# Patient Record
Sex: Female | Born: 1935 | Race: White | Hispanic: No | Smoking: Never smoker
Health system: Southern US, Community
[De-identification: ages and names within clinical notes are randomized; demographics above are authoritative.]

## PROBLEM LIST (undated history)

## (undated) DIAGNOSIS — F0390 Unspecified dementia without behavioral disturbance: Secondary | ICD-10-CM

## (undated) DIAGNOSIS — M8580 Other specified disorders of bone density and structure, unspecified site: Secondary | ICD-10-CM

## (undated) DIAGNOSIS — I639 Cerebral infarction, unspecified: Secondary | ICD-10-CM

## (undated) DIAGNOSIS — E785 Hyperlipidemia, unspecified: Secondary | ICD-10-CM

## (undated) DIAGNOSIS — N390 Urinary tract infection, site not specified: Secondary | ICD-10-CM

## (undated) DIAGNOSIS — N189 Chronic kidney disease, unspecified: Secondary | ICD-10-CM

## (undated) HISTORY — PX: TOTAL HIP ARTHROPLASTY: SHX124

## (undated) HISTORY — PX: REPLACEMENT TOTAL KNEE: SUR1224

## (undated) HISTORY — PX: BREAST LUMPECTOMY: SHX2

## (undated) HISTORY — PX: ABDOMINAL HYSTERECTOMY: SHX81

---

## 2015-05-10 ENCOUNTER — Emergency Department (HOSPITAL_COMMUNITY)
Admission: EM | Admit: 2015-05-10 | Discharge: 2015-05-10 | Disposition: A | Discharge status: Home / Self Care | Payer: Medicare Other | Attending: Emergency Medicine | Admitting: Emergency Medicine

## 2015-05-10 ENCOUNTER — Encounter (HOSPITAL_COMMUNITY): Payer: Self-pay | Admitting: Emergency Medicine

## 2015-05-10 DIAGNOSIS — Y9289 Other specified places as the place of occurrence of the external cause: Secondary | ICD-10-CM | POA: Insufficient documentation

## 2015-05-10 DIAGNOSIS — Z87440 Personal history of urinary (tract) infections: Secondary | ICD-10-CM | POA: Diagnosis not present

## 2015-05-10 DIAGNOSIS — Y998 Other external cause status: Secondary | ICD-10-CM | POA: Insufficient documentation

## 2015-05-10 DIAGNOSIS — L089 Local infection of the skin and subcutaneous tissue, unspecified: Secondary | ICD-10-CM | POA: Insufficient documentation

## 2015-05-10 DIAGNOSIS — Z23 Encounter for immunization: Secondary | ICD-10-CM | POA: Insufficient documentation

## 2015-05-10 DIAGNOSIS — W19XXXA Unspecified fall, initial encounter: Secondary | ICD-10-CM

## 2015-05-10 DIAGNOSIS — Y9389 Activity, other specified: Secondary | ICD-10-CM | POA: Diagnosis not present

## 2015-05-10 DIAGNOSIS — Z8639 Personal history of other endocrine, nutritional and metabolic disease: Secondary | ICD-10-CM | POA: Diagnosis not present

## 2015-05-10 DIAGNOSIS — W1839XA Other fall on same level, initial encounter: Secondary | ICD-10-CM | POA: Insufficient documentation

## 2015-05-10 DIAGNOSIS — S0001XA Abrasion of scalp, initial encounter: Secondary | ICD-10-CM | POA: Diagnosis not present

## 2015-05-10 DIAGNOSIS — S20212A Contusion of left front wall of thorax, initial encounter: Secondary | ICD-10-CM

## 2015-05-10 DIAGNOSIS — Z8679 Personal history of other diseases of the circulatory system: Secondary | ICD-10-CM | POA: Insufficient documentation

## 2015-05-10 DIAGNOSIS — S0990XA Unspecified injury of head, initial encounter: Secondary | ICD-10-CM | POA: Diagnosis present

## 2015-05-10 HISTORY — DX: Urinary tract infection, site not specified: N39.0

## 2015-05-10 HISTORY — DX: Hyperlipidemia, unspecified: E78.5

## 2015-05-10 HISTORY — DX: Cerebral infarction, unspecified: I63.9

## 2015-05-10 MED ORDER — TETANUS-DIPHTH-ACELL PERTUSSIS 5-2.5-18.5 LF-MCG/0.5 IM SUSP
0.5000 mL | Freq: Once | INTRAMUSCULAR | Status: AC
  Administered 2015-05-10: 0.5 mL via INTRAMUSCULAR
  Filled 2015-05-10: qty 0.5

## 2015-05-10 NOTE — ED Provider Notes (Signed)
CSN: [id number]     Arrival date & time 05/10/15  1641 History   First MD Initiated Contact with Patient 05/10/15 1701     Chief Complaint  Patient presents with  . Fall     (Consider location/radiation/quality/duration/timing/severity/associated sxs/prior Treatment) HPI   Victoria Walsh is a 81 y.o. female who was standing, making coffee when she lost her balance and fell backwards into her wheelchair. She injured her head and left side when she fell. She has a chronic balance disorder which is why she has a wheelchair ready, and walks with a brace on her right leg. She summoned help and was transferred here by EMS. She did not lose consciousness. She took her usual medications today. She is not having recent trouble eating, fever, chills, shortness of breath or cough. There are no other known modifying factors.   Past Medical History  Diagnosis Date  . CVA (cerebral infarction)   . Hyperlipidemia   . UTI (lower urinary tract infection)    Past Surgical History  Procedure Laterality Date  . Abdominal hysterectomy    . Breast lumpectomy    . Replacement total knee    . Total hip arthroplasty     No family history on file. Social History  Substance Use Topics  . Smoking status: Never Smoker   . Smokeless tobacco: None  . Alcohol Use: Yes     Comment: socially   OB History    No data available     Review of Systems  All other systems reviewed and are negative.     Allergies  Review of patient's allergies indicates not on file.  Home Medications   Prior to Admission medications   Not on File   BP 135/76 mmHg  Pulse 63  Temp(Src) 98.1 F (36.7 C) (Oral)  Resp 16  Ht 5\' 8"  (1.727 m)  Wt 180 lb (81.647 kg)  BMI 27.38 kg/m2  SpO2 99% Physical Exam  Constitutional: She is oriented to person, place, and time. She appears well-developed.  Elderly, frail  HENT:  Head: Normocephalic.  Right Ear: External ear normal.  Left Ear: External ear normal.  Superficial  abrasion behind left ear, bleeding somewhat. No associated crepitation or deformity.  Eyes: Conjunctivae and EOM are normal. Pupils are equal, round, and reactive to light.  Neck: Normal range of motion and phonation normal. Neck supple.  Cardiovascular: Normal rate, regular rhythm and normal heart sounds.   Pulmonary/Chest: Effort normal and breath sounds normal. She exhibits no bony tenderness.  Tiny contusion, left lateral lower chest wall without associated rib crepitation or deformity.  Abdominal: Soft. There is no tenderness.  Musculoskeletal: Normal range of motion.  Neurological: She is alert and oriented to person, place, and time. No cranial nerve deficit or sensory deficit. She exhibits normal muscle tone. Coordination normal.  Skin: Skin is warm, dry and intact.  Psychiatric: She has a normal mood and affect. Her behavior is normal.  Nursing note and vitals reviewed.   ED Course  Procedures (including critical care time) Labs Review Labs Reviewed - No data to display  Imaging Review No results found. I have personally reviewed and evaluated these images and lab results as part of my medical decision-making.   EKG Interpretation   Date/Time:  Saturday May 10 2015 16:45:54 EDT Ventricular Rate:  63 PR Interval:  150 QRS Duration: 90 QT Interval:  432 QTC Calculation: 442 R Axis:   -14 Text Interpretation:  Sinus rhythm Abnormal R-wave progression, early  transition Inferior infarct, old No old tracing to compare Confirmed by  [facility]  MD, [place] [phone]) on 05/10/2015 5:06:54 PM      MDM   Final diagnoses:  Fall, initial encounter  Abrasion, scalp with infection, initial encounter  Contusion, chest wall, left, initial encounter    Protected  fall, into wheelchair, without serious injury. Suspect balance disorder which is chronic as the etiology. Doubt serous bacterial infection. Metabolic instability or impending vascular collapse  Nursing Notes Reviewed/  Care Coordinated Applicable Imaging Reviewed Interpretation of Laboratory Data incorporated into ED treatment  The patient appears reasonably screened and/or stabilized for discharge and I doubt any other medical condition or other [facility] requiring further screening, evaluation, or treatment in the ED at this time prior to discharge.  Plan: Home Medications- usual; Home Treatments- rest; return here if the recommended treatment, does not improve the symptoms; Recommended follow up- PCP prn     Daleen Bo, MD 05/10/15 1900

## 2015-05-10 NOTE — ED Notes (Signed)
To ED via [place] from [facility], with c/o fall-- lost balance, fell into wheelchair. "I was standing in front of wheelchair and just lost balance, I fell backwards into the wheelchair, with handle poking me in left side"  Denies LOC, has small abrasion to left side of head. Bruising/reddened area on left abd area.

## 2015-05-10 NOTE — Discharge Instructions (Signed)

## 2015-06-03 ENCOUNTER — Emergency Department (HOSPITAL_COMMUNITY)
Admission: EM | Admit: 2015-06-03 | Discharge: 2015-06-03 | Disposition: A | Discharge status: Home / Self Care | Payer: Medicare Other | Attending: Emergency Medicine | Admitting: Emergency Medicine

## 2015-06-03 ENCOUNTER — Encounter (HOSPITAL_COMMUNITY): Payer: Self-pay | Admitting: Family Medicine

## 2015-06-03 ENCOUNTER — Emergency Department (HOSPITAL_COMMUNITY): Payer: Medicare Other

## 2015-06-03 DIAGNOSIS — Z792 Long term (current) use of antibiotics: Secondary | ICD-10-CM | POA: Insufficient documentation

## 2015-06-03 DIAGNOSIS — S0993XA Unspecified injury of face, initial encounter: Secondary | ICD-10-CM | POA: Diagnosis present

## 2015-06-03 DIAGNOSIS — Z87440 Personal history of urinary (tract) infections: Secondary | ICD-10-CM | POA: Insufficient documentation

## 2015-06-03 DIAGNOSIS — N189 Chronic kidney disease, unspecified: Secondary | ICD-10-CM | POA: Insufficient documentation

## 2015-06-03 DIAGNOSIS — Z8639 Personal history of other endocrine, nutritional and metabolic disease: Secondary | ICD-10-CM | POA: Diagnosis not present

## 2015-06-03 DIAGNOSIS — Y998 Other external cause status: Secondary | ICD-10-CM | POA: Diagnosis not present

## 2015-06-03 DIAGNOSIS — F0390 Unspecified dementia without behavioral disturbance: Secondary | ICD-10-CM | POA: Diagnosis not present

## 2015-06-03 DIAGNOSIS — W1839XA Other fall on same level, initial encounter: Secondary | ICD-10-CM | POA: Insufficient documentation

## 2015-06-03 DIAGNOSIS — Y9389 Activity, other specified: Secondary | ICD-10-CM | POA: Diagnosis not present

## 2015-06-03 DIAGNOSIS — S0083XA Contusion of other part of head, initial encounter: Secondary | ICD-10-CM | POA: Insufficient documentation

## 2015-06-03 DIAGNOSIS — Z8739 Personal history of other diseases of the musculoskeletal system and connective tissue: Secondary | ICD-10-CM | POA: Diagnosis not present

## 2015-06-03 DIAGNOSIS — Z8673 Personal history of transient ischemic attack (TIA), and cerebral infarction without residual deficits: Secondary | ICD-10-CM | POA: Insufficient documentation

## 2015-06-03 DIAGNOSIS — Z79899 Other long term (current) drug therapy: Secondary | ICD-10-CM | POA: Diagnosis not present

## 2015-06-03 DIAGNOSIS — Y92128 Other place in nursing home as the place of occurrence of the external cause: Secondary | ICD-10-CM | POA: Diagnosis not present

## 2015-06-03 DIAGNOSIS — W19XXXA Unspecified fall, initial encounter: Secondary | ICD-10-CM

## 2015-06-03 HISTORY — DX: Chronic kidney disease, unspecified: N18.9

## 2015-06-03 HISTORY — DX: Unspecified dementia, unspecified severity, without behavioral disturbance, psychotic disturbance, mood disturbance, and anxiety: F03.90

## 2015-06-03 HISTORY — DX: Other specified disorders of bone density and structure, unspecified site: M85.80

## 2015-06-03 NOTE — ED Provider Notes (Signed)
CSN: [id number]     Arrival date & time 06/03/15  2017 History   First MD Initiated Contact with Patient 06/03/15 2020     Chief Complaint  Patient presents with  . Fall  . Hematoma to forehead     (Consider location/radiation/quality/duration/timing/severity/associated sxs/prior Treatment) HPI  81 year old female presents from a nursing home after a fall in her room. She was found laying on her back in her room just outside her bathroom. Patient tells me that she was in her wheelchair and tried to reach her sweater but feels like she lost her balance and fell and landed on her head. She did not lose consciousness. There was never dizziness/lightheadedness, chest pain, or shortness of breath. She denies headaches. She is on Xarelto. Patient has chronic left-sided weakness from her previous stroke, not worse than typical now. No neck pain.  Past Medical History  Diagnosis Date  . CVA (cerebral infarction)   . Hyperlipidemia   . UTI (lower urinary tract infection)   . Osteopenia   . Dementia   . Chronic kidney disease    Past Surgical History  Procedure Laterality Date  . Abdominal hysterectomy    . Breast lumpectomy    . Replacement total knee    . Total hip arthroplasty     History reviewed. No pertinent family history. Social History  Substance Use Topics  . Smoking status: Never Smoker   . Smokeless tobacco: None  . Alcohol Use: Yes     Comment: socially: Once every month   OB History    No data available     Review of Systems  Gastrointestinal: Negative for vomiting.  Skin: Negative for wound.  Neurological: Negative for weakness, numbness and headaches.  All other systems reviewed and are negative.     Allergies  Ciprofloxacin; Oxaprozin; and Sulfa antibiotics  Home Medications   Prior to Admission medications   Medication Sig Start Date End Date Taking? Authorizing Provider  acetaminophen (TYLENOL) 325 MG tablet Take 325 mg by mouth 2 (two) times daily.     Historical Provider, MD  brimonidine-timolol (COMBIGAN) 0.2-0.5 % ophthalmic solution Place 1 drop into the left eye every 12 (twelve) hours.    Historical Provider, MD  brinzolamide (AZOPT) 1 % ophthalmic suspension Place 1 drop into the left eye 2 (two) times daily.    Historical Provider, MD  Calcium Carb-Cholecalciferol (CALCIUM-VITAMIN D) 500-400 MG-UNIT TABS Take 1 tablet by mouth at bedtime.     Historical Provider, MD  cephALEXin (KEFLEX) 250 MG capsule Take 250 mg by mouth daily. for 90 days    Historical Provider, MD  Cranberry 450 MG CAPS Take 450 mg by mouth 2 (two) times daily.    Historical Provider, MD  DULoxetine (CYMBALTA) 30 MG capsule Take 30 mg by mouth daily.    Historical Provider, MD  famotidine (PEPCID AC) 10 MG tablet Take 10 mg by mouth 2 (two) times daily.    Historical Provider, MD  gabapentin (NEURONTIN) 100 MG capsule Take 200 mg by mouth at bedtime.    Historical Provider, MD  latanoprost (XALATAN) 0.005 % ophthalmic solution Place 1 drop into the left eye at bedtime.    Historical Provider, MD  letrozole (FEMARA) 2.5 MG tablet Take 2.5 mg by mouth daily.    Historical Provider, MD  levothyroxine (SYNTHROID, LEVOTHROID) 112 MCG tablet Take 112 mcg by mouth daily before breakfast.    Historical Provider, MD  Multiple Vitamins-Iron (MULTIVITAMINS WITH IRON) TABS tablet Take 1 tablet by  mouth at bedtime.    Historical Provider, MD  polyethylene glycol (MIRALAX / GLYCOLAX) packet Take 17 g by mouth daily as needed for moderate constipation.    Historical Provider, MD  pregabalin (LYRICA) 25 MG capsule Take 25 mg by mouth 2 (two) times daily.    Historical Provider, MD  rivaroxaban (XARELTO) 20 MG TABS tablet Take 20 mg by mouth daily with supper.    Historical Provider, MD  traMADol (ULTRAM) 50 MG tablet Take 50 mg by mouth 2 (two) times daily.    Historical Provider, MD   BP 154/65 mmHg  Pulse 55  Temp(Src) 97.6 F (36.4 C) (Oral)  Resp 18  Ht 5\' 8"  (1.727 m)   Wt 180 lb (81.647 kg)  BMI 27.38 kg/m2  SpO2 100% Physical Exam  Constitutional: She is oriented to person, place, and time. She appears well-developed and well-nourished.  HENT:  Head: Normocephalic. Head is with contusion.    Right Ear: External ear normal.  Left Ear: External ear normal.  Nose: Nose normal.  Eyes: EOM are normal. Pupils are equal, round, and reactive to light. Right eye exhibits no discharge. Left eye exhibits no discharge.  Cardiovascular: Normal rate, regular rhythm and normal heart sounds.   Pulmonary/Chest: Effort normal and breath sounds normal.  Abdominal: Soft. She exhibits no distension. There is no tenderness.  Neurological: She is alert and oriented to person, place, and time.  Alert and oriented to self, place, time and situation. CN 2-12 grossly intact. 5/5 strength in right sided extremities, 4/5 in left sided extremities. Grossly normal sensation.   Skin: Skin is warm and dry.  Nursing note and vitals reviewed.   ED Course  Procedures (including critical care time) Labs Review Labs Reviewed - No data to display  Imaging Review Ct Head Wo Contrast  06/03/2015  CLINICAL DATA:  Unwitnessed fall with left temple injury and hematoma. History of previous CVA. EXAM: CT HEAD WITHOUT CONTRAST TECHNIQUE: Contiguous axial images were obtained from the base of the skull through the vertex without intravenous contrast. COMPARISON:  None. FINDINGS: Large areas of encephalomalacia in the right MCA distribution, sequela of prior infarct. Lacunar infarcts in the left basal ganglia and right cerebellum. There is moderate background chronic small vessel ischemic change. No intracranial hemorrhage, mass effect, or midline shift. No hydrocephalus, mild ex vacuo dilatation of the right lateral ventricle. The basilar cisterns are patent. No evidence of acute ischemia. No intracranial fluid collection. Calvarium is intact. Soft tissue hematoma not well seen. Included paranasal  sinuses and mastoid air cells are well aerated. IMPRESSION: 1.  No acute abnormality. 2. Multifocal remote infarcts involving the right MCA distribution, left basal ganglia and right cerebellum. Electronically Signed   By: Jeb Levering M.D.   On: 06/03/2015 21:09   I have personally reviewed and evaluated these images and lab results as part of my medical decision-making.   EKG Interpretation None      MDM   Final diagnoses:  Fall, initial encounter  Contusion of temple region, initial encounter    Patient's neuro exam is at her baseline. Mild contusion to her left temple but CT scan is unremarkable. She appears at her mental baseline. This appears to be a mechanical fall, no concern for syncope or near-syncope. Plan to discharge back to facility with return precautions.    Sherwood Gambler, MD 06/03/15 2128

## 2015-06-03 NOTE — ED Notes (Signed)
Patient arrives by EMS from [facility] on [address].  EMS states unwitnessed fall, found supine outside of her bathroom, small hematoma to left temple area.  EMS states patient alert and oriented, only complaining of the bump on her head.  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

## 2015-06-03 NOTE — Discharge Instructions (Signed)
Facial or Scalp Contusion A facial or scalp contusion is a deep bruise on the face or head. Injuries to the face and head generally cause a lot of swelling, especially around the eyes. Contusions are the result of an injury that caused bleeding under the skin. The contusion may turn blue, purple, or yellow. Minor injuries will give you a painless contusion, but more severe contusions may stay painful and swollen for a few weeks.  CAUSES  A facial or scalp contusion is caused by a blunt injury or trauma to the face or head area.  SIGNS AND SYMPTOMS   Swelling of the injured area.   Discoloration of the injured area.   Tenderness, soreness, or pain in the injured area.  DIAGNOSIS  The diagnosis can be made by taking a medical history and doing a physical exam. An X-ray exam, CT scan, or MRI may be needed to determine if there are any associated injuries, such as broken bones (fractures). TREATMENT  Often, the best treatment for a facial or scalp contusion is applying cold compresses to the injured area. Over-the-counter medicines may also be recommended for pain control.  HOME CARE INSTRUCTIONS   Only take over-the-counter or prescription medicines as directed by your health care provider.   Apply ice to the injured area.   Put ice in a plastic bag.   Place a towel between your skin and the bag.   Leave the ice on for 20 minutes, 2-3 times a day.  SEEK MEDICAL CARE IF:  You have bite problems.   You have pain with chewing.   You are concerned about facial defects. SEEK IMMEDIATE MEDICAL CARE IF:  You have severe pain or a headache that is not relieved by medicine.   You have unusual sleepiness, confusion, or personality changes.   You throw up (vomit).   You have a persistent nosebleed.   You have double vision or blurred vision.   You have fluid drainage from your nose or ear.   You have difficulty walking or using your arms or legs.  MAKE SURE YOU:    Understand these instructions.  Will watch your condition.  Will get help right away if you are not doing well or get worse.   This information is not intended to replace advice given to you by your health care provider. Make sure you discuss any questions you have with your health care provider.   Document Released: 09/09/2004 Document Revised: 08/23/2014 Document Reviewed: 03/15/2013 Elsevier Interactive Patient Education 2016 [place] Injury, Adult You have received a head injury. It does not appear serious at this time. Headaches and vomiting are common following head injury. It should be easy to awaken from sleeping. Sometimes it is necessary for you to stay in the emergency department for a while for observation. Sometimes admission to the hospital may be needed. After injuries such as yours, most problems occur within the first 24 hours, but side effects may occur up to 7-10 days after the injury. It is important for you to carefully monitor your condition and contact your health care provider or seek immediate medical care if there is a change in your condition. WHAT ARE THE TYPES OF HEAD INJURIES? Head injuries can be as minor as a bump. Some head injuries can be more severe. More severe head injuries include:  A jarring injury to the brain (concussion).  A bruise of the brain (contusion). This mean there is bleeding in the brain that  can cause swelling.  A cracked skull (skull fracture).  Bleeding in the brain that collects, clots, and forms a bump (hematoma). WHAT CAUSES A HEAD INJURY? A serious head injury is most likely to happen to someone who is in a car wreck and is not wearing a seat belt. Other causes of major head injuries include bicycle or motorcycle accidents, sports injuries, and falls. HOW ARE HEAD INJURIES DIAGNOSED? A complete history of the event leading to the injury and your current symptoms will be helpful in diagnosing head injuries. Many  times, pictures of the brain, such as CT or MRI are needed to see the extent of the injury. Often, an overnight hospital stay is necessary for observation.  WHEN SHOULD I SEEK IMMEDIATE MEDICAL CARE?  You should get help right away if:  You have confusion or drowsiness.  You feel sick to your stomach (nauseous) or have continued, forceful vomiting.  You have dizziness or unsteadiness that is getting worse.  You have severe, continued headaches not relieved by medicine. Only take over-the-counter or prescription medicines for pain, fever, or discomfort as directed by your health care provider.  You do not have normal function of the arms or legs or are unable to walk.  You notice changes in the black spots in the center of the colored part of your eye (pupil).  You have a clear or bloody fluid coming from your nose or ears.  You have a loss of vision. During the next 24 hours after the injury, you must stay with someone who can watch you for the warning signs. This person should contact local emergency services (911 in the [place] if you have seizures, you become unconscious, or you are unable to wake up. HOW CAN I PREVENT A HEAD INJURY IN THE FUTURE? The most important factor for preventing major head injuries is avoiding motor vehicle accidents. To minimize the potential for damage to your head, it is crucial to wear seat belts while riding in motor vehicles. Wearing helmets while bike riding and playing collision sports (like football) is also helpful. Also, avoiding dangerous activities around the house will further help reduce your risk of head injury.  WHEN CAN I RETURN TO NORMAL ACTIVITIES AND ATHLETICS? You should be reevaluated by your health care provider before returning to these activities. If you have any of the following symptoms, you should not return to activities or contact sports until 1 week after the symptoms have stopped:  Persistent headache.  Dizziness or  vertigo.  Poor attention and concentration.  Confusion.  Memory problems.  Nausea or vomiting.  Fatigue or tire easily.  Irritability.  Intolerant of bright lights or loud noises.  Anxiety or depression.  Disturbed sleep. MAKE SURE YOU:   Understand these instructions.  Will watch your condition.  Will get help right away if you are not doing well or get worse.   This information is not intended to replace advice given to you by your health care provider. Make sure you discuss any questions you have with your health care provider.   Document Released: 08/02/2005 Document Revised: 08/23/2014 Document Reviewed: 04/09/2013 Elsevier Interactive Patient Education [employer].

## 2015-06-03 NOTE — ED Notes (Signed)
Bed: HF02 Expected date:  Expected time:  Means of arrival:  Comments: EMS elderly fall, unwitnessed-hematoma back of head-from [employer]

## 2015-12-26 ENCOUNTER — Emergency Department (HOSPITAL_COMMUNITY)
Admission: EM | Admit: 2015-12-26 | Discharge: 2015-12-26 | Disposition: A | Discharge status: Home / Self Care | Payer: Medicare Other | Attending: Emergency Medicine | Admitting: Emergency Medicine

## 2015-12-26 ENCOUNTER — Encounter (HOSPITAL_COMMUNITY): Payer: Self-pay | Admitting: *Deleted

## 2015-12-26 DIAGNOSIS — N390 Urinary tract infection, site not specified: Secondary | ICD-10-CM | POA: Diagnosis not present

## 2015-12-26 DIAGNOSIS — F0390 Unspecified dementia without behavioral disturbance: Secondary | ICD-10-CM | POA: Insufficient documentation

## 2015-12-26 DIAGNOSIS — R112 Nausea with vomiting, unspecified: Secondary | ICD-10-CM | POA: Diagnosis present

## 2015-12-26 DIAGNOSIS — Z79899 Other long term (current) drug therapy: Secondary | ICD-10-CM | POA: Insufficient documentation

## 2015-12-26 DIAGNOSIS — R197 Diarrhea, unspecified: Secondary | ICD-10-CM | POA: Insufficient documentation

## 2015-12-26 DIAGNOSIS — R1110 Vomiting, unspecified: Secondary | ICD-10-CM

## 2015-12-26 DIAGNOSIS — N189 Chronic kidney disease, unspecified: Secondary | ICD-10-CM | POA: Insufficient documentation

## 2015-12-26 DIAGNOSIS — Z8673 Personal history of transient ischemic attack (TIA), and cerebral infarction without residual deficits: Secondary | ICD-10-CM | POA: Insufficient documentation

## 2015-12-26 LAB — COMPREHENSIVE METABOLIC PANEL
ALBUMIN: 4 g/dL (ref 3.5–5.0)
ALK PHOS: 68 U/L (ref 38–126)
ALT: 16 U/L (ref 14–54)
ANION GAP: 10 (ref 5–15)
AST: 24 U/L (ref 15–41)
BILIRUBIN TOTAL: 0.7 mg/dL (ref 0.3–1.2)
BUN: 21 mg/dL — ABNORMAL HIGH (ref 6–20)
CALCIUM: 9.4 mg/dL (ref 8.9–10.3)
CO2: 25 mmol/L (ref 22–32)
Chloride: 107 mmol/L (ref 101–111)
Creatinine, Ser: 1.04 mg/dL — ABNORMAL HIGH (ref 0.44–1.00)
GFR calc Af Amer: 57 mL/min — ABNORMAL LOW (ref >60)
GFR, EST NON AFRICAN AMERICAN: 49 mL/min — AB (ref >60)
GLUCOSE: 144 mg/dL — AB (ref 65–99)
POTASSIUM: 4.3 mmol/L (ref 3.5–5.1)
SODIUM: 142 mmol/L (ref 135–145)
Total Protein: 6.5 g/dL (ref 6.5–8.1)

## 2015-12-26 LAB — CBC WITH DIFFERENTIAL/PLATELET
BASOS ABS: 0 10*3/uL (ref 0.0–0.1)
BASOS PCT: 0 %
EOS ABS: 0 10*3/uL (ref 0.0–0.7)
Eosinophils Relative: 0 %
HCT: 38.1 % (ref 36.0–46.0)
Hemoglobin: 12.2 g/dL (ref 12.0–15.0)
LYMPHS ABS: 0.6 10*3/uL — AB (ref 0.7–4.0)
LYMPHS PCT: 7 %
MCH: 30.4 pg (ref 26.0–34.0)
MCHC: 32 g/dL (ref 30.0–36.0)
MCV: 95 fL (ref 78.0–100.0)
MONOS PCT: 2 %
Monocytes Absolute: 0.2 10*3/uL (ref 0.1–1.0)
NEUTROS PCT: 91 %
Neutro Abs: 8.4 10*3/uL — ABNORMAL HIGH (ref 1.7–7.7)
PLATELETS: 173 10*3/uL (ref 150–400)
RBC: 4.01 MIL/uL (ref 3.87–5.11)
RDW: 12.5 % (ref 11.5–15.5)
WBC: 9.2 10*3/uL (ref 4.0–10.5)

## 2015-12-26 LAB — URINALYSIS, ROUTINE W REFLEX MICROSCOPIC
BILIRUBIN URINE: NEGATIVE
GLUCOSE, UA: NEGATIVE mg/dL
Ketones, ur: NEGATIVE mg/dL
Nitrite: POSITIVE — AB
PROTEIN: NEGATIVE mg/dL
SPECIFIC GRAVITY, URINE: 1.01 (ref 1.005–1.030)
pH: 7.5 (ref 5.0–8.0)

## 2015-12-26 LAB — URINE MICROSCOPIC-ADD ON: Squamous Epithelial / LPF: NONE SEEN

## 2015-12-26 LAB — I-STAT TROPONIN, ED: Troponin i, poc: 0.01 ng/mL (ref 0.00–0.08)

## 2015-12-26 LAB — LIPASE, BLOOD: LIPASE: 67 U/L — AB (ref 11–51)

## 2015-12-26 MED ORDER — DEXTROSE 5 % IV SOLN
1.0000 g | Freq: Once | INTRAVENOUS | Status: AC
  Administered 2015-12-26: 1 g via INTRAVENOUS
  Filled 2015-12-26: qty 10

## 2015-12-26 MED ORDER — ACETAMINOPHEN 325 MG PO TABS
650.0000 mg | ORAL_TABLET | Freq: Once | ORAL | Status: AC
  Administered 2015-12-26: 650 mg via ORAL
  Filled 2015-12-26: qty 2

## 2015-12-26 MED ORDER — ONDANSETRON HCL 4 MG/2ML IJ SOLN
4.0000 mg | Freq: Once | INTRAMUSCULAR | Status: AC
  Administered 2015-12-26: 4 mg via INTRAVENOUS
  Filled 2015-12-26: qty 2

## 2015-12-26 MED ORDER — CEPHALEXIN 250 MG PO CAPS: 250.0000 mg | ORAL_CAPSULE | Freq: Four times a day (QID) | ORAL | Status: DC

## 2015-12-26 MED ORDER — ONDANSETRON HCL 4 MG PO TABS: 4.0000 mg | ORAL_TABLET | Freq: Four times a day (QID) | ORAL | Status: DC

## 2015-12-26 MED ORDER — SODIUM CHLORIDE 0.9 % IV BOLUS (SEPSIS)
1000.0000 mL | Freq: Once | INTRAVENOUS | Status: AC
  Administered 2015-12-26: 1000 mL via INTRAVENOUS

## 2015-12-26 NOTE — ED Notes (Signed)
NAD at this time. Pt is stable and going to [facility] .

## 2015-12-26 NOTE — ED Provider Notes (Addendum)
CSN: [id number]     Arrival date & time 12/26/15  [id number] History   First MD Initiated Contact with Patient 12/26/15 [phone]     Chief Complaint  Patient presents with  . Nausea  . Emesis  . Diarrhea     (Consider location/radiation/quality/duration/timing/severity/associated sxs/prior Treatment) Patient is a 82 y.o. female presenting with vomiting and diarrhea. The history is provided by the patient, the EMS personnel and the nursing home.  Emesis Severity:  Moderate Duration:  6 hours Timing:  Constant Number of daily episodes:  Numerous Quality:  Stomach contents Progression:  Unchanged Chronicity:  New Urine output: catheter so does not know. Relieved by:  None tried Worsened by:  Nothing tried Ineffective treatments:  None tried Associated symptoms: abdominal pain, diarrhea and headaches   Associated symptoms: no fever   Abdominal pain:    Location:  Epigastric   Quality:  Aching   Severity:  Moderate   Onset quality:  Gradual   Timing:  Constant   Progression:  Unchanged   Chronicity:  New Risk factors: no alcohol use, no diabetes, no prior abdominal surgery, no suspect food intake and no travel to endemic areas   Risk factors comment:  Patient lives at [facility] and is unsure if there been other people who are ill Diarrhea Associated symptoms: abdominal pain, headaches and vomiting     Past Medical History  Diagnosis Date  . CVA (cerebral infarction)   . Hyperlipidemia   . UTI (lower urinary tract infection)   . Osteopenia   . Dementia   . Chronic kidney disease    Past Surgical History  Procedure Laterality Date  . Abdominal hysterectomy    . Breast lumpectomy    . Replacement total knee    . Total hip arthroplasty     No family history on file. Social History  Substance Use Topics  . Smoking status: Never Smoker   . Smokeless tobacco: None  . Alcohol Use: Yes     Comment: socially: Once every month   OB History    No data available      Review of Systems  Gastrointestinal: Positive for vomiting, abdominal pain and diarrhea.  Neurological: Positive for headaches.  All other systems reviewed and are negative.     Allergies  Ciprofloxacin; Oxaprozin; and Sulfa antibiotics  Home Medications   Prior to Admission medications   Medication Sig Start Date End Date Taking? Authorizing Provider  acetaminophen (TYLENOL) 325 MG tablet Take 325 mg by mouth 2 (two) times daily.    Historical Provider, MD  brimonidine-timolol (COMBIGAN) 0.2-0.5 % ophthalmic solution Place 1 drop into the left eye every 12 (twelve) hours.    Historical Provider, MD  brinzolamide (AZOPT) 1 % ophthalmic suspension Place 1 drop into the left eye 2 (two) times daily.    Historical Provider, MD  Calcium Carb-Cholecalciferol (CALCIUM-VITAMIN D) 500-400 MG-UNIT TABS Take 1 tablet by mouth at bedtime.     Historical Provider, MD  cephALEXin (KEFLEX) 250 MG capsule Take 250 mg by mouth daily. for 90 days    Historical Provider, MD  Cranberry 450 MG CAPS Take 450 mg by mouth 2 (two) times daily.    Historical Provider, MD  DULoxetine (CYMBALTA) 30 MG capsule Take 30 mg by mouth daily.    Historical Provider, MD  famotidine (PEPCID AC) 10 MG tablet Take 10 mg by mouth 2 (two) times daily.    Historical Provider, MD  gabapentin (NEURONTIN) 100 MG capsule Take 200 mg by  mouth at bedtime.    Historical Provider, MD  latanoprost (XALATAN) 0.005 % ophthalmic solution Place 1 drop into the left eye at bedtime.    Historical Provider, MD  letrozole (FEMARA) 2.5 MG tablet Take 2.5 mg by mouth daily.    Historical Provider, MD  levothyroxine (SYNTHROID, LEVOTHROID) 112 MCG tablet Take 112 mcg by mouth daily before breakfast.    Historical Provider, MD  Multiple Vitamins-Iron (MULTIVITAMINS WITH IRON) TABS tablet Take 1 tablet by mouth at bedtime.    Historical Provider, MD  polyethylene glycol (MIRALAX / GLYCOLAX) packet Take 17 g by mouth daily as needed for moderate  constipation.    Historical Provider, MD  pregabalin (LYRICA) 25 MG capsule Take 25 mg by mouth 2 (two) times daily.    Historical Provider, MD  rivaroxaban (XARELTO) 20 MG TABS tablet Take 20 mg by mouth daily with supper.    Historical Provider, MD  traMADol (ULTRAM) 50 MG tablet Take 50 mg by mouth 2 (two) times daily.    Historical Provider, MD   BP 144/82 mmHg  Pulse 61  Temp(Src) 97.7 F (36.5 C) (Oral)  Resp 15  SpO2 100% Physical Exam  Constitutional: She appears well-developed and well-nourished. No distress.  HENT:  Head: Normocephalic and atraumatic.  Mouth/Throat: Mucous membranes are dry.  Eyes: EOM are normal. Pupils are equal, round, and reactive to light.  Cardiovascular: Normal rate, regular rhythm, normal heart sounds and intact distal pulses.  Exam reveals no friction rub.   No murmur heard. Pulmonary/Chest: Effort normal and breath sounds normal. She has no wheezes. She has no rales.  Abdominal: Soft. Bowel sounds are normal. She exhibits no distension. There is tenderness. There is no rebound and no guarding.  Minimal epigastric tenderness  Musculoskeletal: Normal range of motion. She exhibits no tenderness.  No edema  Neurological: She is alert. No cranial nerve deficit.  Oriented to person and place  Skin: Skin is warm and dry. No rash noted.  Psychiatric: She has a normal mood and affect. Her behavior is normal.  Nursing note and vitals reviewed.   ED Course  Procedures (including critical care time) Labs Review Labs Reviewed  CBC WITH DIFFERENTIAL/PLATELET - Abnormal; Notable for the following:    Neutro Abs 8.4 (*)    Lymphs Abs 0.6 (*)    All other components within normal limits  COMPREHENSIVE METABOLIC PANEL - Abnormal; Notable for the following:    Glucose, Bld 144 (*)    BUN 21 (*)    Creatinine, Ser 1.04 (*)    GFR calc non Af Amer 49 (*)    GFR calc Af Amer 57 (*)    All other components within normal limits  LIPASE, BLOOD - Abnormal;  Notable for the following:    Lipase 67 (*)    All other components within normal limits  URINALYSIS, ROUTINE W REFLEX MICROSCOPIC (NOT AT [facility]) - Abnormal; Notable for the following:    APPearance CLOUDY (*)    Hgb urine dipstick SMALL (*)    Nitrite POSITIVE (*)    Leukocytes, UA LARGE (*)    All other components within normal limits  URINE MICROSCOPIC-ADD ON - Abnormal; Notable for the following:    Bacteria, UA FEW (*)    All other components within normal limits  URINE CULTURE  I-STAT TROPOININ, ED    Imaging Review No results found. I have personally reviewed and evaluated these images and lab results as part of my medical decision-making.   EKG  Interpretation   Date/Time:  Friday Dec 26 2015 10:23:02 EDT Ventricular Rate:  61 PR Interval:  149 QRS Duration: 104 QT Interval:  459 QTC Calculation: 462 R Axis:     Text Interpretation:  Sinus rhythm Posterior infarct, old Baseline wander  in lead(s) V6 No significant change since last tracing Confirmed by  [facility]  MD, Loree Fee ([id number]) on 12/26/2015 11:04:15 AM      MDM   Final diagnoses:  UTI (lower urinary tract infection)  Intractable vomiting with nausea, vomiting of unspecified type   Patient is an 82 year old female presenting today with nausea, vomiting and diarrhea that started abruptly at 4 AM this morning. Patient is complaining of upper gastric abdominal pain and continued nausea but no other complaints. Unclear if there've been other sick contacts but patient has not recently been taking any antibiotics.  She is awake and alert and vital signs are within normal limits.  CBC, CMP, lipase, troponin, EKG pending. Patient given IV fluids and Zofran.  2:11 PM Labs are consistent with a normal CBC, CMP without acute findings. UA with concern for infection with positive nitrites, large leukocytes and too numerous to count white blood cells. Patient does have an indwelling Foley catheter and unknown when it was  last changed. We'll change patient's Foley catheter, urine culture pending and start antibiotics. After fluids and Zofran patient feels much better. We will by mouth challenge and if tolerating fluids will DC home with an antibiotic.  Blanchie Dessert, MD 12/26/15 1412  Blanchie Dessert, MD 12/26/15 1708

## 2015-12-26 NOTE — ED Notes (Signed)
Per Pt and PTAR Pt ate dinner at 6:00pm. She felt fine.  She woke up at 4:00am and has been vomiting since.  Pt is now vomiting yellow bile.  She did have a few episodes of diarrhea also.  She is weak.  VS per EMS are as follows: BP: 122/64 HR: 64 SPO2: 98%

## 2015-12-26 NOTE — ED Notes (Signed)
Pt already has indwelling urinary cath in place.  Leg bag taken off pt and bedside drainage bag placed on side of bed and connected to cath.  Waiting on enough urine to collect sample.

## 2015-12-27 LAB — URINE CULTURE

## 2017-05-13 ENCOUNTER — Emergency Department (HOSPITAL_COMMUNITY): Payer: Medicare Other

## 2017-05-13 ENCOUNTER — Encounter (HOSPITAL_COMMUNITY): Payer: Self-pay

## 2017-05-13 ENCOUNTER — Emergency Department (HOSPITAL_COMMUNITY)
Admission: EM | Admit: 2017-05-13 | Discharge: 2017-05-13 | Disposition: A | Discharge status: Home / Self Care | Payer: Medicare Other | Attending: Emergency Medicine | Admitting: Emergency Medicine

## 2017-05-13 DIAGNOSIS — S01312A Laceration without foreign body of left ear, initial encounter: Secondary | ICD-10-CM | POA: Diagnosis not present

## 2017-05-13 DIAGNOSIS — I69354 Hemiplegia and hemiparesis following cerebral infarction affecting left non-dominant side: Secondary | ICD-10-CM | POA: Insufficient documentation

## 2017-05-13 DIAGNOSIS — Z96649 Presence of unspecified artificial hip joint: Secondary | ICD-10-CM | POA: Insufficient documentation

## 2017-05-13 DIAGNOSIS — Y939 Activity, unspecified: Secondary | ICD-10-CM | POA: Diagnosis not present

## 2017-05-13 DIAGNOSIS — Y92129 Unspecified place in nursing home as the place of occurrence of the external cause: Secondary | ICD-10-CM | POA: Insufficient documentation

## 2017-05-13 DIAGNOSIS — Z79899 Other long term (current) drug therapy: Secondary | ICD-10-CM | POA: Insufficient documentation

## 2017-05-13 DIAGNOSIS — W04XXXA Fall while being carried or supported by other persons, initial encounter: Secondary | ICD-10-CM | POA: Diagnosis not present

## 2017-05-13 DIAGNOSIS — Y999 Unspecified external cause status: Secondary | ICD-10-CM | POA: Diagnosis not present

## 2017-05-13 DIAGNOSIS — Z96659 Presence of unspecified artificial knee joint: Secondary | ICD-10-CM | POA: Diagnosis not present

## 2017-05-13 DIAGNOSIS — Z7901 Long term (current) use of anticoagulants: Secondary | ICD-10-CM | POA: Insufficient documentation

## 2017-05-13 DIAGNOSIS — S01412A Laceration without foreign body of left cheek and temporomandibular area, initial encounter: Secondary | ICD-10-CM | POA: Insufficient documentation

## 2017-05-13 DIAGNOSIS — S0240FB Zygomatic fracture, left side, initial encounter for open fracture: Secondary | ICD-10-CM | POA: Diagnosis not present

## 2017-05-13 DIAGNOSIS — N189 Chronic kidney disease, unspecified: Secondary | ICD-10-CM | POA: Insufficient documentation

## 2017-05-13 DIAGNOSIS — S0990XA Unspecified injury of head, initial encounter: Secondary | ICD-10-CM

## 2017-05-13 MED ORDER — CEPHALEXIN 250 MG PO CAPS: 250.0000 mg | ORAL_CAPSULE | Freq: Four times a day (QID) | ORAL | 0 refills | Status: DC

## 2017-05-13 MED ORDER — LIDOCAINE-EPINEPHRINE (PF) 2 %-1:200000 IJ SOLN
20.0000 mL | Freq: Once | INTRAMUSCULAR | Status: AC
  Administered 2017-05-13: 20 mL
  Filled 2017-05-13: qty 20

## 2017-05-13 NOTE — ED Notes (Signed)
Report given to [facility]

## 2017-05-13 NOTE — ED Notes (Signed)
Bed: WA07 Expected date:  Expected time:  Means of arrival:  Comments: EMS- 83yo F, fall, head and ear lac

## 2017-05-13 NOTE — ED Provider Notes (Signed)
[facility] DEPT Provider Note   CSN: [id number] Arrival date & time: 05/13/17  0813     History   Chief Complaint Chief Complaint  Patient presents with  . Fall  . Head Laceration    HPI Victoria Walsh is a 83 y.o. female.  She was being transferred, from bed to wheelchair with an aide when she fell against a bedside table.  She injured the left side of her face, when she fell.  There was no loss of consciousness.  She denies neck or back pain.  She has chronic left-sided weakness secondary to a stroke.  No other recent illnesses.  There was no prodrome, prior to the fall.  She presents for evaluation, by EMS.  There are no other known modifying factors.  HPI  Past Medical History:  Diagnosis Date  . Chronic kidney disease   . CVA (cerebral infarction)   . Dementia   . Hyperlipidemia   . Osteopenia   . UTI (lower urinary tract infection)     There are no active problems to display for this patient.   Past Surgical History:  Procedure Laterality Date  . ABDOMINAL HYSTERECTOMY    . BREAST LUMPECTOMY    . REPLACEMENT TOTAL KNEE    . TOTAL HIP ARTHROPLASTY      OB History    No data available       Home Medications    Prior to Admission medications   Medication Sig Start Date End Date Taking? Authorizing Provider  acetaminophen (TYLENOL) 325 MG tablet Take 325 mg by mouth 2 (two) times daily.   Yes [provider]  brimonidine-timolol (COMBIGAN) 0.2-0.5 % ophthalmic solution Place 1 drop into the left eye every 12 (twelve) hours.   Yes [provider]  brinzolamide (AZOPT) 1 % ophthalmic suspension Place 1 drop into the left eye 2 (two) times daily.   Yes [provider]  capsicum (ZOSTRIX) 0.075 % topical cream Apply 1 application topically at bedtime. Apply to top of left foot for pain   Yes [provider]  Cranberry 450 MG CAPS Take 450 mg by mouth 2 (two) times daily.   Yes [provider]  DULoxetine (CYMBALTA)  20 MG capsule Take 20 mg by mouth at bedtime.    Yes [provider]  famotidine (PEPCID) 20 MG tablet Take 20 mg by mouth at bedtime.   Yes [provider]  fluticasone (FLONASE) 50 MCG/ACT nasal spray Place 2 sprays into both nostrils daily.   Yes [provider]  gabapentin (NEURONTIN) 100 MG capsule Take 100 mg by mouth at bedtime.    Yes [provider]  latanoprost (XALATAN) 0.005 % ophthalmic solution Place 1 drop into the left eye at bedtime.   Yes [provider]  letrozole (FEMARA) 2.5 MG tablet Take 2.5 mg by mouth daily.   Yes [provider]  levothyroxine (SYNTHROID, LEVOTHROID) 100 MCG tablet Take 100 mcg by mouth daily before breakfast.    Yes [provider]  Multiple Vitamins-Iron (MULTIVITAMINS WITH IRON) TABS tablet Take 1 tablet by mouth at bedtime.   Yes [provider]  Multiple Vitamins-Minerals (ICAPS AREDS 2) CAPS Take 1 capsule by mouth daily.   Yes [provider]  pregabalin (LYRICA) 25 MG capsule Take 25 mg by mouth at bedtime.    Yes [provider]  Psyllium 500 MG CAPS Take 500 mg by mouth at bedtime. For constipation   Yes [provider]  rivaroxaban (XARELTO) 20  MG TABS tablet Take 20 mg by mouth daily with supper.   Yes [provider]  traMADol (ULTRAM) 50 MG tablet Take 50 mg by mouth 2 (two) times daily.   Yes [provider]    Family History History reviewed. No pertinent family history.  Social History Social History  Substance Use Topics  . Smoking status: Never Smoker  . Smokeless tobacco: Never Used  . Alcohol use Yes     Comment: socially: Once every month     Allergies   Ciprofloxacin; Oxaprozin; and Sulfa antibiotics   Review of Systems Review of Systems  All other systems reviewed and are negative.    Physical Exam Updated Vital Signs BP (!) 152/64 (BP Location: Left Arm)   Pulse 70   Temp 98 F (36.7 C)  (Oral)   Resp 16   SpO2 100%   Physical Exam  Constitutional: She is oriented to person, place, and time. She appears well-developed.  Elderly, frail  HENT:  Head: Normocephalic.  Laceration left earlobe, and left cheek.  Both are gaping and bleeding.  No midface crepitation or deformity.  Normal TMJ motion.  No injury to cranium or scalp.  Eyes: Pupils are equal, round, and reactive to light. Conjunctivae and EOM are normal.  Neck: Normal range of motion and phonation normal. Neck supple.  Cardiovascular: Normal rate and regular rhythm.   Pulmonary/Chest: Effort normal and breath sounds normal. She exhibits tenderness (Left anterior chest wall without crepitation or deformity.).  Abdominal: Soft. She exhibits no distension. There is no tenderness. There is no guarding.  Musculoskeletal: Normal range of motion.  No large joint deformity.  Neurological: She is alert and oriented to person, place, and time.  No dysarthria, or aphasia.  Mild left-sided weakness arm and leg.  Skin: Skin is warm and dry.  Psychiatric: She has a normal mood and affect. Her behavior is normal. Judgment and thought content normal.  Nursing note and vitals reviewed.    ED Treatments / Results  Labs (all labs ordered are listed, but only abnormal results are displayed) Labs Reviewed - No data to display  EKG  EKG Interpretation None       Radiology No results found.  Procedures .Marland KitchenLaceration Repair Date/Time: 05/13/2017 12:20 PM Performed by: Daleen Bo Authorized by: Daleen Bo   Consent:    Consent obtained:  Verbal   Consent given by:  Patient   Risks discussed:  Pain   Alternatives discussed:  No treatment Anesthesia (see MAR for exact dosages):    Anesthesia method:  Local infiltration   Local anesthetic:  Lidocaine 2% WITH epi Laceration details:    Location:  Face   Facial location: Left cheek.   Length (cm):  2.5   Depth (mm):  1.5 Repair type:    Repair type:   Simple Pre-procedure details:    Preparation:  Patient was prepped and draped in usual sterile fashion and imaging obtained to evaluate for foreign bodies Exploration:    Hemostasis achieved with:  Direct pressure   Wound exploration: wound explored through full range of motion     Wound extent: underlying fracture     Wound extent: no vascular damage noted     Contaminated: no   Treatment:    Area cleansed with:  Betadine   Amount of cleaning:  Extensive   Irrigation solution:  Sterile water   Visualized foreign bodies/material removed: no   Skin repair:    Repair method:  Sutures   Suture  size:  5-0   Suture material:  Prolene   Suture technique:  Simple interrupted   Number of sutures:  4 Approximation:    Approximation:  Close   Vermilion border: well-aligned   Post-procedure details:    Dressing:  Antibiotic ointment   Patient tolerance of procedure:  Tolerated well, no immediate complications .Marland KitchenLaceration Repair Date/Time: 05/13/2017 12:22 PM Performed by: Daleen Bo Authorized by: Daleen Bo   Consent:    Consent obtained:  Verbal   Consent given by:  Patient   Risks discussed:  Infection and pain   Alternatives discussed:  No treatment Anesthesia (see MAR for exact dosages):    Anesthesia method:  Local infiltration   Local anesthetic:  Lidocaine 2% WITH epi Laceration details:    Location:  Ear   Ear location:  L ear (Earlobe)   Length (cm):  3.5 (Anterior to posterior earlobe) Repair type:    Repair type:  Complex Pre-procedure details:    Preparation:  Patient was prepped and draped in usual sterile fashion Exploration:    Limited defect created (wound extended): no     Hemostasis achieved with:  Direct pressure   Wound extent: no vascular damage noted     Contaminated: no   Treatment:    Area cleansed with:  Betadine and saline   Amount of cleaning:  Standard   Irrigation solution:  Sterile water   Visualized foreign bodies/material removed:  no     Debridement:  None   Scar revision: no   Skin repair:    Repair method:  Sutures   Suture size:  5-0   Suture material:  Prolene   Suture technique:  Simple interrupted Approximation:    Approximation:  Close Post-procedure details:    Dressing:  Antibiotic ointment   Patient tolerance of procedure:  Tolerated well, no immediate complications   (including critical care time)  Medications Ordered in ED Medications - No data to display   Initial Impression / Assessment and Plan / ED Course  I have reviewed the triage vital signs and the nursing notes.  Pertinent labs & imaging results that were available during my care of the patient were reviewed by me and considered in my medical decision making (see chart for details).  Clinical Course as of May 13 1156  Fri May 13, 2017  1018 DG Ribs Unilateral W/Chest Left [EW]  1018 CT Head Wo Contrast [EW]  1019 CT Head Wo Contrast [EW]    Clinical Course User Index [EW] Daleen Bo, MD     Patient Vitals for the past 24 hrs:  BP Temp Temp src Pulse Resp SpO2  05/13/17 0828 (!) 152/64 98 F (36.7 C) Oral 70 16 100 %  05/13/17 0814 - - - - - 100 %    At D/C Reevaluation with update and discussion. After initial assessment and treatment, an updated evaluation reveals patient remains comfortable.  Findings discussed with the patient and all questions answered. Gilbert Manolis L      Final Clinical Impressions(s) / ED Diagnoses   Final diagnoses:  Injury of head, initial encounter  Laceration of antihelix of left ear, initial encounter  Laceration of left cheek, initial encounter  Open fracture of left zygomatic arch, initial encounter ([place])   Apparent mechanical fall with injuries to head and face.  Facial lacerations sutured.  No intracranial or spinal lesion.  Left zygoma fracture, open, to laceration and cheek.  No foreign bodies in the left facial wound.  Will cover with  antibiotic to prevent infection.  Nursing  Notes Reviewed/ Care Coordinated Applicable Imaging Reviewed Interpretation of Laboratory Data incorporated into ED treatment  The patient appears reasonably screened and/or stabilized for discharge and I doubt any other medical condition or other [facility] requiring further screening, evaluation, or treatment in the ED at this time prior to discharge.  Plan: Home Medications-continue current medications; Home Treatments-wound care; return here if the recommended treatment, does not improve the symptoms; Recommended follow up-ENT follow-up 1 week.  Suture removal 5-7 days.  New Prescriptions Discharge Medication List as of 05/13/2017 12:05 PM    START taking these medications   Details  cephALEXin (KEFLEX) 250 MG capsule Take 1 capsule (250 mg total) by mouth 4 (four) times daily., Starting Fri 05/13/2017, Print         Daleen Bo, MD 05/16/17 1045

## 2017-05-13 NOTE — Discharge Instructions (Signed)
Your lacerations have been sutured.  You have fractured your left facial bone, zygomatic arch.  We are prescribing an antibiotic, to help prevent infection in the bone of your left face.  Your lacerations should be treated with daily cleansings with soap and water, then apply a antibiotic ointment such as Neosporin.  Follow-up with the recommended ENT physician regarding the facial fracture, next week.  Your stitches can be removed in 5-7 days.

## 2017-05-13 NOTE — ED Notes (Signed)
Xylocaine at bedside.

## 2017-05-13 NOTE — ED Notes (Signed)
Pt updated and given warm blankets x 2.  Suture cart outside room.

## 2017-05-13 NOTE — ED Triage Notes (Signed)
Per EMS, Pt, from [facility], c/o L ear lac, L facial lac, and skin tear on R forearm r/t trip and this morning.  Pain score 3/10.  Gauze dressing placed by EMS.

## 2018-04-29 ENCOUNTER — Emergency Department (HOSPITAL_COMMUNITY)
Admission: EM | Admit: 2018-04-29 | Discharge: 2018-04-29 | Disposition: A | Discharge status: Home / Self Care | Payer: Medicare Other | Attending: Emergency Medicine | Admitting: Emergency Medicine

## 2018-04-29 ENCOUNTER — Encounter (HOSPITAL_COMMUNITY): Payer: Self-pay | Admitting: Emergency Medicine

## 2018-04-29 ENCOUNTER — Emergency Department (HOSPITAL_COMMUNITY): Payer: Medicare Other

## 2018-04-29 DIAGNOSIS — Y999 Unspecified external cause status: Secondary | ICD-10-CM | POA: Diagnosis not present

## 2018-04-29 DIAGNOSIS — S0101XA Laceration without foreign body of scalp, initial encounter: Secondary | ICD-10-CM | POA: Diagnosis not present

## 2018-04-29 DIAGNOSIS — W19XXXA Unspecified fall, initial encounter: Secondary | ICD-10-CM

## 2018-04-29 DIAGNOSIS — Y93E1 Activity, personal bathing and showering: Secondary | ICD-10-CM | POA: Insufficient documentation

## 2018-04-29 DIAGNOSIS — F0390 Unspecified dementia without behavioral disturbance: Secondary | ICD-10-CM | POA: Diagnosis not present

## 2018-04-29 DIAGNOSIS — Z96649 Presence of unspecified artificial hip joint: Secondary | ICD-10-CM | POA: Insufficient documentation

## 2018-04-29 DIAGNOSIS — Z79899 Other long term (current) drug therapy: Secondary | ICD-10-CM | POA: Insufficient documentation

## 2018-04-29 DIAGNOSIS — W01198A Fall on same level from slipping, tripping and stumbling with subsequent striking against other object, initial encounter: Secondary | ICD-10-CM | POA: Insufficient documentation

## 2018-04-29 DIAGNOSIS — S0990XA Unspecified injury of head, initial encounter: Secondary | ICD-10-CM | POA: Diagnosis present

## 2018-04-29 DIAGNOSIS — Y92121 Bathroom in nursing home as the place of occurrence of the external cause: Secondary | ICD-10-CM | POA: Insufficient documentation

## 2018-04-29 DIAGNOSIS — Z7901 Long term (current) use of anticoagulants: Secondary | ICD-10-CM | POA: Insufficient documentation

## 2018-04-29 DIAGNOSIS — Z96659 Presence of unspecified artificial knee joint: Secondary | ICD-10-CM | POA: Insufficient documentation

## 2018-04-29 DIAGNOSIS — N189 Chronic kidney disease, unspecified: Secondary | ICD-10-CM | POA: Diagnosis not present

## 2018-04-29 NOTE — ED Provider Notes (Signed)
[facility] EMERGENCY DEPARTMENT Provider Note   CSN: [id number] Arrival date & time: 04/29/18  2979     History   Chief Complaint Chief Complaint  Patient presents with  . Fall    HPI Victoria Walsh is a 84 y.o. female who presents emergency department sent in by her nursing facility for mechanical fall this morning.  Patient had a witnessed fall after trying to transfer to a shower chair where she slipped and hit the back of her head on the corner of the tub.  She is small laceration.  She is on Xarelto.  The patient is at her baseline mental status.  There is a level 5 caveat due to dementia.  HPI  Past Medical History:  Diagnosis Date  . Chronic kidney disease   . CVA (cerebral infarction)   . Dementia   . Hyperlipidemia   . Osteopenia   . UTI (lower urinary tract infection)     There are no active problems to display for this patient.   Past Surgical History:  Procedure Laterality Date  . ABDOMINAL HYSTERECTOMY    . BREAST LUMPECTOMY    . REPLACEMENT TOTAL KNEE    . TOTAL HIP ARTHROPLASTY       OB History   None      Home Medications    Prior to Admission medications   Medication Sig Start Date End Date Taking? Authorizing Provider  acetaminophen (TYLENOL) 325 MG tablet Take 325 mg by mouth 2 (two) times daily.    [provider]  brimonidine-timolol (COMBIGAN) 0.2-0.5 % ophthalmic solution Place 1 drop into the left eye every 12 (twelve) hours.    [provider]  brinzolamide (AZOPT) 1 % ophthalmic suspension Place 1 drop into the left eye 2 (two) times daily.    [provider]  capsicum (ZOSTRIX) 0.075 % topical cream Apply 1 application topically at bedtime. Apply to top of left foot for pain    [provider]  cephALEXin (KEFLEX) 250 MG capsule Take 1 capsule (250 mg total) by mouth 4 (four) times daily. 05/13/17   Daleen Bo, MD  Cranberry 450 MG CAPS Take 450 mg by mouth 2 (two) times daily.     [provider]  DULoxetine (CYMBALTA) 20 MG capsule Take 20 mg by mouth at bedtime.     [provider]  famotidine (PEPCID) 20 MG tablet Take 20 mg by mouth at bedtime.    [provider]  fluticasone (FLONASE) 50 MCG/ACT nasal spray Place 2 sprays into both nostrils daily.    [provider]  gabapentin (NEURONTIN) 100 MG capsule Take 100 mg by mouth at bedtime.     [provider]  latanoprost (XALATAN) 0.005 % ophthalmic solution Place 1 drop into the left eye at bedtime.    [provider]  letrozole (FEMARA) 2.5 MG tablet Take 2.5 mg by mouth daily.    [provider]  levothyroxine (SYNTHROID, LEVOTHROID) 100 MCG tablet Take 100 mcg by mouth daily before breakfast.     [provider]  Multiple Vitamins-Iron (MULTIVITAMINS WITH IRON) TABS tablet Take 1 tablet by mouth at bedtime.    [provider]  Multiple Vitamins-Minerals (ICAPS AREDS 2) CAPS Take 1 capsule by mouth daily.    [provider]  pregabalin (LYRICA) 25 MG capsule Take 25 mg by mouth at bedtime.     [provider]  Psyllium 500 MG CAPS Take 500 mg by mouth at bedtime. For  constipation    [provider]  rivaroxaban (XARELTO) 20 MG TABS tablet Take 20 mg by mouth daily with supper.    [provider]  traMADol (ULTRAM) 50 MG tablet Take 50 mg by mouth 2 (two) times daily.    [provider]    Family History No family history on file.  Social History Social History   Tobacco Use  . Smoking status: Never Smoker  . Smokeless tobacco: Never Used  Substance Use Topics  . Alcohol use: Yes    Comment: socially: Once every month  . Drug use: No     Allergies   Ciprofloxacin; Oxaprozin; and Sulfa antibiotics   Review of Systems Review of Systems Unable to review systems secondary to dementia  Physical Exam Updated Vital Signs BP (!) 156/47 (BP Location: Right Arm)   Pulse 72   Temp  97.6 F (36.4 C) (Oral)   Resp 18   SpO2 98%   Physical Exam  Constitutional: She appears well-developed and well-nourished. No distress.  HENT:  Head: Normocephalic.  1 cm laceration on the left occipital scalp.  Superficial.  Bleeding controlled.  Eyes: Pupils are equal, round, and reactive to light. Conjunctivae and EOM are normal. No scleral icterus.  Neck:  Patient on C-spine precautions with soft towel.  No midline tenderness of the cervical spine.  Cardiovascular: Normal rate, regular rhythm and normal heart sounds. Exam reveals no gallop and no friction rub.  No murmur heard. Pulmonary/Chest: Effort normal and breath sounds normal. No respiratory distress.  Abdominal: Soft. Bowel sounds are normal. She exhibits no distension and no mass. There is no tenderness. There is no guarding.  Neurological: She is alert.  Skin: Skin is warm and dry. She is not diaphoretic.  Psychiatric: Her behavior is normal.  Nursing note and vitals reviewed.    ED Treatments / Results  Labs (all labs ordered are listed, but only abnormal results are displayed) Labs Reviewed - No data to display  EKG None  Radiology No results found.  Procedures Procedures (including critical care time) LACERATION REPAIR Performed by: Margarita Mail Authorized by: Margarita Mail Consent: Verbal consent obtained. Risks and benefits: risks, benefits and alternatives were discussed Consent given by: patient Patient identity confirmed: provided demographic data Prepped and Draped in normal sterile fashion Wound explored  Laceration Location: left occipital  Laceration Length: 1cm  No Foreign Bodies seen or palpated  Anesthesia: local infiltration  Irrigation method: syringe Amount of cleaning: standard  Skin closure: dermabond  Number of sutures: 1  Technique: Hair apposition tie  Patient tolerance: Patient tolerated the procedure well with no immediate complications.  Medications Ordered  in ED Medications - No data to display   Initial Impression / Assessment and Plan / ED Course  I have reviewed the triage vital signs and the nursing notes.  Pertinent labs & imaging results that were available during my care of the patient were reviewed by me and considered in my medical decision making (see chart for details).    VS reviewed shows mild HTnN Elderly patient with dementia pm blood thinners. Fall at nursing home. At baseline mental status.  I have reviewed the CT head/Cspine which shows age related degenerative changes of the brain and bony tissue and does not appear to show hemmorhage or fracture. I agree with the radiologists interpretation.  Appears safe to return to SNF   Final Clinical Impressions(s) / ED Diagnoses   Final diagnoses:  None    ED Discharge Orders  None       Margarita Mail, PA-C 04/29/18 1126    Malvin Johns, MD 04/29/18 1318

## 2018-04-29 NOTE — ED Triage Notes (Addendum)
Per [facility]: Patient to ED from home following mechanical fall - patient was standing from the toilet to get into her shower chair, lost her balance and fell, hitting her head on the corner of the shower. Small lac to L side of head, bleeding controlled. Patient denies LOC. Towel rolls applied PTA. Patient denies pain, vision changes, dizziness. No other obvious signs of injury. Hx CVA with L-sided deficits. EMS VS: P 60, 152/70, RR 18, 100% RA, CBG 107. A&O x 4. On Xarelto.

## 2018-04-29 NOTE — ED Notes (Signed)
Patient given discharge instructions and verbalized understanding.  Patient stable to discharge at this time.  Patient is alert and oriented to baseline.  No distressed noted at this time.  All belongings taken with the patient at discharge.   

## 2018-04-29 NOTE — ED Notes (Signed)
Called [facility] for pt transport  

## 2018-04-29 NOTE — Discharge Instructions (Addendum)
Get help right away if: Your wound reopens and is draining. Your wound becomes red, swollen, hot, or tender. You develop a rash after the glue is applied. You have increasing pain in the wound. You have a red streak going away from the wound. You have pus coming from the wound. You have increased bleeding. You have a fever. You have shaking chills. You notice a bad smell coming from the wound. Your wound or the adhesive breaks open. 

## 2018-05-02 ENCOUNTER — Emergency Department (HOSPITAL_COMMUNITY)
Admission: EM | Admit: 2018-05-02 | Discharge: 2018-05-03 | Disposition: A | Discharge status: Home / Self Care | Payer: Medicare Other | Attending: Emergency Medicine | Admitting: Emergency Medicine

## 2018-05-02 DIAGNOSIS — N189 Chronic kidney disease, unspecified: Secondary | ICD-10-CM | POA: Insufficient documentation

## 2018-05-02 DIAGNOSIS — F0390 Unspecified dementia without behavioral disturbance: Secondary | ICD-10-CM | POA: Diagnosis not present

## 2018-05-02 DIAGNOSIS — R310 Gross hematuria: Secondary | ICD-10-CM | POA: Diagnosis present

## 2018-05-02 DIAGNOSIS — Z96659 Presence of unspecified artificial knee joint: Secondary | ICD-10-CM | POA: Insufficient documentation

## 2018-05-02 DIAGNOSIS — Z8673 Personal history of transient ischemic attack (TIA), and cerebral infarction without residual deficits: Secondary | ICD-10-CM | POA: Insufficient documentation

## 2018-05-02 DIAGNOSIS — Z7901 Long term (current) use of anticoagulants: Secondary | ICD-10-CM | POA: Diagnosis not present

## 2018-05-02 DIAGNOSIS — N3001 Acute cystitis with hematuria: Secondary | ICD-10-CM | POA: Diagnosis not present

## 2018-05-02 DIAGNOSIS — Z79899 Other long term (current) drug therapy: Secondary | ICD-10-CM | POA: Diagnosis not present

## 2018-05-02 LAB — URINALYSIS, ROUTINE W REFLEX MICROSCOPIC

## 2018-05-02 LAB — URINALYSIS, MICROSCOPIC (REFLEX)

## 2018-05-02 MED ORDER — CEPHALEXIN 500 MG PO CAPS
500.0000 mg | ORAL_CAPSULE | Freq: Once | ORAL | Status: AC
  Administered 2018-05-03: 500 mg via ORAL
  Filled 2018-05-02: qty 1

## 2018-05-02 MED ORDER — CEPHALEXIN 500 MG PO CAPS: 500.0000 mg | ORAL_CAPSULE | Freq: Four times a day (QID) | ORAL | 0 refills | Status: DC

## 2018-05-02 NOTE — ED Provider Notes (Signed)
[facility] DEPT Provider Note   CSN: [id number] Arrival date & time: 05/02/18  2134     History   Chief Complaint Chief Complaint  Patient presents with  . Blood in urine/foley    HPI Victoria Walsh is a 84 y.o. female.  Pt presents to the ED today with blood in her foley catheter bag.  The pt's SNF noticed the blood today.  Pt denies pain.  The pt has a hx of frequent UTIs and she is on Xarelto for CVA.      Past Medical History:  Diagnosis Date  . Chronic kidney disease   . CVA (cerebral infarction)   . Dementia   . Hyperlipidemia   . Osteopenia   . UTI (lower urinary tract infection)     There are no active problems to display for this patient.   Past Surgical History:  Procedure Laterality Date  . ABDOMINAL HYSTERECTOMY    . BREAST LUMPECTOMY    . REPLACEMENT TOTAL KNEE    . TOTAL HIP ARTHROPLASTY       OB History   None      Home Medications    Prior to Admission medications   Medication Sig Start Date End Date Taking? Authorizing Provider  acetaminophen (TYLENOL) 325 MG tablet Take 325 mg by mouth 2 (two) times daily.   Yes [provider]  brimonidine-timolol (COMBIGAN) 0.2-0.5 % ophthalmic solution Place 1 drop into the left eye every 12 (twelve) hours.   Yes [provider]  brinzolamide (AZOPT) 1 % ophthalmic suspension Place 1 drop into the left eye 2 (two) times daily.   Yes [provider]  capsicum (ZOSTRIX) 0.075 % topical cream Apply 1 application topically at bedtime.    Yes [provider]  Cranberry 450 MG CAPS Take 450 mg by mouth 2 (two) times daily.   Yes [provider]  DULoxetine (CYMBALTA) 20 MG capsule Take 20 mg by mouth at bedtime.    Yes [provider]  famotidine (PEPCID) 20 MG tablet Take 20 mg by mouth at bedtime.   Yes [provider]  latanoprost (XALATAN) 0.005 % ophthalmic solution Place 1 drop into the left eye at bedtime.    Yes [provider]  letrozole (FEMARA) 2.5 MG tablet Take 2.5 mg by mouth daily.   Yes [provider]  levothyroxine (SYNTHROID, LEVOTHROID) 100 MCG tablet Take 100 mcg by mouth daily before breakfast.    Yes [provider]  Multiple Vitamins-Iron (MULTIVITAMINS WITH IRON) TABS tablet Take 1 tablet by mouth at bedtime.   Yes [provider]  Multiple Vitamins-Minerals (ICAPS AREDS 2) CAPS Take 1 capsule by mouth daily.   Yes [provider]  pregabalin (LYRICA) 25 MG capsule Take 25 mg by mouth at bedtime.    Yes [provider]  Psyllium 500 MG CAPS Take 500 mg by mouth at bedtime.    Yes [provider]  rivaroxaban (XARELTO) 20 MG TABS tablet Take 20 mg by mouth daily with supper.   Yes [provider]  traMADol (ULTRAM) 50 MG tablet Take 25 mg by mouth 2 (two) times daily.    Yes [provider]  cephALEXin (KEFLEX) 500 MG capsule Take 1 capsule (500 mg total) by mouth 4 (four) times daily. 05/02/18   Isla Pence, MD    Family History No family history on file.  Social History Social History   Tobacco Use  . Smoking status: Never Smoker  .  Smokeless tobacco: Never Used  Substance Use Topics  . Alcohol use: Yes    Comment: socially: Once every month  . Drug use: No     Allergies   Ciprofloxacin; Oxaprozin; and Sulfa antibiotics   Review of Systems Review of Systems  Genitourinary:       Blood in urine  All other systems reviewed and are negative.    Physical Exam Updated Vital Signs BP (!) 156/56   Pulse 76   Temp 97.6 F (36.4 C)   Resp 17   SpO2 99%   Physical Exam  Constitutional: She appears well-developed and well-nourished.  HENT:  Head: Normocephalic and atraumatic.  Right Ear: External ear normal.  Left Ear: External ear normal.  Nose: Nose normal.  Mouth/Throat: Oropharynx is clear and moist.  Eyes: Pupils are equal, round, and reactive to light. Conjunctivae and  EOM are normal.  Neck: Normal range of motion. Neck supple.  Cardiovascular: Normal rate, regular rhythm, normal heart sounds and intact distal pulses.  Pulmonary/Chest: Effort normal and breath sounds normal.  Abdominal: Soft. Bowel sounds are normal.  Musculoskeletal: Normal range of motion.  Neurological: She is alert.  Left sided weakness from prior cva  Skin: Skin is warm and dry. Capillary refill takes less than 2 seconds.  Psychiatric: She has a normal mood and affect. Her behavior is normal. Judgment and thought content normal.  Nursing note and vitals reviewed.    ED Treatments / Results  Labs (all labs ordered are listed, but only abnormal results are displayed) Labs Reviewed  URINALYSIS, ROUTINE W REFLEX MICROSCOPIC - Abnormal; Notable for the following components:      Result Value   Color, Urine RED (*)    APPearance TURBID (*)    Glucose, UA   (*)    Value: TEST NOT REPORTED DUE TO COLOR INTERFERENCE OF URINE PIGMENT   Hgb urine dipstick   (*)    Value: TEST NOT REPORTED DUE TO COLOR INTERFERENCE OF URINE PIGMENT   Bilirubin Urine   (*)    Value: TEST NOT REPORTED DUE TO COLOR INTERFERENCE OF URINE PIGMENT   Ketones, ur   (*)    Value: TEST NOT REPORTED DUE TO COLOR INTERFERENCE OF URINE PIGMENT   Protein, ur   (*)    Value: TEST NOT REPORTED DUE TO COLOR INTERFERENCE OF URINE PIGMENT   Nitrite   (*)    Value: TEST NOT REPORTED DUE TO COLOR INTERFERENCE OF URINE PIGMENT   Leukocytes, UA   (*)    Value: TEST NOT REPORTED DUE TO COLOR INTERFERENCE OF URINE PIGMENT   All other components within normal limits  URINALYSIS, MICROSCOPIC (REFLEX) - Abnormal; Notable for the following components:   Bacteria, UA FEW (*)    All other components within normal limits  URINE CULTURE    EKG None  Radiology No results found.  Procedures Procedures (including critical care time)  Medications Ordered in ED Medications  cephALEXin (KEFLEX) capsule 500 mg (has no  administration in time range)     Initial Impression / Assessment and Plan / ED Course  I have reviewed the triage vital signs and the nursing notes.  Pertinent labs & imaging results that were available during my care of the patient were reviewed by me and considered in my medical decision making (see chart for details).   Nurse unable to irrigate foley, so she replaced it.  The urine is foul smelling, and due to pigment, normal ua tests unable to  be run.  Urine sent for culture.  She will be started on keflex.  Final Clinical Impressions(s) / ED Diagnoses   Final diagnoses:  Gross hematuria  Acute cystitis with hematuria    ED Discharge Orders         Ordered    cephALEXin (KEFLEX) 500 MG capsule  4 times daily     05/02/18 2321           Isla Pence, MD 05/02/18 2325

## 2018-05-02 NOTE — ED Triage Notes (Signed)
Patient arrives by [facility] from [facility] out for bloody urine and blood clots in urine-replaced 6 days ago-no abdominal pain-patient has no complaints of pain at this time. Patient is on Xarelto. Hx CVA with left sided deficits.

## 2018-05-02 NOTE — ED Notes (Signed)
Bed: WA20 Expected date:  Expected time:  Means of arrival:  Comments: EMS bloody urine and clots in foley

## 2018-05-02 NOTE — Discharge Instructions (Addendum)
Hold Xarelto today and tomorrow.

## 2018-05-02 NOTE — ED Notes (Signed)
Unable to irrigate catheter.

## 2018-05-02 NOTE — ED Notes (Signed)
Unable to irrigate foley catheter-foley removed-30 cc balloon deflated-tip of catheter black and foul smelling. Reinserted #14 Fr foley with return of cloudy blood tinged urine with sediment

## 2018-05-07 LAB — URINE CULTURE

## 2018-05-08 ENCOUNTER — Telehealth: Payer: Self-pay | Admitting: Emergency Medicine

## 2018-05-08 ENCOUNTER — Telehealth (HOSPITAL_COMMUNITY): Payer: Self-pay | Admitting: Pharmacist

## 2018-05-08 NOTE — Progress Notes (Signed)
ED Antimicrobial Stewardship Positive Culture Follow Up   Victoria Walsh is an 84 y.o. female who presented to [facility] on (Not on file) with a chief complaint of No chief complaint on file.   Recent Results (from the past 720 hour(s))  Urine culture     Status: Abnormal   Collection Time: 05/02/18 10:26 PM  Result Value Ref Range Status   Specimen Description   Final    URINE, RANDOM Performed at [facility] [address] [place], [place] [address]    Special Requests   Final    NONE Performed at [facility], [place] [address], [place], [place] [address]    Culture (A)  Final    >=100,000 COLONIES/mL ESCHERICHIA COLI >=100,000 COLONIES/mL PROTEUS MIRABILIS    Report Status 05/07/2018 FINAL  Final   Organism ID, Bacteria PROTEUS MIRABILIS (A)  Final   Organism ID, Bacteria ESCHERICHIA COLI (A)  Final      Susceptibility   Escherichia coli - MIC*    AMPICILLIN >=32 RESISTANT Resistant     CEFAZOLIN 16 SENSITIVE Sensitive     CEFTRIAXONE <=1 SENSITIVE Sensitive     CIPROFLOXACIN <=0.25 SENSITIVE Sensitive     GENTAMICIN <=1 SENSITIVE Sensitive     IMIPENEM <=0.25 SENSITIVE Sensitive     NITROFURANTOIN <=16 SENSITIVE Sensitive     TRIMETH/SULFA <=20 SENSITIVE Sensitive     AMPICILLIN/SULBACTAM >=32 RESISTANT Resistant     PIP/TAZO >=128 RESISTANT Resistant     Extended ESBL NEGATIVE Sensitive     * >=100,000 COLONIES/mL ESCHERICHIA COLI   Proteus mirabilis - MIC*    AMPICILLIN RESISTANT Resistant     CEFAZOLIN RESISTANT Resistant     CEFTRIAXONE RESISTANT Resistant     CIPROFLOXACIN <=0.25 SENSITIVE Sensitive     GENTAMICIN <=1 SENSITIVE Sensitive     IMIPENEM 2 SENSITIVE Sensitive     NITROFURANTOIN 128 RESISTANT Resistant     TRIMETH/SULFA <=20 SENSITIVE Sensitive     AMPICILLIN/SULBACTAM <=2 SENSITIVE Sensitive     PIP/TAZO <=4 SENSITIVE Sensitive     * >=100,000 COLONIES/mL PROTEUS MIRABILIS    [x]  Treated with Keflex,  organism resistant to prescribed antimicrobial []  Patient discharged originally without antimicrobial agent and treatment is now indicated  New antibiotic prescription: Fosfomycin 3 g PO x1  ED Provider: Mal Misty, PA-C   MastersJake Church 05/08/2018, 2:51 PM Clinical Pharmacist Monday - Friday phone -  [phone] Saturday - Sunday phone - [phone]

## 2018-05-08 NOTE — Telephone Encounter (Signed)
Post ED Visit - Positive Culture Follow-up  Culture report reviewed by antimicrobial stewardship pharmacist:  []  Elenor Quinones, Pharm.D. []  Heide Guile, Pharm.D., BCPS AQ-ID []  Parks Neptune, Pharm.D., BCPS []  Alycia Rossetti, Pharm.D., BCPS []  [place], [place].D., BCPS, AAHIVP []  Legrand Como, Pharm.D., BCPS, AAHIVP []  Salome Arnt, PharmD, BCPS []  Johnnette Gourd, PharmD, BCPS [x]  Hughes Better, PharmD, BCPS []  Leeroy Cha, PharmD  Positive urine culture Treated with cephalexin, organism sensitive to the same and no further patient follow-up is required at this time.  Hazle Nordmann 05/08/2018, 3:40 PM

## 2018-05-08 NOTE — Telephone Encounter (Signed)
Post ED Visit - Positive Culture Follow-up: Successful Patient Follow-Up  Culture assessed and recommendations reviewed by:  []  Elenor Quinones, Pharm.D. []  Heide Guile, Pharm.D., BCPS AQ-ID []  Parks Neptune, Pharm.D., BCPS []  Alycia Rossetti, Pharm.D., BCPS []  [place], Pharm.D., BCPS, AAHIVP []  Legrand Como, Pharm.D., BCPS, AAHIVP []  Salome Arnt, PharmD, BCPS []  Johnnette Gourd, PharmD, BCPS [x]  Hughes Better, PharmD, BCPS []  Leeroy Cha, PharmD  Positive urine culture  []  Patient discharged without antimicrobial prescription and treatment is now indicated [x]  Organism is resistant to prescribed ED discharge antimicrobial []  Patient with positive blood cultures  Changes discussed with ED provider: Delia Heady PA New antibiotic prescription stop keflex, start fosfomycin 3 Grams po x 1  Faxed ti [phone] [facility] [address]   Hazle Nordmann 05/08/2018, 4:18 PM

## 2018-05-08 NOTE — Telephone Encounter (Signed)
Correction from previous note re visit on 05/02/2018

## 2018-07-17 ENCOUNTER — Emergency Department (HOSPITAL_COMMUNITY): Payer: Medicare Other

## 2018-07-17 ENCOUNTER — Emergency Department (HOSPITAL_COMMUNITY)
Admission: EM | Admit: 2018-07-17 | Discharge: 2018-07-17 | Payer: Medicare Other | Attending: Emergency Medicine | Admitting: Emergency Medicine

## 2018-07-17 ENCOUNTER — Other Ambulatory Visit: Payer: Self-pay

## 2018-07-17 DIAGNOSIS — Y999 Unspecified external cause status: Secondary | ICD-10-CM | POA: Insufficient documentation

## 2018-07-17 DIAGNOSIS — N189 Chronic kidney disease, unspecified: Secondary | ICD-10-CM | POA: Insufficient documentation

## 2018-07-17 DIAGNOSIS — Z96642 Presence of left artificial hip joint: Secondary | ICD-10-CM | POA: Insufficient documentation

## 2018-07-17 DIAGNOSIS — Y69 Unspecified misadventure during surgical and medical care: Secondary | ICD-10-CM | POA: Diagnosis not present

## 2018-07-17 DIAGNOSIS — Z79899 Other long term (current) drug therapy: Secondary | ICD-10-CM | POA: Diagnosis not present

## 2018-07-17 DIAGNOSIS — W06XXXA Fall from bed, initial encounter: Secondary | ICD-10-CM | POA: Diagnosis not present

## 2018-07-17 DIAGNOSIS — Y939 Activity, unspecified: Secondary | ICD-10-CM | POA: Diagnosis not present

## 2018-07-17 DIAGNOSIS — Y92129 Unspecified place in nursing home as the place of occurrence of the external cause: Secondary | ICD-10-CM | POA: Insufficient documentation

## 2018-07-17 DIAGNOSIS — M79605 Pain in left leg: Secondary | ICD-10-CM | POA: Diagnosis present

## 2018-07-17 DIAGNOSIS — T83511A Infection and inflammatory reaction due to indwelling urethral catheter, initial encounter: Secondary | ICD-10-CM | POA: Insufficient documentation

## 2018-07-17 DIAGNOSIS — F0390 Unspecified dementia without behavioral disturbance: Secondary | ICD-10-CM | POA: Insufficient documentation

## 2018-07-17 DIAGNOSIS — N390 Urinary tract infection, site not specified: Secondary | ICD-10-CM

## 2018-07-17 DIAGNOSIS — W19XXXA Unspecified fall, initial encounter: Secondary | ICD-10-CM

## 2018-07-17 LAB — URINALYSIS, ROUTINE W REFLEX MICROSCOPIC
Bilirubin Urine: NEGATIVE
Glucose, UA: NEGATIVE mg/dL
Ketones, ur: 20 mg/dL — AB
Nitrite: NEGATIVE
Protein, ur: 100 mg/dL — AB
Specific Gravity, Urine: 1.02 (ref 1.005–1.030)
WBC, UA: >50 WBC/hpf — ABNORMAL HIGH (ref 0–5)
pH: 7 (ref 5.0–8.0)

## 2018-07-17 LAB — CBC
HEMATOCRIT: 40 % (ref 36.0–46.0)
Hemoglobin: 12 g/dL (ref 12.0–15.0)
MCH: 30.5 pg (ref 26.0–34.0)
MCHC: 30 g/dL (ref 30.0–36.0)
MCV: 101.5 fL — ABNORMAL HIGH (ref 80.0–100.0)
Platelets: 172 10*3/uL (ref 150–400)
RBC: 3.94 MIL/uL (ref 3.87–5.11)
RDW: 13.1 % (ref 11.5–15.5)
WBC: 11.8 10*3/uL — ABNORMAL HIGH (ref 4.0–10.5)
nRBC: 0 % (ref 0.0–0.2)

## 2018-07-17 LAB — BASIC METABOLIC PANEL
Anion gap: 9 (ref 5–15)
BUN: 31 mg/dL — AB (ref 8–23)
CHLORIDE: 104 mmol/L (ref 98–111)
CO2: 25 mmol/L (ref 22–32)
Calcium: 8.6 mg/dL — ABNORMAL LOW (ref 8.9–10.3)
Creatinine, Ser: 1.6 mg/dL — ABNORMAL HIGH (ref 0.44–1.00)
GFR calc Af Amer: 34 mL/min — ABNORMAL LOW (ref >60)
GFR calc non Af Amer: 29 mL/min — ABNORMAL LOW (ref >60)
Glucose, Bld: 122 mg/dL — ABNORMAL HIGH (ref 70–99)
Potassium: 4.2 mmol/L (ref 3.5–5.1)
Sodium: 138 mmol/L (ref 135–145)

## 2018-07-17 LAB — CK: Total CK: 175 U/L (ref 38–234)

## 2018-07-17 MED ORDER — SODIUM CHLORIDE 0.9 % IV SOLN: INTRAVENOUS | Status: DC

## 2018-07-17 MED ORDER — AMOXICILLIN-POT CLAVULANATE 875-125 MG PO TABS: 1.0000 | ORAL_TABLET | Freq: Two times a day (BID) | ORAL | 0 refills | Status: AC

## 2018-07-17 MED ORDER — SODIUM CHLORIDE 0.9 % IV BOLUS
500.0000 mL | Freq: Once | INTRAVENOUS | Status: AC
  Administered 2018-07-17: 500 mL via INTRAVENOUS

## 2018-07-17 MED ORDER — AMOXICILLIN-POT CLAVULANATE 875-125 MG PO TABS
1.0000 | ORAL_TABLET | Freq: Once | ORAL | Status: AC
  Administered 2018-07-17: 1 via ORAL
  Filled 2018-07-17: qty 1

## 2018-07-17 MED ORDER — AMOXICILLIN-POT CLAVULANATE 875-125 MG PO TABS: 1.0000 | ORAL_TABLET | Freq: Once | ORAL | Status: DC

## 2018-07-17 NOTE — ED Notes (Signed)
[facility] called  

## 2018-07-17 NOTE — ED Provider Notes (Signed)
[facility] EMERGENCY DEPARTMENT Provider Note   CSN: [id number] Arrival date & time: 07/17/18  0844     History   Chief Complaint Chief Complaint  Patient presents with  . Fall  . Leg Injury    HPI Victoria Walsh is a 84 y.o. 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 She was found face down with left hip and neck pain. It is unclear how long she was down. She states she did not hit her head or have head pain but hit her nose. Epistaxis at time of fall, resolved now. Left hip tenderness. Left hip internal rotation per EMS may be secondary to previous stroke as she cannot move her left arm or leg.  There is a level 5 caveat due to dementia.    Fall  This is a new problem. Pertinent negatives include no abdominal pain.    Past Medical History:  Diagnosis Date  . Chronic kidney disease   . CVA (cerebral infarction)   . Dementia   . Hyperlipidemia   . Osteopenia   . UTI (lower urinary tract infection)     There are no active problems to display for this patient.   Past Surgical History:  Procedure Laterality Date  . ABDOMINAL HYSTERECTOMY    . BREAST LUMPECTOMY    . REPLACEMENT TOTAL KNEE    . TOTAL HIP ARTHROPLASTY       OB History   None      Home Medications    Prior to Admission medications   Medication Sig Start Date End Date Taking? Authorizing Provider  acetaminophen (TYLENOL) 325 MG tablet Take 325 mg by mouth 2 (two) times daily.   Yes [provider]  brimonidine-timolol (COMBIGAN) 0.2-0.5 % ophthalmic solution Place 1 drop into the left eye every 12 (twelve) hours.   Yes [provider]  brinzolamide (AZOPT) 1 % ophthalmic suspension Place 1 drop into the left eye 2 (two) times daily.   Yes [provider]  CAPSAICIN ARTHRITIS RELIEF EX Apply 1 application topically 2 (two) times  daily. Apply to top of left foot and leg topically twice daily for pain.   Yes [provider]  Cranberry 450 MG CAPS Take 450 mg by mouth 2 (two) times daily.   Yes [provider]  docusate sodium (COLACE) 100 MG capsule Take 100 mg by mouth daily.   Yes [provider]  DULoxetine (CYMBALTA) 20 MG capsule Take 20 mg by mouth at bedtime.    Yes [provider]  famotidine (PEPCID) 20 MG tablet Take 20 mg by mouth at bedtime.   Yes [provider]  latanoprost (XALATAN) 0.005 % ophthalmic solution Place 1 drop into the left eye at bedtime.   Yes [provider]  letrozole (FEMARA) 2.5 MG tablet Take 2.5 mg by mouth daily.   Yes [provider]  levothyroxine (SYNTHROID, LEVOTHROID) 100 MCG tablet Take 100 mcg by mouth daily before breakfast.    Yes [provider]  Multiple Vitamins-Iron (MULTIVITAMINS WITH IRON) TABS tablet Take 1 tablet by mouth at bedtime.   Yes [provider]  Multiple Vitamins-Minerals (ICAPS AREDS 2) CAPS Take 1 capsule by mouth daily.   Yes [provider]  nitrofurantoin, macrocrystal-monohydrate, (MACROBID) 100 MG capsule Take 100 mg by mouth 2 (two) times daily. 07/14/18 07/20/18 Yes [provider]  pregabalin (LYRICA)  25 MG capsule Take 25 mg by mouth at bedtime.   Yes [provider]  Psyllium 500 MG CAPS Take 500 mg by mouth at bedtime.    Yes [provider]  rivaroxaban (XARELTO) 20 MG TABS tablet Take 20 mg by mouth daily with supper.   Yes [provider]  traMADol (ULTRAM) 50 MG tablet Take 25 mg by mouth 2 (two) times daily.    Yes [provider]  amoxicillin-clavulanate (AUGMENTIN) 875-125 MG tablet Take 1 tablet by mouth 2 (two) times daily for 10 days. 07/17/18 07/27/18  Kitzia Camus A, DO  cephALEXin (KEFLEX) 500 MG capsule Take 1 capsule (500 mg total) by mouth 4 (four) times daily. Patient not taking: Reported on  07/17/2018 05/02/18   Isla Pence, MD    Family History No family history on file.  Social History Social History   Tobacco Use  . Smoking status: Never Smoker  . Smokeless tobacco: Never Used  Substance Use Topics  . Alcohol use: Yes    Comment: socially: Once every month  . Drug use: No     Allergies   Ciprofloxacin; Oxaprozin; and Sulfa antibiotics   Review of Systems Review of Systems  Gastrointestinal: Negative for abdominal pain.  Genitourinary: Positive for pelvic pain.  Musculoskeletal: Positive for neck pain. Negative for back pain and neck stiffness.  Skin: Positive for color change.  Neurological: Positive for weakness. Negative for speech difficulty and numbness.  Psychiatric/Behavioral: Positive for confusion. Negative for agitation and self-injury. The patient is not nervous/anxious.   Ten systems reviewed and are negative for acute change, except as noted in the HPI.     Physical Exam Updated Vital Signs BP 130/81   Pulse 72   Temp 98.7 F (37.1 C) (Oral)   Resp 18   Ht 5\' 6"  (1.676 m)   Wt 81 kg   SpO2 99%   BMI 28.82 kg/m   Physical Exam  Constitutional: She appears well-developed. No distress. Cervical collar in place.  HENT:  Head: Normocephalic and atraumatic.  Eyes: Right pupil is reactive (difficulty opening eyes). Left pupil is not reactive.  Neck: Neck supple. No tracheal tenderness present. Decreased range of motion: pain wiht movement.  Cardiovascular: Normal rate. An irregular rhythm present.  No murmur heard. Pulses:      Dorsalis pedis pulses are 2+ on the right side, and 2+ on the left side.  Pulmonary/Chest: Effort normal and breath sounds normal. No respiratory distress. She exhibits no tenderness.  Abdominal: Soft. Bowel sounds are normal. There is no tenderness.  Musculoskeletal: She exhibits tenderness. She exhibits no edema.  Neurological: She is alert. No sensory deficit.  Oriented to self Strength 2/5 LLE, 0/5 LUE,  5/5 RUE, 3/5 RLE  Skin: Skin is warm, dry and intact. No abrasion, no bruising, no laceration and no lesion noted. There is erythema (LLE).  Psychiatric: She has a normal mood and affect.  Nursing note and vitals reviewed.    ED Treatments / Results  Labs (all labs ordered are listed, but only abnormal results are displayed) Labs Reviewed  BASIC METABOLIC PANEL - Abnormal; Notable for the following components:      Result Value   Glucose, Bld 122 (*)    BUN 31 (*)    Creatinine, Ser 1.60 (*)    Calcium 8.6 (*)    GFR calc non Af Amer 29 (*)    GFR calc Af Amer 34 (*)    All other components within normal limits  CBC - Abnormal; Notable for the following components:   WBC 11.8 (*)    MCV 101.5 (*)    All other components within normal limits  URINALYSIS, ROUTINE W REFLEX MICROSCOPIC - Abnormal; Notable for the following components:   Color, Urine AMBER (*)    APPearance TURBID (*)    Hgb urine dipstick MODERATE (*)    Ketones, ur 20 (*)    Protein, ur 100 (*)    Leukocytes, UA MODERATE (*)    WBC, UA >50 (*)    Bacteria, UA MANY (*)    All other components within normal limits  CK    EKG EKG Interpretation  Date/Time:  Monday July 17 2018 08:51:32 EST Ventricular Rate:  83 PR Interval:    QRS Duration: 95 QT Interval:  361 QTC Calculation: 425 R Axis:   8 Text Interpretation:  Sinus rhythm Atrial premature complex No significant change since last tracing Confirmed by Lajean Saver [phone]) on 07/17/2018 8:56:35 AM   Radiology Dg Chest 1 View  Result Date: 07/17/2018 CLINICAL DATA:  Fall. EXAM: CHEST  1 VIEW COMPARISON:  Chest x-ray dated May 13, 2017. FINDINGS: Stable cardiomediastinal silhouette. Normal pulmonary vascularity. No focal consolidation, pleural effusion, or pneumothorax. No acute osseous abnormality. IMPRESSION: No active disease. Electronically Signed   By: Titus Dubin M.D.   On: 07/17/2018 09:45   Ct Head Wo Contrast  Result Date:  07/17/2018 CLINICAL DATA:  Unwitnessed fall. On anticoagulation. History of dementia. EXAM: CT HEAD WITHOUT CONTRAST TECHNIQUE: Contiguous axial images were obtained from the base of the skull through the vertex without intravenous contrast. COMPARISON:  04/29/2018 FINDINGS: Brain: There is no evidence of acute infarct, intracranial hemorrhage, mass, midline shift, or extra-axial fluid collection. Moderately large chronic right MCA and right PCA infarcts are unchanged. There is also an unchanged chronic left caudate infarct. Patchy hypodensities elsewhere in the cerebral white matter bilaterally are unchanged and nonspecific but compatible with moderate chronic small vessel ischemic disease. Cerebral atrophy is unchanged, and there is wallerian degeneration involving the right cerebral peduncle. Small chronic infarcts are also again seen in the right cerebellum. Vascular: Calcified atherosclerosis at the skull base. Skull: No fracture or focal osseous lesion. Sinuses/Orbits: Mild mucosal thickening in the paranasal sinuses. Clear mastoid air cells. Bilateral cataract extraction. Other: None. IMPRESSION: 1. No evidence of acute intracranial abnormality. 2. Chronic ischemic changes as above. Electronically Signed   By: Logan Bores M.D.   On: 07/17/2018 12:36   Dg Hip Unilat W Or W/o Pelvis 2-3 Views Left  Result Date: 07/17/2018 CLINICAL DATA:  Left hip pain after fall. EXAM: DG HIP (WITH OR WITHOUT PELVIS) 2-3V LEFT COMPARISON:  None. FINDINGS: Healed left intertrochanteric femur fracture status post cephalomedullary rod placement. No definite acute fracture. No dislocation. Mild degenerative changes of both hip joints. The pubic symphysis and sacroiliac joints are intact. Osteopenia. Soft tissues are unremarkable. Foley catheter in place. IMPRESSION: 1. Prior left hip ORIF without definite acute fracture. Electronically Signed   By: Titus Dubin M.D.   On: 07/17/2018 09:50    Procedures Procedures  (including critical care time)  Medications Ordered in ED Medications  0.9 %  sodium chloride infusion (10 mL/hr Intravenous Rate/Dose Change 07/17/18 1131)  amoxicillin-clavulanate (AUGMENTIN) 875-125 MG per tablet 1 tablet (has no administration in time range)  sodium chloride 0.9 % bolus 500 mL (0 mLs Intravenous Stopped 07/17/18 1131)     Initial Impression / Assessment and Plan / ED Course  I have  reviewed the triage vital signs and the nursing notes.  Pertinent labs & imaging results that were available during my care of the patient were reviewed by me and considered in my medical decision making (see chart for details).  Clinical Course as of Jul 17 1500  Mon Jul 17, 2018  1002 84 yo female presenting from nursing home  unable to provide hx with baseline dementia after fall from bed, found facedown unknown amt of time. Pain in left hip and neck. Neck non-tender and c-spine cleared.    [JS]  1761 Chest x-ray and hip x-ray within normal limits. AKI with creatinine of 1.4. History of CKD with unclear baseline and chronic foley catheter. She has a history of chronic UTI's and is being treated with macrobid. Previous ED visit non-sensitive.    [JS]  6073 After discussion with family they spoke further with [facility] facility and patient possibly hit head. Obtained head CT which did not show acute injury. Unknown time down, CK pending. UA positive for leuks, WBCs, and hgb although she has a chronic foley   [JS]    Clinical Course User Index [JS] Tyge Somers A, DO    CT and chest xray with no acute findings or injuries status post fall. CK was within normal limits. She appears at her baseline mental status although exact baseline is unclear. UA positive for UTI with previous ED visit UA not susceptible to macrobid. She is allergic to bactrim and cipro, will prescribe augmentin. She is appropriate for discharge at this time.   Final Clinical Impressions(s) / ED Diagnoses   Final  diagnoses:  Urinary tract infection associated with indwelling urethral catheter, initial encounter [facility] [place])  Fall, initial encounter  Left leg pain    ED Discharge Orders         Ordered    amoxicillin-clavulanate (AUGMENTIN) 875-125 MG tablet  2 times daily     07/17/18 1354           Krisalyn Yankowski A, DO 07/17/18 1504    Lajean Saver, MD 07/18/18 [phone]

## 2018-07-17 NOTE — ED Notes (Signed)
Pt returns from xray. IV at right will not flush.

## 2018-07-17 NOTE — ED Notes (Signed)
Ptcontinues to wait for [facility]. ptoffered food but given water at her request.Declinesfood.

## 2018-07-17 NOTE — Discharge Instructions (Addendum)
Victoria Walsh was found to still have a UTI associated with her indwelling catheter and is currently on nitrofurantoin for chronic UTIs. On her previous ED visit infection was not susceptible to this medication, and we will add an additional antibiotic for 10 days.   Please take Augmentin (amoxicillin-clavulanate) 875 mg tablet every 12 hours for ten days.   Imaging of her head, hip, and chest were negative for acute injury. Please provide tylenol every six hours as needed for left leg pain.

## 2018-07-17 NOTE — ED Triage Notes (Signed)
Pt arrives EMS from [facility] assisted living where she had unwitnessed fall and found face down on floor. Pt arrives with c-collar. Pt c/o pain at left hip and left leg noted as shortened and rotated  Inward. bilat dorsalis pedis pulses. Pt uses wc at facility. Hx of dementia. Afib noted by EMS. On blood thinners for hx cva.

## 2018-07-17 NOTE — ED Notes (Signed)
Pt daughter in law calls. Pt gives verbal permission to discuss with Daughter in law. DIL state staff had told her that pt had hit head. Update given to DIL. Same info given to provider

## 2018-07-17 NOTE — ED Notes (Signed)
Foley drainage bag changed from leg bag to standard drainage bag. Bag placed below level of bladder with no dependent loops in tubing. Cleaned with foley post insertion care wipes.

## 2018-07-17 NOTE — ED Notes (Signed)
Ptgiven watrer and tolerates po fluids well.

## 2018-09-17 ENCOUNTER — Encounter (HOSPITAL_COMMUNITY): Payer: Self-pay

## 2018-09-17 ENCOUNTER — Emergency Department (HOSPITAL_COMMUNITY)
Admission: EM | Admit: 2018-09-17 | Discharge: 2018-09-17 | Disposition: A | Discharge status: Home / Self Care | Payer: Medicare Other | Attending: Emergency Medicine | Admitting: Emergency Medicine

## 2018-09-17 DIAGNOSIS — N390 Urinary tract infection, site not specified: Secondary | ICD-10-CM | POA: Insufficient documentation

## 2018-09-17 DIAGNOSIS — Z960 Presence of urogenital implants: Secondary | ICD-10-CM

## 2018-09-17 DIAGNOSIS — Z7901 Long term (current) use of anticoagulants: Secondary | ICD-10-CM | POA: Diagnosis not present

## 2018-09-17 DIAGNOSIS — Z79899 Other long term (current) drug therapy: Secondary | ICD-10-CM | POA: Diagnosis not present

## 2018-09-17 DIAGNOSIS — R319 Hematuria, unspecified: Secondary | ICD-10-CM

## 2018-09-17 DIAGNOSIS — K921 Melena: Secondary | ICD-10-CM | POA: Insufficient documentation

## 2018-09-17 DIAGNOSIS — Z978 Presence of other specified devices: Secondary | ICD-10-CM

## 2018-09-17 DIAGNOSIS — F0390 Unspecified dementia without behavioral disturbance: Secondary | ICD-10-CM | POA: Insufficient documentation

## 2018-09-17 DIAGNOSIS — N189 Chronic kidney disease, unspecified: Secondary | ICD-10-CM | POA: Insufficient documentation

## 2018-09-17 DIAGNOSIS — R300 Dysuria: Secondary | ICD-10-CM

## 2018-09-17 LAB — COMPREHENSIVE METABOLIC PANEL
ALT: 17 U/L (ref 0–44)
AST: 19 U/L (ref 15–41)
Albumin: 4 g/dL (ref 3.5–5.0)
Alkaline Phosphatase: 62 U/L (ref 38–126)
Anion gap: 7 (ref 5–15)
BILIRUBIN TOTAL: 0.6 mg/dL (ref 0.3–1.2)
BUN: 22 mg/dL (ref 8–23)
CO2: 24 mmol/L (ref 22–32)
Calcium: 8.6 mg/dL — ABNORMAL LOW (ref 8.9–10.3)
Chloride: 108 mmol/L (ref 98–111)
Creatinine, Ser: 1.03 mg/dL — ABNORMAL HIGH (ref 0.44–1.00)
GFR calc Af Amer: 58 mL/min — ABNORMAL LOW (ref >60)
GFR calc non Af Amer: 50 mL/min — ABNORMAL LOW (ref >60)
Glucose, Bld: 92 mg/dL (ref 70–99)
Potassium: 3.9 mmol/L (ref 3.5–5.1)
Sodium: 139 mmol/L (ref 135–145)
Total Protein: 6.6 g/dL (ref 6.5–8.1)

## 2018-09-17 LAB — CBC
HCT: 35 % — ABNORMAL LOW (ref 36.0–46.0)
Hemoglobin: 10.9 g/dL — ABNORMAL LOW (ref 12.0–15.0)
MCH: 31.7 pg (ref 26.0–34.0)
MCHC: 31.1 g/dL (ref 30.0–36.0)
MCV: 101.7 fL — ABNORMAL HIGH (ref 80.0–100.0)
Platelets: 170 10*3/uL (ref 150–400)
RBC: 3.44 MIL/uL — ABNORMAL LOW (ref 3.87–5.11)
RDW: 12.6 % (ref 11.5–15.5)
WBC: 7.2 10*3/uL (ref 4.0–10.5)
nRBC: 0 % (ref 0.0–0.2)

## 2018-09-17 LAB — URINALYSIS, ROUTINE W REFLEX MICROSCOPIC
Bilirubin Urine: NEGATIVE
Glucose, UA: NEGATIVE mg/dL
Ketones, ur: NEGATIVE mg/dL
Nitrite: POSITIVE — AB
PH: 7 (ref 5.0–8.0)
Protein, ur: >=300 mg/dL — AB
RBC / HPF: >50 RBC/hpf — ABNORMAL HIGH (ref 0–5)
Specific Gravity, Urine: 1.014 (ref 1.005–1.030)
WBC, UA: >50 WBC/hpf — ABNORMAL HIGH (ref 0–5)

## 2018-09-17 LAB — POC OCCULT BLOOD, ED: Fecal Occult Bld: NEGATIVE

## 2018-09-17 LAB — ABO/RH: ABO/RH(D): B POS

## 2018-09-17 LAB — TYPE AND SCREEN
ABO/RH(D): B POS
Antibody Screen: NEGATIVE

## 2018-09-17 MED ORDER — ACETAMINOPHEN 325 MG PO TABS
650.0000 mg | ORAL_TABLET | Freq: Once | ORAL | Status: AC
  Administered 2018-09-17: 650 mg via ORAL
  Filled 2018-09-17: qty 2

## 2018-09-17 MED ORDER — CEPHALEXIN 500 MG PO CAPS: 500.0000 mg | ORAL_CAPSULE | Freq: Four times a day (QID) | ORAL | 0 refills | Status: AC

## 2018-09-17 NOTE — ED Provider Notes (Signed)
  Face-to-face evaluation   History: She presents for evaluation of blood in urine and tarry stool.  He is unable to give any history.  Currently she denies pain.  Physical exam: Elderly female who appears comfortable.  Heart regular rate and rhythm without murmur lungs clear anteriorly.  Abdomen soft and nontender to palpation.  Medical screening examination/treatment/procedure(s) were conducted as a shared visit with non-physician practitioner(s) and myself.  I personally evaluated the patient during the encounter    Daleen Bo, MD 09/17/18 1517

## 2018-09-17 NOTE — ED Triage Notes (Addendum)
Patient arrived via [facility] from [facility].   Patient gets catheter placed every 12 days (for urine retention).  New catheter put in Wednesday. Patient c/o new pain with it. Patient has had blood in urine x2 days and dark stool X 1 day.    A/Ox4 Wheelchair bound   Hx. Stroke w/ left residual weakness   No altered mental from norm

## 2018-09-17 NOTE — ED Notes (Signed)
[facility] has been dispatched.  

## 2018-09-17 NOTE — Discharge Instructions (Addendum)
Do not take Xarelto for the next 3 days. Take antibiotics as prescribed.  Take the entire course. Follow-up with your primary care doctor in 3 days for hemoglobin recheck. Return to the emergency room with any new, worsening, or concerning symptoms.

## 2018-09-17 NOTE — ED Provider Notes (Signed)
[facility] DEPT Provider Note   CSN: [id number] Arrival date & time: 09/17/18  1136     History   Chief Complaint Chief Complaint  Patient presents with  . Hematuria  . Rectal Bleeding    HPI Victoria Walsh is a 85 y.o. female presenting for evaluation of hematuria, dysuria, and rectal bleeding.  Level 5 caveat, patient with a history of dementia.  Per facility staff, patient was complaining of dysuria today.  When he emptied the Foley bag, there was "lots of blood."  Additionally, when she was being cleaned she had a bowel movement, and staff describes it as tarry in color.  As such, she was sent to the ER.  No known recent fevers or chills.  No recent complaints of abdominal pain.  Patient with a history of frequent UTIs, and has a Foley catheter placed every 12 days for urinary retention.  Wheelchair-bound with left-sided weakness after a stroke.   Initial history obtained from chart review.  Patient with a history of dementia, CVA, CKD.  Patient is on Xarelto, no documented h/o a fib or other reason identified for blood thinner.   HPI  Past Medical History:  Diagnosis Date  . Chronic kidney disease   . CVA (cerebral infarction)   . Dementia ([place])   . Hyperlipidemia   . Osteopenia   . UTI (lower urinary tract infection)     There are no active problems to display for this patient.   Past Surgical History:  Procedure Laterality Date  . ABDOMINAL HYSTERECTOMY    . BREAST LUMPECTOMY    . REPLACEMENT TOTAL KNEE    . TOTAL HIP ARTHROPLASTY       OB History   No obstetric history on file.      Home Medications    Prior to Admission medications   Medication Sig Start Date End Date Taking? Authorizing Provider  acetaminophen (TYLENOL) 325 MG tablet Take 325 mg by mouth 2 (two) times daily.    [provider]  brimonidine-timolol (COMBIGAN) 0.2-0.5 % ophthalmic solution Place 1 drop into the left eye every 12 (twelve) hours.     [provider]  brinzolamide (AZOPT) 1 % ophthalmic suspension Place 1 drop into the left eye 2 (two) times daily.    [provider]  CAPSAICIN ARTHRITIS RELIEF EX Apply 1 application topically 2 (two) times daily. Apply to top of left foot and leg topically twice daily for pain.    [provider]  cephALEXin (KEFLEX) 500 MG capsule Take 1 capsule (500 mg total) by mouth 4 (four) times daily for 7 days. 09/17/18 09/24/18  Kaliann Coryell, PA-C  Cranberry 450 MG CAPS Take 450 mg by mouth 2 (two) times daily.    [provider]  docusate sodium (COLACE) 100 MG capsule Take 100 mg by mouth daily.    [provider]  DULoxetine (CYMBALTA) 20 MG capsule Take 20 mg by mouth at bedtime.     [provider]  famotidine (PEPCID) 20 MG tablet Take 20 mg by mouth at bedtime.    [provider]  latanoprost (XALATAN) 0.005 % ophthalmic solution Place 1 drop into the left eye at bedtime.    [provider]  letrozole (FEMARA) 2.5 MG tablet Take 2.5 mg by mouth daily.    [provider]  levothyroxine (SYNTHROID, LEVOTHROID) 100 MCG tablet Take 100 mcg by mouth daily before breakfast.     [provider]  Multiple Vitamins-Iron (MULTIVITAMINS WITH  IRON) TABS tablet Take 1 tablet by mouth at bedtime.    [provider]  Multiple Vitamins-Minerals (ICAPS AREDS 2) CAPS Take 1 capsule by mouth daily.    [provider]  pregabalin (LYRICA) 25 MG capsule Take 25 mg by mouth at bedtime.    [provider]  Psyllium 500 MG CAPS Take 500 mg by mouth at bedtime.     [provider]  rivaroxaban (XARELTO) 20 MG TABS tablet Take 20 mg by mouth daily with supper.    [provider]  traMADol (ULTRAM) 50 MG tablet Take 25 mg by mouth 2 (two) times daily.     [provider]    Family History History reviewed. No pertinent family history.  Social History Social History    Tobacco Use  . Smoking status: Never Smoker  . Smokeless tobacco: Never Used  Substance Use Topics  . Alcohol use: Yes    Comment: socially: Once every month  . Drug use: No     Allergies   Ciprofloxacin; Oxaprozin; and Sulfa antibiotics   Review of Systems Review of Systems  Unable to perform ROS: Dementia  Gastrointestinal: Positive for blood in stool.  Genitourinary: Positive for dysuria and hematuria.     Physical Exam Updated Vital Signs BP (!) 157/64   Pulse 70   Temp 97.9 F (36.6 C) (Oral)   Resp 17   SpO2 98%   Physical Exam Vitals signs and nursing note reviewed. Exam conducted with a chaperone present.  Constitutional:      General: She is not in acute distress.    Appearance: She is well-developed.     Comments: Appears nontoxic  HENT:     Head: Normocephalic and atraumatic.  Eyes:     Conjunctiva/sclera: Conjunctivae normal.     Pupils: Pupils are equal, round, and reactive to light.  Neck:     Musculoskeletal: Normal range of motion and neck supple.  Cardiovascular:     Rate and Rhythm: Normal rate and regular rhythm.  Pulmonary:     Effort: Pulmonary effort is normal. No respiratory distress.     Breath sounds: Normal breath sounds. No wheezing.  Abdominal:     General: There is no distension.     Palpations: Abdomen is soft. There is no mass.     Tenderness: There is no abdominal tenderness. There is no right CVA tenderness, left CVA tenderness, guarding or rebound.     Comments: No ttp of abd. Soft without rigidity, guarding, or distention.   Genitourinary:    Rectum: Guaiac result negative.     Comments: No gross blood noted on rectal exam.  Stool without melena or hematochezia. Hemoccult negative  Musculoskeletal: Normal range of motion.  Skin:    General: Skin is warm and dry.     Capillary Refill: Capillary refill takes less than 2 seconds.  Neurological:     Mental Status: She is alert and oriented to person, place, and time.      Comments: Alert and oriented x3, but pleasantly confused. Baseline mental status per facility      ED Treatments / Results  Labs (all labs ordered are listed, but only abnormal results are displayed) Labs Reviewed  URINALYSIS, ROUTINE W REFLEX MICROSCOPIC - Abnormal; Notable for the following components:      Result Value   APPearance TURBID (*)    Hgb urine dipstick LARGE (*)    Protein, ur >=300 (*)    Nitrite POSITIVE (*)  Leukocytes, UA LARGE (*)    RBC / HPF >50 (*)    WBC, UA >50 (*)    Bacteria, UA MANY (*)    All other components within normal limits  COMPREHENSIVE METABOLIC PANEL - Abnormal; Notable for the following components:   Creatinine, Ser 1.03 (*)    Calcium 8.6 (*)    GFR calc non Af Amer 50 (*)    GFR calc Af Amer 58 (*)    All other components within normal limits  CBC - Abnormal; Notable for the following components:   RBC 3.44 (*)    Hemoglobin 10.9 (*)    HCT 35.0 (*)    MCV 101.7 (*)    All other components within normal limits  URINE CULTURE  POC OCCULT BLOOD, ED  TYPE AND SCREEN  ABO/RH    EKG None  Radiology No results found.  Procedures Procedures (including critical care time)  Medications Ordered in ED Medications  acetaminophen (TYLENOL) tablet 650 mg (650 mg Oral Given 09/17/18 1430)     Initial Impression / Assessment and Plan / ED Course  I have reviewed the triage vital signs and the nursing notes.  Pertinent labs & imaging results that were available during my care of the patient were reviewed by me and considered in my medical decision making (see chart for details).     Patient presenting for evaluation of dysuria and hematuria.  Additionally, staff states they saw tarry stools this morning.  Exam, patient is afebrile not tachycardic.  Appears nontoxic.  Rectal exam without gross blood, tarry stools, and Hemoccult negative.  As such, low suspicion for persistent or continuing rectal bleed.  However, patient is on  Xarelto.  We will have her hold for 3 days and have hemoglobin rechecked. Hemoglobin mildly low at 10.9, but she does not need a transfusion currently, and without continued bleeding, I do not believe she needs admission/obvs for hgb trending at this time. UA positive to UTI, will culture. As pt with new hematuria and complaints of dysuria, will tx despite chronic indwelling catheter. Case discussed with attending, Dr. Eulis Foster evaluated the pt and agrees to plan. At this time, pt appears safe for d/c. Return precautions given.    Final Clinical Impressions(s) / ED Diagnoses   Final diagnoses:  Dysuria  Hematuria, unspecified type  Chronic indwelling Foley catheter    ED Discharge Orders         Ordered    cephALEXin (KEFLEX) 500 MG capsule  4 times daily     09/17/18 1411           Earley Grobe, PA-C 09/17/18 1507    Daleen Bo, MD 09/17/18 1517

## 2018-09-17 NOTE — ED Notes (Signed)
Bed: WA09 Expected date:  Expected time:  Means of arrival:  Comments: 85 yo hematuria, rectal bleeding

## 2018-09-21 LAB — URINE CULTURE: Culture: >=100000 — AB

## 2018-09-22 ENCOUNTER — Telehealth: Payer: Self-pay

## 2018-09-22 NOTE — Progress Notes (Signed)
ED Antimicrobial Stewardship Positive Culture Follow Up   Victoria Walsh is an 85 y.o. female who presented to [facility] on 09/17/2018 with a chief complaint of  Chief Complaint  Patient presents with  . Hematuria  . Rectal Bleeding    Recent Results (from the past 720 hour(s))  Urine culture     Status: Abnormal   Collection Time: 09/17/18 11:51 AM  Result Value Ref Range Status   Specimen Description   Final    URINE, CATHETERIZED Performed at [facility] [address], [place], [place] [address]    Special Requests   Final    NONE Performed at [facility], [place] [address] [place], [place] [address]    Culture (A)  Final    >=100,000 COLONIES/mL ESCHERICHIA COLI Confirmed Extended Spectrum Beta-Lactamase Producer (ESBL).  In bloodstream infections from ESBL organisms, carbapenems are preferred over piperacillin/tazobactam. They are shown to have a lower risk of mortality. >=100,000 COLONIES/mL GRAM NEGATIVE RODS    Report Status 09/21/2018 FINAL  Final   Organism ID, Bacteria ESCHERICHIA COLI (A)  Final   Organism ID, Bacteria GRAM NEGATIVE RODS (A)  Final      Susceptibility   Escherichia coli - MIC*    AMPICILLIN >=32 RESISTANT Resistant     CEFAZOLIN >=64 RESISTANT Resistant     CEFTRIAXONE >=64 RESISTANT Resistant     CIPROFLOXACIN >=4 RESISTANT Resistant     GENTAMICIN <=1 SENSITIVE Sensitive     IMIPENEM <=0.25 SENSITIVE Sensitive     NITROFURANTOIN <=16 SENSITIVE Sensitive     TRIMETH/SULFA <=20 SENSITIVE Sensitive     AMPICILLIN/SULBACTAM 4 SENSITIVE Sensitive     PIP/TAZO <=4 SENSITIVE Sensitive     Extended ESBL POSITIVE Resistant     * >=100,000 COLONIES/mL ESCHERICHIA COLI   Gram negative rods - MIC*    AMPICILLIN RESISTANT Resistant     CEFAZOLIN RESISTANT Resistant     CEFTRIAXONE RESISTANT Resistant     CIPROFLOXACIN <=0.25 SENSITIVE Sensitive     GENTAMICIN <=1 SENSITIVE Sensitive     IMIPENEM 2 SENSITIVE  Sensitive     NITROFURANTOIN 128 RESISTANT Resistant     TRIMETH/SULFA <=20 SENSITIVE Sensitive     AMPICILLIN/SULBACTAM <=2 SENSITIVE Sensitive     PIP/TAZO <=4 SENSITIVE Sensitive     * >=100,000 COLONIES/mL GRAM NEGATIVE RODS   Presented with hematuria for 2 days with abdominal pain. Has chronic catheter (had been changed 4 days prior). Has history of frequent UTI.   [x]  Treated with Cephalexin, organism resistant to prescribed antimicrobial []  Patient discharged originally without antimicrobial agent and treatment is now indicated  New antibiotic prescription: Augmentin 500 mg twice daily ONLY if continues to have symptoms  ED Provider: Florence 09/22/2018, 11:36 AM Clinical Pharmacist Monday - Friday phone -  [phone] Saturday - Sunday phone - [phone]

## 2018-09-22 NOTE — Telephone Encounter (Signed)
Pt at [facility]. UC report from ED 09/17/2018 Faxed at [facility].   Needs change in Abx to Augmentin 500mg  BID x 7 days

## 2018-11-12 ENCOUNTER — Emergency Department (HOSPITAL_COMMUNITY)
Admission: EM | Admit: 2018-11-12 | Discharge: 2018-11-12 | Disposition: A | Discharge status: Home / Self Care | Payer: Medicare Other | Attending: Emergency Medicine | Admitting: Emergency Medicine

## 2018-11-12 ENCOUNTER — Other Ambulatory Visit: Payer: Self-pay

## 2018-11-12 ENCOUNTER — Encounter (HOSPITAL_COMMUNITY): Payer: Self-pay

## 2018-11-12 DIAGNOSIS — R319 Hematuria, unspecified: Secondary | ICD-10-CM | POA: Diagnosis present

## 2018-11-12 DIAGNOSIS — R310 Gross hematuria: Secondary | ICD-10-CM | POA: Diagnosis not present

## 2018-11-12 DIAGNOSIS — N189 Chronic kidney disease, unspecified: Secondary | ICD-10-CM | POA: Diagnosis not present

## 2018-11-12 DIAGNOSIS — Z79899 Other long term (current) drug therapy: Secondary | ICD-10-CM | POA: Diagnosis not present

## 2018-11-12 DIAGNOSIS — Z8673 Personal history of transient ischemic attack (TIA), and cerebral infarction without residual deficits: Secondary | ICD-10-CM | POA: Diagnosis not present

## 2018-11-12 DIAGNOSIS — F0390 Unspecified dementia without behavioral disturbance: Secondary | ICD-10-CM | POA: Diagnosis not present

## 2018-11-12 DIAGNOSIS — Z96649 Presence of unspecified artificial hip joint: Secondary | ICD-10-CM | POA: Insufficient documentation

## 2018-11-12 DIAGNOSIS — Z96659 Presence of unspecified artificial knee joint: Secondary | ICD-10-CM | POA: Diagnosis not present

## 2018-11-12 LAB — CBC WITH DIFFERENTIAL/PLATELET
ABS IMMATURE GRANULOCYTES: 0.02 10*3/uL (ref 0.00–0.07)
Basophils Absolute: 0.1 10*3/uL (ref 0.0–0.1)
Basophils Relative: 1 %
Eosinophils Absolute: 0.2 10*3/uL (ref 0.0–0.5)
Eosinophils Relative: 3 %
HCT: 36.6 % (ref 36.0–46.0)
HEMOGLOBIN: 11.4 g/dL — AB (ref 12.0–15.0)
Immature Granulocytes: 0 %
Lymphocytes Relative: 18 %
Lymphs Abs: 1.4 10*3/uL (ref 0.7–4.0)
MCH: 31.7 pg (ref 26.0–34.0)
MCHC: 31.1 g/dL (ref 30.0–36.0)
MCV: 101.7 fL — ABNORMAL HIGH (ref 80.0–100.0)
MONOS PCT: 11 %
Monocytes Absolute: 0.9 10*3/uL (ref 0.1–1.0)
NEUTROS ABS: 5.3 10*3/uL (ref 1.7–7.7)
Neutrophils Relative %: 67 %
Platelets: 175 10*3/uL (ref 150–400)
RBC: 3.6 MIL/uL — ABNORMAL LOW (ref 3.87–5.11)
RDW: 13.1 % (ref 11.5–15.5)
WBC: 7.9 10*3/uL (ref 4.0–10.5)
nRBC: 0 % (ref 0.0–0.2)

## 2018-11-12 LAB — BASIC METABOLIC PANEL
Anion gap: 5 (ref 5–15)
BUN: 20 mg/dL (ref 8–23)
CO2: 25 mmol/L (ref 22–32)
Calcium: 8.9 mg/dL (ref 8.9–10.3)
Chloride: 110 mmol/L (ref 98–111)
Creatinine, Ser: 0.86 mg/dL (ref 0.44–1.00)
GFR calc Af Amer: >60 mL/min (ref >60)
GFR calc non Af Amer: >60 mL/min (ref >60)
GLUCOSE: 91 mg/dL (ref 70–99)
Potassium: 4.2 mmol/L (ref 3.5–5.1)
Sodium: 140 mmol/L (ref 135–145)

## 2018-11-12 LAB — URINALYSIS, ROUTINE W REFLEX MICROSCOPIC
Bacteria, UA: NONE SEEN
Bilirubin Urine: NEGATIVE
Glucose, UA: NEGATIVE mg/dL
KETONES UR: NEGATIVE mg/dL
LEUKOCYTE UA: NEGATIVE
Nitrite: NEGATIVE
Protein, ur: 100 mg/dL — AB
Specific Gravity, Urine: 1.015 (ref 1.005–1.030)
pH: 5 (ref 5.0–8.0)

## 2018-11-12 NOTE — ED Notes (Signed)
Bed: WA20 Expected date:  Expected time:  Means of arrival:  Comments: 85 yo hematuria

## 2018-11-12 NOTE — Discharge Instructions (Signed)
Your urinalysis does not appear to be consistent with infection at this time.  However it was sent for a culture which should be resulted in the next few days.  You should hold your Xarelto for the next 2 days.  Return here as needed for any worsening symptoms.

## 2018-11-12 NOTE — ED Provider Notes (Addendum)
[facility] DEPT Provider Note   CSN: [id number] Arrival date & time: 11/12/18  1136    History   Chief Complaint Chief Complaint  Patient presents with  . Hematuria    HPI Victoria Walsh is a 85 y.o. female.     Patient is a 85 year old female who presents with hematuria.  History is limited due to her dementia.  She has a chronic indwelling Foley catheter.  Reportedly changed every 2 weeks.  She was sent here from her nursing facility with hematuria.  She denies any abdominal pain.  She says that she otherwise feels okay.  She denies any known fevers.  She has been treated for similar symptoms with antibiotics for UTIs.  Of note she is on Xarelto.     Past Medical History:  Diagnosis Date  . Chronic kidney disease   . CVA (cerebral infarction)   . Dementia ([place])   . Hyperlipidemia   . Osteopenia   . UTI (lower urinary tract infection)     There are no active problems to display for this patient.   Past Surgical History:  Procedure Laterality Date  . ABDOMINAL HYSTERECTOMY    . BREAST LUMPECTOMY    . REPLACEMENT TOTAL KNEE    . TOTAL HIP ARTHROPLASTY       OB History   No obstetric history on file.      Home Medications    Prior to Admission medications   Medication Sig Start Date End Date Taking? Authorizing Provider  acetaminophen (TYLENOL) 325 MG tablet Take 325 mg by mouth 2 (two) times daily.    [provider]  brimonidine-timolol (COMBIGAN) 0.2-0.5 % ophthalmic solution Place 1 drop into the left eye every 12 (twelve) hours.    [provider]  brinzolamide (AZOPT) 1 % ophthalmic suspension Place 1 drop into the left eye 2 (two) times daily.    [provider]  CAPSAICIN ARTHRITIS RELIEF EX Apply 1 application topically 2 (two) times daily. Apply to top of left foot and leg topically twice daily for pain.    [provider]  Cranberry 450 MG CAPS Take 450 mg by mouth 2 (two) times daily.     [provider]  docusate sodium (COLACE) 100 MG capsule Take 100 mg by mouth daily.    [provider]  DULoxetine (CYMBALTA) 20 MG capsule Take 20 mg by mouth at bedtime.     [provider]  famotidine (PEPCID) 20 MG tablet Take 20 mg by mouth at bedtime.    [provider]  latanoprost (XALATAN) 0.005 % ophthalmic solution Place 1 drop into the left eye at bedtime.    [provider]  letrozole (FEMARA) 2.5 MG tablet Take 2.5 mg by mouth daily.    [provider]  levothyroxine (SYNTHROID, LEVOTHROID) 100 MCG tablet Take 100 mcg by mouth daily before breakfast.     [provider]  Multiple Vitamins-Iron (MULTIVITAMINS WITH IRON) TABS tablet Take 1 tablet by mouth at bedtime.    [provider]  Multiple Vitamins-Minerals (ICAPS AREDS 2) CAPS Take 1 capsule by mouth daily.    [provider]  pregabalin (LYRICA) 25 MG capsule Take 25 mg by mouth at bedtime.    [provider]  Psyllium 500 MG CAPS Take 500 mg by mouth at bedtime.     [provider]  rivaroxaban (XARELTO) 20 MG TABS tablet Take 20 mg by mouth daily with supper.    [provider]  traMADol (ULTRAM) 50 MG tablet Take 25 mg by mouth 2 (two) times daily.     [provider]    Family History History reviewed. No pertinent family history.  Social History Social History   Tobacco Use  . Smoking status: Never Smoker  . Smokeless tobacco: Never Used  Substance Use Topics  . Alcohol use: Yes    Comment: socially: Once every month  . Drug use: No     Allergies   Ciprofloxacin; Oxaprozin; and Sulfa antibiotics   Review of Systems Review of Systems  Unable to perform ROS: Dementia     Physical Exam Updated Vital Signs BP (!) 153/62   Pulse 62   Temp 98.1 F (36.7 C) (Oral)   Resp 16   Ht 5\' 8"  (1.727 m)   Wt 74.8 kg   SpO2 97%   BMI 25.09 kg/m   Physical Exam Constitutional:       Appearance: She is well-developed.  HENT:     Head: Normocephalic and atraumatic.  Eyes:     Pupils: Pupils are equal, round, and reactive to light.  Neck:     Musculoskeletal: Normal range of motion and neck supple.  Cardiovascular:     Rate and Rhythm: Normal rate and regular rhythm.     Heart sounds: Normal heart sounds.  Pulmonary:     Effort: Pulmonary effort is normal. No respiratory distress.     Breath sounds: Normal breath sounds. No wheezing or rales.  Chest:     Chest wall: No tenderness.  Abdominal:     General: Bowel sounds are normal.     Palpations: Abdomen is soft.     Tenderness: There is no abdominal tenderness. There is no guarding or rebound.  Genitourinary:    Comments: Foley catheter is in place, there is dark bloody urine without clots in the bag Musculoskeletal: Normal range of motion.  Lymphadenopathy:     Cervical: No cervical adenopathy.  Skin:    General: Skin is warm and dry.     Findings: No rash.  Neurological:     Mental Status: She is alert and oriented to person, place, and time.      ED Treatments / Results  Labs (all labs ordered are listed, but only abnormal results are displayed) Labs Reviewed  CBC WITH DIFFERENTIAL/PLATELET - Abnormal; Notable for the following components:      Result Value   RBC 3.60 (*)    Hemoglobin 11.4 (*)    MCV 101.7 (*)    All other components within normal limits  URINALYSIS, ROUTINE W REFLEX MICROSCOPIC - Abnormal; Notable for the following components:   Color, Urine AMBER (*)    APPearance CLOUDY (*)    Hgb urine dipstick LARGE (*)    Protein, ur 100 (*)    RBC / HPF >50 (*)    All other components within normal limits  URINE CULTURE  BASIC METABOLIC PANEL    EKG None  Radiology No results found.  Procedures Procedures (including critical care time)  Medications Ordered in ED Medications - No data to display   Initial Impression / Assessment and Plan / ED Course  I have reviewed the  triage vital signs and the nursing notes.  Pertinent labs & imaging results that were available during my care of the patient were reviewed by me and considered in my medical decision making (see chart for details).        Patient is a 85 year old female who  presents with hematuria.  She does have bloody urine in her catheter bag but no clots.  Her urine surprisingly does not appear to be consistent with infection.  Her hemoglobin is stable.  Her creatinine is normal.  She does not have any associated abdominal pain which would be more concerning for renal colic.  Her urine appears to be draining normally.  Reportedly she recently finished a course of antibiotics.  I do not feel that this point she needs to be restarted on antibiotics.  Her urine was sent for culture.  She is on Xarelto and the nursing facility was advised to hold the Xarelto for the next 2 days and then restart.  Return precautions were given.  Final Clinical Impressions(s) / ED Diagnoses   Final diagnoses:  Gross hematuria    ED Discharge Orders    None       Malvin Johns, MD 11/12/18 1323    Malvin Johns, MD 11/12/18 1324

## 2018-11-12 NOTE — ED Triage Notes (Signed)
EMS reports from [facility], satff called for hematuria in catheter drainage bag x 2 weeks. Evaluated prior for same, Dx with UTI. Staff at facilty states she just finish ABX a "couple days ago". Pt c/o of mild pain at urethra.  BP 123/70 HR 68 RR 16 Sp02 97 RA

## 2018-11-13 LAB — URINE CULTURE: Culture: NO GROWTH

## 2019-04-26 ENCOUNTER — Encounter (HOSPITAL_COMMUNITY): Payer: Self-pay

## 2019-04-26 ENCOUNTER — Emergency Department (HOSPITAL_COMMUNITY): Payer: Medicare Other

## 2019-04-26 ENCOUNTER — Other Ambulatory Visit: Payer: Self-pay

## 2019-04-26 ENCOUNTER — Emergency Department (HOSPITAL_COMMUNITY)
Admission: EM | Admit: 2019-04-26 | Discharge: 2019-04-26 | Disposition: A | Discharge status: Home / Self Care | Payer: Medicare Other | Attending: Emergency Medicine | Admitting: Emergency Medicine

## 2019-04-26 DIAGNOSIS — S0121XA Laceration without foreign body of nose, initial encounter: Secondary | ICD-10-CM | POA: Insufficient documentation

## 2019-04-26 DIAGNOSIS — F0390 Unspecified dementia without behavioral disturbance: Secondary | ICD-10-CM | POA: Insufficient documentation

## 2019-04-26 DIAGNOSIS — Z79899 Other long term (current) drug therapy: Secondary | ICD-10-CM | POA: Insufficient documentation

## 2019-04-26 DIAGNOSIS — S0232XA Fracture of orbital floor, left side, initial encounter for closed fracture: Secondary | ICD-10-CM

## 2019-04-26 DIAGNOSIS — Y92129 Unspecified place in nursing home as the place of occurrence of the external cause: Secondary | ICD-10-CM | POA: Insufficient documentation

## 2019-04-26 DIAGNOSIS — Z23 Encounter for immunization: Secondary | ICD-10-CM | POA: Diagnosis not present

## 2019-04-26 DIAGNOSIS — S0240FA Zygomatic fracture, left side, initial encounter for closed fracture: Secondary | ICD-10-CM

## 2019-04-26 DIAGNOSIS — Z7901 Long term (current) use of anticoagulants: Secondary | ICD-10-CM | POA: Insufficient documentation

## 2019-04-26 DIAGNOSIS — Y999 Unspecified external cause status: Secondary | ICD-10-CM | POA: Diagnosis not present

## 2019-04-26 DIAGNOSIS — W1830XA Fall on same level, unspecified, initial encounter: Secondary | ICD-10-CM | POA: Diagnosis not present

## 2019-04-26 DIAGNOSIS — Y939 Activity, unspecified: Secondary | ICD-10-CM | POA: Insufficient documentation

## 2019-04-26 DIAGNOSIS — S0993XA Unspecified injury of face, initial encounter: Secondary | ICD-10-CM | POA: Diagnosis present

## 2019-04-26 DIAGNOSIS — S022XXA Fracture of nasal bones, initial encounter for closed fracture: Secondary | ICD-10-CM | POA: Diagnosis not present

## 2019-04-26 LAB — URINALYSIS, ROUTINE W REFLEX MICROSCOPIC
Bilirubin Urine: NEGATIVE
Glucose, UA: NEGATIVE mg/dL
Hgb urine dipstick: NEGATIVE
Ketones, ur: NEGATIVE mg/dL
Nitrite: POSITIVE — AB
Protein, ur: >=300 mg/dL — AB
Specific Gravity, Urine: 1.019 (ref 1.005–1.030)
pH: 8 (ref 5.0–8.0)

## 2019-04-26 LAB — CBC WITH DIFFERENTIAL/PLATELET
Abs Immature Granulocytes: 0.03 10*3/uL (ref 0.00–0.07)
Basophils Absolute: 0.1 10*3/uL (ref 0.0–0.1)
Basophils Relative: 1 %
Eosinophils Absolute: 0.3 10*3/uL (ref 0.0–0.5)
Eosinophils Relative: 4 %
HCT: 35.3 % — ABNORMAL LOW (ref 36.0–46.0)
Hemoglobin: 11.3 g/dL — ABNORMAL LOW (ref 12.0–15.0)
Immature Granulocytes: 0 %
Lymphocytes Relative: 18 %
Lymphs Abs: 1.6 10*3/uL (ref 0.7–4.0)
MCH: 32.4 pg (ref 26.0–34.0)
MCHC: 32 g/dL (ref 30.0–36.0)
MCV: 101.1 fL — ABNORMAL HIGH (ref 80.0–100.0)
Monocytes Absolute: 0.8 10*3/uL (ref 0.1–1.0)
Monocytes Relative: 9 %
Neutro Abs: 6.2 10*3/uL (ref 1.7–7.7)
Neutrophils Relative %: 68 %
Platelets: 215 10*3/uL (ref 150–400)
RBC: 3.49 MIL/uL — ABNORMAL LOW (ref 3.87–5.11)
RDW: 13.1 % (ref 11.5–15.5)
WBC: 9 10*3/uL (ref 4.0–10.5)
nRBC: 0 % (ref 0.0–0.2)

## 2019-04-26 LAB — COMPREHENSIVE METABOLIC PANEL
ALT: 11 U/L (ref 0–44)
AST: 25 U/L (ref 15–41)
Albumin: 3.5 g/dL (ref 3.5–5.0)
Alkaline Phosphatase: 73 U/L (ref 38–126)
Anion gap: 7 (ref 5–15)
BUN: 23 mg/dL (ref 8–23)
CO2: 25 mmol/L (ref 22–32)
Calcium: 8.7 mg/dL — ABNORMAL LOW (ref 8.9–10.3)
Chloride: 108 mmol/L (ref 98–111)
Creatinine, Ser: 1.04 mg/dL — ABNORMAL HIGH (ref 0.44–1.00)
GFR calc Af Amer: 57 mL/min — ABNORMAL LOW (ref >60)
GFR calc non Af Amer: 49 mL/min — ABNORMAL LOW (ref >60)
Glucose, Bld: 115 mg/dL — ABNORMAL HIGH (ref 70–99)
Potassium: 4.8 mmol/L (ref 3.5–5.1)
Sodium: 140 mmol/L (ref 135–145)
Total Bilirubin: 0.9 mg/dL (ref 0.3–1.2)
Total Protein: 5.8 g/dL — ABNORMAL LOW (ref 6.5–8.1)

## 2019-04-26 LAB — TROPONIN I (HIGH SENSITIVITY): Troponin I (High Sensitivity): 7 ng/L (ref <18)

## 2019-04-26 MED ORDER — LIDOCAINE-EPINEPHRINE (PF) 2 %-1:200000 IJ SOLN
INTRAMUSCULAR | Status: AC
  Administered 2019-04-26: 18:00:00
  Filled 2019-04-26: qty 20

## 2019-04-26 MED ORDER — TETANUS-DIPHTH-ACELL PERTUSSIS 5-2.5-18.5 LF-MCG/0.5 IM SUSP
0.5000 mL | Freq: Once | INTRAMUSCULAR | Status: AC
  Administered 2019-04-26: 0.5 mL via INTRAMUSCULAR
  Filled 2019-04-26: qty 0.5

## 2019-04-26 MED ORDER — SODIUM CHLORIDE 0.9 % IV SOLN
1.0000 g | Freq: Once | INTRAVENOUS | Status: AC
  Administered 2019-04-26: 1 g via INTRAVENOUS
  Filled 2019-04-26: qty 1

## 2019-04-26 MED ORDER — TRAMADOL HCL 50 MG PO TABS: 50.0000 mg | ORAL_TABLET | Freq: Two times a day (BID) | ORAL | 0 refills | Status: DC

## 2019-04-26 MED ORDER — CEPHALEXIN 500 MG PO CAPS: 500.0000 mg | ORAL_CAPSULE | Freq: Four times a day (QID) | ORAL | 0 refills | Status: DC
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
## 2019-04-26 NOTE — ED Notes (Signed)
Palmer pts mother in law; wants a pt update

## 2019-04-26 NOTE — ED Notes (Signed)
Called [facility] for transport to [facility]

## 2019-04-26 NOTE — ED Notes (Signed)
Patient transported to X-ray 

## 2019-04-26 NOTE — ED Notes (Signed)
Rec'd permission from Dr. Darl Householder to spk with family and provide results. Spk with dtr n law (Mrs. SADINA HAISTEN) regarding patient's status and updated on disposition.

## 2019-04-26 NOTE — ED Notes (Signed)
Pt discharged from ED via [facility],  instructions provided and scripts given; Pt encouraged to return to ED if symptoms worsen and to f/u with PCP; Pt verbalized understanding of all instructions
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
## 2019-04-26 NOTE — Discharge Instructions (Signed)
You have a facial fracture.   You have a laceration that is repaired and you need to put ice on it daily for 2 days   Expect facial swelling to increase over the next 2 days   You need to take keflex three times daily for a week   Increase tramadol to 50 mg twice daily for pain   Call Dr. Redmond Baseman' office for appointment   Return to ER if you have another fall, worse headaches, vomiting, uncontrolled pain, double vision, blurry vision

## 2019-04-29 LAB — URINE CULTURE: Culture: >=100000 — AB

## 2019-04-30 ENCOUNTER — Telehealth: Payer: Self-pay | Admitting: Emergency Medicine

## 2019-04-30 NOTE — Progress Notes (Signed)
ED Antimicrobial Stewardship Positive Culture Follow Up   Victoria Walsh is an 85 y.o. female who presented to [facility] on 04/26/2019 with a chief complaint of fall.This patient had no complaints of urinary symptoms and was afebrile. UA was slightly abnormal.   Recent Results (from the past 720 hour(s))  Urine culture     Status: Abnormal   Collection Time: 04/26/19  7:12 PM   Specimen: Urine, Catheterized  Result Value Ref Range Status   Specimen Description URINE, CATHETERIZED  Final   Special Requests   Final    NONE Performed at [facility], [address], [place], [place] [address]    Culture (A)  Final    >=100,000 COLONIES/mL PROTEUS MIRABILIS 80,000 COLONIES/mL ESCHERICHIA COLI ORGANISM 2 Confirmed Extended Spectrum Beta-Lactamase Producer (ESBL).  In bloodstream infections from ESBL organisms, carbapenems are preferred over piperacillin/tazobactam. They are shown to have a lower risk of mortality.    Report Status 04/29/2019 FINAL  Final   Organism ID, Bacteria PROTEUS MIRABILIS (A)  Final   Organism ID, Bacteria ESCHERICHIA COLI (A)  Final      Susceptibility   Escherichia coli - MIC*    AMPICILLIN >=32 RESISTANT Resistant     CEFAZOLIN >=64 RESISTANT Resistant     CEFTRIAXONE >=64 RESISTANT Resistant     CIPROFLOXACIN >=4 RESISTANT Resistant     GENTAMICIN <=1 SENSITIVE Sensitive     IMIPENEM <=0.25 SENSITIVE Sensitive     NITROFURANTOIN <=16 SENSITIVE Sensitive     TRIMETH/SULFA >=320 RESISTANT Resistant     AMPICILLIN/SULBACTAM >=32 RESISTANT Resistant     PIP/TAZO <=4 SENSITIVE Sensitive     Extended ESBL POSITIVE Resistant     * 80,000 COLONIES/mL ESCHERICHIA COLI   Proteus mirabilis - MIC*    AMPICILLIN <=2 SENSITIVE Sensitive     CEFAZOLIN <=4 SENSITIVE Sensitive     CEFTRIAXONE <=1 SENSITIVE Sensitive     CIPROFLOXACIN <=0.25 SENSITIVE Sensitive     GENTAMICIN <=1 SENSITIVE Sensitive     IMIPENEM 1 SENSITIVE Sensitive     NITROFURANTOIN 128  RESISTANT Resistant     TRIMETH/SULFA <=20 SENSITIVE Sensitive     AMPICILLIN/SULBACTAM <=2 SENSITIVE Sensitive     PIP/TAZO <=4 SENSITIVE Sensitive     * >=100,000 COLONIES/mL PROTEUS MIRABILIS    []  Treated with, organism resistant to prescribed antimicrobial []  Patient discharged originally without antimicrobial agent and treatment is now indicated  Plan: Instruct patient to discontinue Keflex. No further antibiotics needed.   ED Provider: Nuala Alpha, PA-C  Agnes Lawrence, PharmD PGY1 Pharmacy Resident  Monday - Friday phone -  [phone] Saturday - Sunday phone - [phone]

## 2019-04-30 NOTE — Telephone Encounter (Signed)
Post ED Visit - Positive Culture Follow-up: Successful Patient Follow-Up  Culture assessed and recommendations reviewed by:  []  Elenor Quinones, Pharm.D. []  Heide Guile, Pharm.D., BCPS AQ-ID []  Parks Neptune, Pharm.D., BCPS []  Alycia Rossetti, Pharm.D., BCPS []  [place], Pharm.D., BCPS, AAHIVP []  Legrand Como, Pharm.D., BCPS, AAHIVP []  Salome Arnt, PharmD, BCPS []  Johnnette Gourd, PharmD, BCPS []  Hughes Better, PharmD, BCPS []  Leeroy Cha, PharmD  Positive urine culture  []  Patient discharged without antimicrobial prescription and treatment is now indicated []  Organism is resistant to prescribed ED discharge antimicrobial []  Patient with positive blood cultures  Changes discussed with ED provider:Jeffrey Hedges New antibiotic prescription discontinue Keflex  Faxed to [facility] facility phone [phone]                                                          Fax [phone] nursing   Hazle Nordmann 04/30/2019, 11:02 AM

## 2019-09-18 ENCOUNTER — Emergency Department (HOSPITAL_COMMUNITY): Payer: Medicare Other

## 2019-09-18 ENCOUNTER — Other Ambulatory Visit: Payer: Self-pay

## 2019-09-18 ENCOUNTER — Encounter (HOSPITAL_COMMUNITY): Payer: Self-pay | Admitting: Internal Medicine

## 2019-09-18 ENCOUNTER — Inpatient Hospital Stay (HOSPITAL_COMMUNITY)
Admission: EM | Admit: 2019-09-18 | Discharge: 2019-09-25 | DRG: 872 | Disposition: A | Discharge status: Home / Self Care | Payer: Medicare Other | Source: Skilled Nursing Facility | Attending: Internal Medicine | Admitting: Internal Medicine

## 2019-09-18 DIAGNOSIS — R001 Bradycardia, unspecified: Secondary | ICD-10-CM | POA: Diagnosis not present

## 2019-09-18 DIAGNOSIS — I2510 Atherosclerotic heart disease of native coronary artery without angina pectoris: Secondary | ICD-10-CM

## 2019-09-18 DIAGNOSIS — I471 Supraventricular tachycardia: Secondary | ICD-10-CM | POA: Diagnosis present

## 2019-09-18 DIAGNOSIS — I69354 Hemiplegia and hemiparesis following cerebral infarction affecting left non-dominant side: Secondary | ICD-10-CM | POA: Diagnosis not present

## 2019-09-18 DIAGNOSIS — N170 Acute kidney failure with tubular necrosis: Secondary | ICD-10-CM | POA: Diagnosis not present

## 2019-09-18 DIAGNOSIS — N179 Acute kidney failure, unspecified: Secondary | ICD-10-CM

## 2019-09-18 DIAGNOSIS — K219 Gastro-esophageal reflux disease without esophagitis: Secondary | ICD-10-CM | POA: Diagnosis present

## 2019-09-18 DIAGNOSIS — Z960 Presence of urogenital implants: Secondary | ICD-10-CM | POA: Diagnosis present

## 2019-09-18 DIAGNOSIS — Z96659 Presence of unspecified artificial knee joint: Secondary | ICD-10-CM | POA: Diagnosis present

## 2019-09-18 DIAGNOSIS — I10 Essential (primary) hypertension: Secondary | ICD-10-CM | POA: Diagnosis not present

## 2019-09-18 DIAGNOSIS — I480 Paroxysmal atrial fibrillation: Secondary | ICD-10-CM | POA: Diagnosis not present

## 2019-09-18 DIAGNOSIS — I352 Nonrheumatic aortic (valve) stenosis with insufficiency: Secondary | ICD-10-CM | POA: Diagnosis present

## 2019-09-18 DIAGNOSIS — D539 Nutritional anemia, unspecified: Secondary | ICD-10-CM | POA: Diagnosis present

## 2019-09-18 DIAGNOSIS — E785 Hyperlipidemia, unspecified: Secondary | ICD-10-CM | POA: Diagnosis present

## 2019-09-18 DIAGNOSIS — M8580 Other specified disorders of bone density and structure, unspecified site: Secondary | ICD-10-CM | POA: Diagnosis present

## 2019-09-18 DIAGNOSIS — N390 Urinary tract infection, site not specified: Secondary | ICD-10-CM

## 2019-09-18 DIAGNOSIS — Z8619 Personal history of other infectious and parasitic diseases: Secondary | ICD-10-CM | POA: Diagnosis not present

## 2019-09-18 DIAGNOSIS — E860 Dehydration: Secondary | ICD-10-CM | POA: Diagnosis present

## 2019-09-18 DIAGNOSIS — Z7901 Long term (current) use of anticoagulants: Secondary | ICD-10-CM

## 2019-09-18 DIAGNOSIS — J811 Chronic pulmonary edema: Secondary | ICD-10-CM | POA: Diagnosis not present

## 2019-09-18 DIAGNOSIS — Z20822 Contact with and (suspected) exposure to covid-19: Secondary | ICD-10-CM | POA: Diagnosis present

## 2019-09-18 DIAGNOSIS — F0391 Unspecified dementia with behavioral disturbance: Secondary | ICD-10-CM | POA: Diagnosis present

## 2019-09-18 DIAGNOSIS — Z79890 Hormone replacement therapy (postmenopausal): Secondary | ICD-10-CM

## 2019-09-18 DIAGNOSIS — R6520 Severe sepsis without septic shock: Secondary | ICD-10-CM | POA: Diagnosis not present

## 2019-09-18 DIAGNOSIS — I130 Hypertensive heart and chronic kidney disease with heart failure and stage 1 through stage 4 chronic kidney disease, or unspecified chronic kidney disease: Secondary | ICD-10-CM | POA: Diagnosis present

## 2019-09-18 DIAGNOSIS — E039 Hypothyroidism, unspecified: Secondary | ICD-10-CM | POA: Diagnosis present

## 2019-09-18 DIAGNOSIS — Z87440 Personal history of urinary (tract) infections: Secondary | ICD-10-CM

## 2019-09-18 DIAGNOSIS — A419 Sepsis, unspecified organism: Secondary | ICD-10-CM

## 2019-09-18 DIAGNOSIS — Z79891 Long term (current) use of opiate analgesic: Secondary | ICD-10-CM

## 2019-09-18 DIAGNOSIS — I639 Cerebral infarction, unspecified: Secondary | ICD-10-CM | POA: Diagnosis not present

## 2019-09-18 DIAGNOSIS — C50919 Malignant neoplasm of unspecified site of unspecified female breast: Secondary | ICD-10-CM

## 2019-09-18 DIAGNOSIS — I69398 Other sequelae of cerebral infarction: Secondary | ICD-10-CM

## 2019-09-18 DIAGNOSIS — Z79811 Long term (current) use of aromatase inhibitors: Secondary | ICD-10-CM | POA: Diagnosis not present

## 2019-09-18 DIAGNOSIS — Z90710 Acquired absence of both cervix and uterus: Secondary | ICD-10-CM

## 2019-09-18 DIAGNOSIS — I5032 Chronic diastolic (congestive) heart failure: Secondary | ICD-10-CM

## 2019-09-18 DIAGNOSIS — Z96649 Presence of unspecified artificial hip joint: Secondary | ICD-10-CM | POA: Diagnosis present

## 2019-09-18 DIAGNOSIS — F03918 Unspecified dementia, unspecified severity, with other behavioral disturbance: Secondary | ICD-10-CM

## 2019-09-18 DIAGNOSIS — Z1612 Extended spectrum beta lactamase (ESBL) resistance: Secondary | ICD-10-CM | POA: Diagnosis present

## 2019-09-18 DIAGNOSIS — Z882 Allergy status to sulfonamides status: Secondary | ICD-10-CM

## 2019-09-18 DIAGNOSIS — Z993 Dependence on wheelchair: Secondary | ICD-10-CM

## 2019-09-18 DIAGNOSIS — N319 Neuromuscular dysfunction of bladder, unspecified: Secondary | ICD-10-CM | POA: Diagnosis present

## 2019-09-18 DIAGNOSIS — I4891 Unspecified atrial fibrillation: Secondary | ICD-10-CM

## 2019-09-18 DIAGNOSIS — Z888 Allergy status to other drugs, medicaments and biological substances status: Secondary | ICD-10-CM | POA: Diagnosis not present

## 2019-09-18 DIAGNOSIS — H5462 Unqualified visual loss, left eye, normal vision right eye: Secondary | ICD-10-CM | POA: Diagnosis present

## 2019-09-18 DIAGNOSIS — E876 Hypokalemia: Secondary | ICD-10-CM | POA: Diagnosis not present

## 2019-09-18 DIAGNOSIS — Z7982 Long term (current) use of aspirin: Secondary | ICD-10-CM

## 2019-09-18 DIAGNOSIS — A4151 Sepsis due to Escherichia coli [E. coli]: Secondary | ICD-10-CM | POA: Diagnosis present

## 2019-09-18 DIAGNOSIS — Z9181 History of falling: Secondary | ICD-10-CM

## 2019-09-18 DIAGNOSIS — Z79899 Other long term (current) drug therapy: Secondary | ICD-10-CM

## 2019-09-18 LAB — COMPREHENSIVE METABOLIC PANEL
ALT: 16 U/L (ref 0–44)
AST: 19 U/L (ref 15–41)
Albumin: 3.9 g/dL (ref 3.5–5.0)
Alkaline Phosphatase: 77 U/L (ref 38–126)
Anion gap: 12 (ref 5–15)
BUN: 31 mg/dL — ABNORMAL HIGH (ref 8–23)
CO2: 19 mmol/L — ABNORMAL LOW (ref 22–32)
Calcium: 8.7 mg/dL — ABNORMAL LOW (ref 8.9–10.3)
Chloride: 110 mmol/L (ref 98–111)
Creatinine, Ser: 1.35 mg/dL — ABNORMAL HIGH (ref 0.44–1.00)
GFR calc Af Amer: 41 mL/min — ABNORMAL LOW (ref >60)
GFR calc non Af Amer: 36 mL/min — ABNORMAL LOW (ref >60)
Glucose, Bld: 143 mg/dL — ABNORMAL HIGH (ref 70–99)
Potassium: 3.9 mmol/L (ref 3.5–5.1)
Sodium: 141 mmol/L (ref 135–145)
Total Bilirubin: 1.3 mg/dL — ABNORMAL HIGH (ref 0.3–1.2)
Total Protein: 6.9 g/dL (ref 6.5–8.1)

## 2019-09-18 LAB — CBC WITH DIFFERENTIAL/PLATELET
Abs Immature Granulocytes: 0.08 10*3/uL — ABNORMAL HIGH (ref 0.00–0.07)
Basophils Absolute: 0.1 10*3/uL (ref 0.0–0.1)
Basophils Relative: 0 %
Eosinophils Absolute: 0 10*3/uL (ref 0.0–0.5)
Eosinophils Relative: 0 %
HCT: 37.7 % (ref 36.0–46.0)
Hemoglobin: 11.8 g/dL — ABNORMAL LOW (ref 12.0–15.0)
Immature Granulocytes: 0 %
Lymphocytes Relative: 3 %
Lymphs Abs: 0.5 10*3/uL — ABNORMAL LOW (ref 0.7–4.0)
MCH: 31.1 pg (ref 26.0–34.0)
MCHC: 31.3 g/dL (ref 30.0–36.0)
MCV: 99.5 fL (ref 80.0–100.0)
Monocytes Absolute: 1.5 10*3/uL — ABNORMAL HIGH (ref 0.1–1.0)
Monocytes Relative: 8 %
Neutro Abs: 16.6 10*3/uL — ABNORMAL HIGH (ref 1.7–7.7)
Neutrophils Relative %: 89 %
Platelets: 217 10*3/uL (ref 150–400)
RBC: 3.79 MIL/uL — ABNORMAL LOW (ref 3.87–5.11)
RDW: 13.2 % (ref 11.5–15.5)
WBC: 18.7 10*3/uL — ABNORMAL HIGH (ref 4.0–10.5)
nRBC: 0 % (ref 0.0–0.2)

## 2019-09-18 LAB — RESPIRATORY PANEL BY RT PCR (FLU A&B, COVID)
Influenza A by PCR: NEGATIVE
Influenza B by PCR: NEGATIVE
SARS Coronavirus 2 by RT PCR: NEGATIVE

## 2019-09-18 LAB — URINALYSIS, ROUTINE W REFLEX MICROSCOPIC
Bilirubin Urine: NEGATIVE
Glucose, UA: NEGATIVE mg/dL
Ketones, ur: NEGATIVE mg/dL
Nitrite: POSITIVE — AB
Protein, ur: 100 mg/dL — AB
RBC / HPF: >50 RBC/hpf — ABNORMAL HIGH (ref 0–5)
Specific Gravity, Urine: 1.013 (ref 1.005–1.030)
WBC, UA: >50 WBC/hpf — ABNORMAL HIGH (ref 0–5)
pH: 6 (ref 5.0–8.0)

## 2019-09-18 LAB — LACTIC ACID, PLASMA: Lactic Acid, Venous: 1.4 mmol/L (ref 0.5–1.9)

## 2019-09-18 LAB — PROTIME-INR
INR: 1.1 (ref 0.8–1.2)
Prothrombin Time: 14.4 seconds (ref 11.4–15.2)

## 2019-09-18 LAB — APTT: aPTT: 32 seconds (ref 24–36)

## 2019-09-18 LAB — POC SARS CORONAVIRUS 2 AG -  ED: SARS Coronavirus 2 Ag: NEGATIVE

## 2019-09-18 MED ORDER — SODIUM CHLORIDE 0.9 % IV SOLN
1.0000 g | Freq: Two times a day (BID) | INTRAVENOUS | Status: DC
  Administered 2019-09-19 – 2019-09-21 (×7): 1 g via INTRAVENOUS
  Filled 2019-09-18 (×9): qty 1

## 2019-09-18 MED ORDER — LETROZOLE 2.5 MG PO TABS
2.5000 mg | ORAL_TABLET | Freq: Every day | ORAL | Status: DC
  Administered 2019-09-19 – 2019-09-25 (×7): 2.5 mg via ORAL
  Filled 2019-09-18 (×7): qty 1

## 2019-09-18 MED ORDER — BRIMONIDINE TARTRATE-TIMOLOL 0.2-0.5 % OP SOLN: 1.0000 [drp] | Freq: Two times a day (BID) | OPHTHALMIC | Status: DC

## 2019-09-18 MED ORDER — FAMOTIDINE 20 MG PO TABS: 20.0000 mg | ORAL_TABLET | Freq: Every day | ORAL | Status: DC

## 2019-09-18 MED ORDER — ONDANSETRON 4 MG PO TBDP
4.0000 mg | ORAL_TABLET | Freq: Three times a day (TID) | ORAL | Status: DC | PRN
  Administered 2019-09-18: 22:00:00 4 mg via ORAL
  Filled 2019-09-18: qty 1

## 2019-09-18 MED ORDER — ENOXAPARIN SODIUM 40 MG/0.4ML ~~LOC~~ SOLN
40.0000 mg | SUBCUTANEOUS | Status: DC
  Administered 2019-09-18 – 2019-09-21 (×4): 40 mg via SUBCUTANEOUS
  Filled 2019-09-18 (×4): qty 0.4

## 2019-09-18 MED ORDER — LEVOTHYROXINE SODIUM 100 MCG PO TABS
100.0000 ug | ORAL_TABLET | Freq: Every day | ORAL | Status: DC
  Administered 2019-09-19 – 2019-09-25 (×7): 100 ug via ORAL
  Filled 2019-09-18 (×7): qty 1

## 2019-09-18 MED ORDER — LACTATED RINGERS IV BOLUS (SEPSIS)
250.0000 mL | Freq: Once | INTRAVENOUS | Status: AC
  Administered 2019-09-18: 13:00:00 250 mL via INTRAVENOUS

## 2019-09-18 MED ORDER — PSYLLIUM 95 % PO PACK
1.0000 | PACK | Freq: Every day | ORAL | Status: DC
  Administered 2019-09-18 – 2019-09-24 (×7): 1 via ORAL
  Filled 2019-09-18 (×8): qty 1

## 2019-09-18 MED ORDER — SODIUM CHLORIDE 0.9 % IV BOLUS
250.0000 mL | Freq: Once | INTRAVENOUS | Status: AC
  Administered 2019-09-18: 250 mL via INTRAVENOUS

## 2019-09-18 MED ORDER — PREGABALIN 25 MG PO CAPS
25.0000 mg | ORAL_CAPSULE | Freq: Every day | ORAL | Status: DC
  Administered 2019-09-18 – 2019-09-24 (×7): 25 mg via ORAL
  Filled 2019-09-18 (×7): qty 1

## 2019-09-18 MED ORDER — TIMOLOL MALEATE 0.5 % OP SOLN
1.0000 [drp] | Freq: Two times a day (BID) | OPHTHALMIC | Status: DC
  Administered 2019-09-18 – 2019-09-25 (×14): 1 [drp] via OPHTHALMIC
  Filled 2019-09-18: qty 5

## 2019-09-18 MED ORDER — LACTATED RINGERS IV BOLUS (SEPSIS)
1000.0000 mL | Freq: Once | INTRAVENOUS | Status: AC
  Administered 2019-09-18: 12:00:00 1000 mL via INTRAVENOUS

## 2019-09-18 MED ORDER — SODIUM CHLORIDE 0.9 % IV SOLN: 2.0000 g | Freq: Once | INTRAVENOUS | Status: DC

## 2019-09-18 MED ORDER — ACETAMINOPHEN 650 MG RE SUPP
650.0000 mg | Freq: Four times a day (QID) | RECTAL | Status: DC | PRN
  Administered 2019-09-18: 650 mg via RECTAL
  Filled 2019-09-18 (×2): qty 1

## 2019-09-18 MED ORDER — ACETAMINOPHEN 650 MG RE SUPP
650.0000 mg | Freq: Once | RECTAL | Status: AC
  Administered 2019-09-18: 12:00:00 650 mg via RECTAL
  Filled 2019-09-18: qty 1

## 2019-09-18 MED ORDER — LATANOPROST 0.005 % OP SOLN
1.0000 [drp] | Freq: Every day | OPHTHALMIC | Status: DC
  Administered 2019-09-18 – 2019-09-24 (×7): 1 [drp] via OPHTHALMIC
  Filled 2019-09-18: qty 2.5

## 2019-09-18 MED ORDER — RIVAROXABAN 20 MG PO TABS: 20.0000 mg | ORAL_TABLET | Freq: Every day | ORAL | Status: DC

## 2019-09-18 MED ORDER — PANTOPRAZOLE SODIUM 40 MG PO TBEC: 40.0000 mg | DELAYED_RELEASE_TABLET | Freq: Every day | ORAL | Status: DC

## 2019-09-18 MED ORDER — SODIUM CHLORIDE 0.9 % IV SOLN
1.0000 g | Freq: Once | INTRAVENOUS | Status: AC
  Administered 2019-09-18: 12:00:00 1 g via INTRAVENOUS
  Filled 2019-09-18: qty 1

## 2019-09-18 MED ORDER — LACTATED RINGERS IV BOLUS (SEPSIS)
1000.0000 mL | Freq: Once | INTRAVENOUS | Status: AC
  Administered 2019-09-18: 1000 mL via INTRAVENOUS

## 2019-09-18 MED ORDER — PANTOPRAZOLE SODIUM 40 MG PO TBEC
40.0000 mg | DELAYED_RELEASE_TABLET | Freq: Every day | ORAL | Status: DC
  Administered 2019-09-19 – 2019-09-25 (×7): 40 mg via ORAL
  Filled 2019-09-18 (×7): qty 1

## 2019-09-18 MED ORDER — BRINZOLAMIDE 1 % OP SUSP
1.0000 [drp] | Freq: Two times a day (BID) | OPHTHALMIC | Status: DC
  Administered 2019-09-18 – 2019-09-25 (×14): 1 [drp] via OPHTHALMIC
  Filled 2019-09-18: qty 10

## 2019-09-18 MED ORDER — DULOXETINE HCL 20 MG PO CPEP
20.0000 mg | ORAL_CAPSULE | Freq: Every day | ORAL | Status: DC
  Administered 2019-09-18 – 2019-09-24 (×7): 20 mg via ORAL
  Filled 2019-09-18 (×8): qty 1

## 2019-09-18 MED ORDER — ENOXAPARIN SODIUM 30 MG/0.3ML ~~LOC~~ SOLN: 30.0000 mg | SUBCUTANEOUS | Status: DC

## 2019-09-18 MED ORDER — ACETAMINOPHEN 325 MG PO TABS
650.0000 mg | ORAL_TABLET | Freq: Four times a day (QID) | ORAL | Status: DC | PRN
  Administered 2019-09-18 – 2019-09-20 (×3): 650 mg via ORAL
  Filled 2019-09-18 (×3): qty 2

## 2019-09-18 MED ORDER — DOCUSATE SODIUM 100 MG PO CAPS
100.0000 mg | ORAL_CAPSULE | Freq: Every day | ORAL | Status: DC
  Administered 2019-09-19 – 2019-09-25 (×7): 100 mg via ORAL
  Filled 2019-09-18 (×7): qty 1

## 2019-09-18 MED ORDER — SODIUM CHLORIDE 0.9 % IV SOLN: INTRAVENOUS | Status: DC

## 2019-09-18 MED ORDER — BRIMONIDINE TARTRATE 0.2 % OP SOLN
1.0000 [drp] | Freq: Two times a day (BID) | OPHTHALMIC | Status: DC
  Administered 2019-09-18 – 2019-09-25 (×14): 1 [drp] via OPHTHALMIC
  Filled 2019-09-18: qty 5

## 2019-09-18 NOTE — Progress Notes (Signed)
   Vital Signs MEWS/VS Documentation      09/18/2019 1400 09/18/2019 1430 09/18/2019 1457 09/18/2019 1515   MEWS Score:  7  6  5  5    MEWS Score Color:  Red  Red  Red  Red   Resp:  (!) 23  18  (!) 23  --   Pulse:  (!) 102  --  97  --   BP:  (!) 158/54  (!) 150/57  130/68  106/85   Temp:  --  --  (!) 102.9 F (39.4 C)  (!) 103 F (39.4 C)   O2 Device:  --  --  Room Air  Nasal Cannula   O2 Flow Rate (L/min):  --  --  --  2 L/min   Level of Consciousness:  --  --  --  Responds to Pain      Patient with mews score of 5.  MD and rapid nurse contacted.  No new orders obtained.     Victoria Walsh  Victoria Walsh 09/18/2019,3:33 PM

## 2019-09-18 NOTE — ED Provider Notes (Signed)
Victoria Walsh Provider Note   CSN: [id number] Arrival date & time: 09/18/19  1057     History Chief Complaint  Patient presents with  . Altered Mental Status  . Weakness    Victoria Walsh is a 86 y.o. female.  The history is provided by the EMS personnel and the nursing home. The history is limited by the absence of a caregiver and the condition of the patient.  Altered Mental Status Presenting symptoms: confusion   Severity:  Moderate Most recent episode:  Today Episode history:  Continuous Timing:  Constant Progression:  Unchanged Chronicity:  New Context: dementia and nursing home resident   Associated symptoms: agitation, fever and weakness   Associated symptoms comment:  Urinary catheter is not draining Weakness Associated symptoms: fever        Past Medical History:  Diagnosis Date  . Chronic kidney disease   . CVA (cerebral infarction)   . Dementia ([place])   . Hyperlipidemia   . Osteopenia   . UTI (lower urinary tract infection)     There are no problems to display for this patient.   Past Surgical History:  Procedure Laterality Date  . ABDOMINAL HYSTERECTOMY    . BREAST LUMPECTOMY    . REPLACEMENT TOTAL KNEE    . TOTAL HIP ARTHROPLASTY       OB History   No obstetric history on file.     No family history on file.  Social History   Tobacco Use  . Smoking status: Never Smoker  . Smokeless tobacco: Never Used  Substance Use Topics  . Alcohol use: Yes    Comment: socially: Once every month  . Drug use: No    Home Medications Prior to Admission medications   Medication Sig Start Date End Date Taking? Authorizing Provider  brimonidine-timolol (COMBIGAN) 0.2-0.5 % ophthalmic solution Place 1 drop into the left eye every 12 (twelve) hours.    [provider]  brinzolamide (AZOPT) 1 % ophthalmic suspension Place 1 drop into the left eye 2 (two) times daily.    [provider]  cephALEXin  (KEFLEX) 500 MG capsule Take 1 capsule (500 mg total) by mouth 4 (four) times daily. 04/26/19   Drenda Freeze, MD  Cranberry 450 MG CAPS Take 450 mg by mouth 2 (two) times daily.    [provider]  docusate sodium (COLACE) 100 MG capsule Take 100 mg by mouth daily.    [provider]  DULoxetine (CYMBALTA) 20 MG capsule Take 20 mg by mouth at bedtime.     [provider]  latanoprost (XALATAN) 0.005 % ophthalmic solution Place 1 drop into the left eye at bedtime.    [provider]  letrozole (FEMARA) 2.5 MG tablet Take 2.5 mg by mouth daily.    [provider]  levothyroxine (SYNTHROID, LEVOTHROID) 100 MCG tablet Take 100 mcg by mouth daily before breakfast.     [provider]  Multiple Vitamins-Iron (MULTIVITAMINS WITH IRON) TABS tablet Take 1 tablet by mouth at bedtime.    [provider]  Multiple Vitamins-Minerals (ICAPS AREDS 2) CAPS Take 1 capsule by mouth daily.    [provider]  pregabalin (LYRICA) 25 MG capsule Take 25 mg by mouth at bedtime.    [provider]  Psyllium 500 MG CAPS Take 500 mg by mouth at bedtime.     [provider]  ranitidine (ZANTAC) 150 MG tablet Take 150 mg by mouth daily.    [provider]  rivaroxaban (XARELTO) 20 MG TABS tablet Take 20 mg by mouth daily with supper.    [provider]  traMADol (ULTRAM) 50 MG tablet Take 1 tablet (50 mg total) by mouth 2 (two) times daily. 04/26/19   Drenda Freeze, MD    Allergies    Ciprofloxacin, Oxaprozin, and Sulfa antibiotics  Review of Systems   Review of Systems  Unable to perform ROS: Dementia  Constitutional: Positive for fever.  Neurological: Positive for weakness.  Psychiatric/Behavioral: Positive for agitation and confusion.    Physical Exam Updated Vital Signs BP (!) 162/96   Pulse (!) 120   Temp (!) 102.6 F (39.2 C) (Rectal)   Resp 18   SpO2 91%   Physical Exam Vitals and  nursing note reviewed.  Constitutional:      General: She is not in acute distress.    Appearance: She is well-developed and normal weight.  HENT:     Head: Normocephalic and atraumatic.     Mouth/Throat:     Mouth: Mucous membranes are dry.  Eyes:     Conjunctiva/sclera: Conjunctivae normal.     Pupils: Pupils are equal, round, and reactive to light.     Comments: Right pupil is reactive and EOMI.  Pt has non-reactive left pupil and mild opacification of the left eye  Cardiovascular:     Rate and Rhythm: Regular rhythm. Tachycardia present.     Heart sounds: No murmur.  Pulmonary:     Effort: Pulmonary effort is normal. No respiratory distress.     Breath sounds: Normal breath sounds. No wheezing or rales.  Abdominal:     General: There is no distension.     Palpations: Abdomen is soft.     Tenderness: There is no abdominal tenderness. There is no guarding or rebound.  Musculoskeletal:        General: No tenderness. Normal range of motion.     Cervical back: Normal range of motion and neck supple.  Skin:    General: Skin is warm and dry.     Findings: No erythema or rash.     Comments: Hot to touch  Neurological:     Comments: Will open eyes to voice and follow some commands.  Moving right arm and leg with ease.  5/5 strenght.  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     Comments: Calm but intermittently combative.     ED Results / Procedures / Treatments   Labs (all labs ordered are listed, but only abnormal results are displayed) Labs Reviewed  COMPREHENSIVE METABOLIC PANEL - Abnormal; Notable for the following components:      Result Value   CO2 19 (*)    Glucose, Bld 143 (*)    BUN 31 (*)    Creatinine, Ser 1.35 (*)    Calcium 8.7 (*)    Total Bilirubin 1.3 (*)    GFR calc non Af Amer 36 (*)    GFR calc Af Amer 41 (*)    All other components within normal limits  CBC WITH DIFFERENTIAL/PLATELET -  Abnormal; Notable for the following components:   WBC 18.7 (*)    RBC 3.79 (*)    Hemoglobin 11.8 (*)    Neutro Abs 16.6 (*)    Lymphs Abs 0.5 (*)    Monocytes Absolute 1.5 (*)    Abs Immature Granulocytes 0.08 (*)    All other  components within normal limits  URINALYSIS, ROUTINE W REFLEX MICROSCOPIC - Abnormal; Notable for the following components:   APPearance TURBID (*)    Hgb urine dipstick SMALL (*)    Protein, ur 100 (*)    Nitrite POSITIVE (*)    Leukocytes,Ua LARGE (*)    RBC / HPF >50 (*)    WBC, UA >50 (*)    Bacteria, UA MANY (*)    All other components within normal limits  CULTURE, BLOOD (ROUTINE X 2)  CULTURE, BLOOD (ROUTINE X 2)  URINE CULTURE  RESPIRATORY PANEL BY RT PCR (FLU A&B, COVID)  LACTIC ACID, PLASMA  APTT  PROTIME-INR  POC SARS CORONAVIRUS 2 AG -  ED    EKG EKG Interpretation  Date/Time:  Tuesday September 18 2019 11:17:26 EST Ventricular Rate:  116 PR Interval:    QRS Duration: 92 QT Interval:  311 QTC Calculation: 432 R Axis:     Text Interpretation: Sinus tachycardia Abnormal R-wave progression, early transition Inferior infarct, old No significant change since last tracing Confirmed by Blanchie Dessert [phone]) on 09/18/2019 1:15:50 PM   Radiology DG Chest Port 1 View  Result Date: 09/18/2019 CLINICAL DATA:  Fever.  Altered mental status. EXAM: PORTABLE CHEST 1 VIEW COMPARISON:  Chest x-ray 04/26/2019. FINDINGS: Mediastinum heart size appears stable. Low lung volumes. Mild bibasilar atelectasis. Mild left base infiltrate cannot be excluded. No pleural effusion or pneumothorax. Surgical clips right chest. IMPRESSION: Low lung volumes with mild bibasilar atelectasis. Mild left base infiltrate cannot be excluded. Electronically Signed   By: Marcello Moores  Register   On: 09/18/2019 12:11    Procedures Procedures (including critical care time)  Medications Ordered in ED Medications  lactated ringers bolus 1,000 mL (has no administration in time range)     And  lactated ringers bolus 1,000 mL (has no administration in time range)    And  lactated ringers bolus 250 mL (has no administration in time range)  acetaminophen (TYLENOL) suppository 650 mg (has no administration in time range)  meropenem (MERREM) 1 g in sodium chloride 0.9 % 100 mL IVPB (has no administration in time range)    ED Course  I have reviewed the triage vital signs and the nursing notes.  Pertinent labs & imaging results that were available during my care of the patient were reviewed by me and considered in my medical decision making (see chart for details).    MDM Rules/Calculators/A&P                      Elderly female who lives in a facility due to dementia and prior stroke with left-sided paralysis who has a chronic indwelling Foley catheter presenting today with altered mental status and fever.  Also facility reports that catheter stopped draining.  Unclear when catheter was last changed the patient is febrile here to 102.6 and tachycardic.  No report of cough or shortness of breath.  Patient will open her eyes and speak occasionally but unable to give any history.  Blood pressure remained stable.  Patient's catheter was changed.  Sepsis order set initiated with 30/kg of fluid.  Patient was given rectal Tylenol.  Patient was covered with meropenem as he has been ESBL positive in both 2020 and 2019.  Also Covid pending.  1:54 PM Patient's UA is concerning for UTI, lactic acid within normal limits, CBC with a leukocytosis of 18,000 but stable hemoglobin and CMP today with mild AKI with creatinine 1.35.  Patient's chest x-ray shows  low lung volumes with some mild atelectasis but they cannot exclude infiltrate in the left base.  Patient's oxygen saturation has remained stable.  Tachycardia has improved after 30/kg of fluid.  Patient was given meropenem which would also cover respiratory pathology if needed.  Rapid Covid was negative and PCR was ordered.  Patient remained  hemodynamically stable.  Will admit for further care.  CRITICAL CARE Performed by: Garry Nicolini Total critical care time: 30 minutes Critical care time was exclusive of separately billable procedures and treating other patients. Critical care was necessary to treat or prevent imminent or life-threatening deterioration. Critical care was time spent personally by me on the following activities: development of treatment plan with patient and/or surrogate as well as nursing, discussions with consultants, evaluation of patient's response to treatment, examination of patient, obtaining history from patient or surrogate, ordering and performing treatments and interventions, ordering and review of laboratory studies, ordering and review of radiographic studies, pulse oximetry and re-evaluation of patient's condition.  Final Clinical Impression(s) / ED Diagnoses Final diagnoses:  Lower urinary tract infectious disease  Sepsis with acute renal failure without septic shock, due to unspecified organism, unspecified acute renal failure type [facility])    Rx / DC Orders ED Discharge Orders    None       Blanchie Dessert, MD 09/18/19 2107

## 2019-09-18 NOTE — ED Triage Notes (Signed)
Per EMS: Patient is coming from [facility].  Patient reportedly was more confused than normal, had a fever, and that her catheter bag is no longer draining. At baseline patient is only oriented to self and is bed bound at baseline with flaccidity on the left side and a non-reactive pupil from prior stroke. No reported new neurologic complaints.

## 2019-09-18 NOTE — Progress Notes (Signed)
A consult was received from an ED physician for Cefepime per pharmacy dosing.  The patient's profile has been reviewed for ht/wt/allergies/indication/available labs.   Patient has hx of ESBL UTI.  Discussed with Dr Maryan Rued & will change to Meropenem for empiric therapy.  A one time order has been placed for Meropenem 1gm IV.  Further antibiotics/pharmacy consults should be ordered by admitting physician if indicated.                       Thank you, Netta Cedars, PharmD, BCPS 09/18/2019  11:37 AM

## 2019-09-18 NOTE — Progress Notes (Signed)
   Vital Signs MEWS/VS Documentation      09/18/2019 1515 09/18/2019 1530 09/18/2019 1545 09/18/2019 1600   MEWS Score:  5  6  4  4    MEWS Score Color:  Red  Red  Red  Red   Resp:  --  (!) 24  (!) 22  (!) 22   Pulse:  --  (!) 108  89  89   BP:  106/85  (!) 153/103  (!) 140/57  (!) 157/80   Temp:  (!) 103 F (39.4 C)  (!) 103.1 F (39.5 C)  (!) 103.1 F (39.5 C)  (!) 103 F (39.4 C)   O2 Device:  Nasal Cannula  Nasal Cannula  Nasal Cannula  Nasal Cannula   O2 Flow Rate (L/min):  2 L/min  2 L/min  2 L/min  2 L/min   Level of Consciousness:  Responds to Pain  Responds to Pain  Responds to Voice  Responds to Voice      Patient continues to have red mews score.  Rapid response nurse at bedside.       Asya Derryberry 09/18/2019,4:18 PM

## 2019-09-18 NOTE — Progress Notes (Signed)
Patient arrives via stretcher from ED at this time.  Patient unable to answer any questions regarding orientation at this time.  Patient febrile at 102.9 - request sent to MD for tylenol

## 2019-09-18 NOTE — ED Notes (Signed)
Ronalee Belts RN attempted report at 20. RN will call back.

## 2019-09-18 NOTE — Progress Notes (Signed)
   Vital Signs MEWS/VS Documentation      09/18/2019 1515 09/18/2019 1530 09/18/2019 1545 09/18/2019 1600   MEWS Score:  5  6  4  4    MEWS Score Color:  Red  Red  Red  Red   Resp:  --  (!) 24  (!) 22  (!) 22   Pulse:  --  (!) 108  89  89   BP:  106/85  (!) 153/103  (!) 140/57  (!) 157/80   Temp:  (!) 103 F (39.4 C)  (!) 103.1 F (39.5 C)  (!) 103.1 F (39.5 C)  (!) 103 F (39.4 C)   O2 Device:  Nasal Cannula  Nasal Cannula  Nasal Cannula  Nasal Cannula   O2 Flow Rate (L/min):  2 L/min  2 L/min  2 L/min  2 L/min   Level of Consciousness:  Responds to Pain  Responds to Pain  Responds to Voice  Responds to Voice      Patient continues to have red mews score.  Po tylenol given for fever.     Tanna Loeffler 09/18/2019,4:18 PM

## 2019-09-18 NOTE — H&P (Addendum)
History and Physical    Airel Schirripa [id number] DOB: 06-11-1932 DOA: 09/18/2019  PCP: Reymundo Poll, MD   [facility]    Chief Complaint: Fever, altered mental status  HPI: Victoria Walsh is a 86 y.o. female with medical history significant of ischemic stroke with residual left eye blindness, left sided hemiparesis, wheelchair-bound, advanced dementia, breast cancer, hypothyroidism who was sent from [place] facility with complaints of fever and altered mental status.  Due to patient mental status, all information was taken from the son on phone.  Patient has neurogenic bladder from stroke so she has a chronic indwelling Foley catheter.  Facility reported that the catheter stopped draining.  When she presented here she had fever of 102 F, tachycardic.  Foley catheter was very dirty and it was not draining urine.  Her blood pressure was stable on presentation.  She has history of ESBL E. coli UTI. Patient seen and examined at the bedside in the emergency department.  Febrile during my presentation but hemodynamically stable.  There was no report of nausea, vomiting, diarrhea, abdominal pain, chest pain or shortness of breath or cough.  She was unable to provide any history.  She was not in any kind of distress.  ED Course: Sepsis suspected.  Initiated IV fluids, started on meropenem. Urine looked turbid. Foley catheter changed.  UA suggestive of urinary tract infection.  She had elevated leukocytes of 18,000.  Chest x-ray showed possible left sided lung infiltrate but she did not have any respiratory symptoms.  Review of Systems: As per HPI otherwise 10 point review of systems negative.    Past Medical History:  Diagnosis Date  . Chronic kidney disease   . CVA (cerebral infarction)   . Dementia ([place])   . Hyperlipidemia   . Osteopenia   . UTI (lower urinary tract infection)     Past Surgical History:  Procedure Laterality  Date  . ABDOMINAL HYSTERECTOMY    . BREAST LUMPECTOMY    . REPLACEMENT TOTAL KNEE    . TOTAL HIP ARTHROPLASTY       reports that she has never smoked. She has never used smokeless tobacco. She reports current alcohol use. She reports that she does not use drugs.  Allergies  Allergen Reactions  . Ciprofloxacin Other (See Comments)    On MAR  . Oxaprozin Other (See Comments)    DAYPRO (SULFA DRUG) On MAR  . Sulfa Antibiotics Other (See Comments)    On MAR    No family history on file.   Prior to Admission medications   Medication Sig Start Date End Date Taking? Authorizing Provider  brimonidine-timolol (COMBIGAN) 0.2-0.5 % ophthalmic solution Place 1 drop into the left eye every 12 (twelve) hours.    [provider]  brinzolamide (AZOPT) 1 % ophthalmic suspension Place 1 drop into the left eye 2 (two) times daily.    [provider]  cephALEXin (KEFLEX) 500 MG capsule Take 1 capsule (500 mg total) by mouth 4 (four) times daily. 04/26/19   Drenda Freeze, MD  Cranberry 450 MG CAPS Take 450 mg by mouth 2 (two) times daily.    [provider]  docusate sodium (COLACE) 100 MG capsule Take 100 mg by mouth daily.    [provider]  DULoxetine (CYMBALTA) 20 MG capsule Take 20 mg by mouth at bedtime.     [provider]  latanoprost (XALATAN) 0.005 % ophthalmic solution Place 1 drop into the left  eye at bedtime.    [provider]  letrozole (FEMARA) 2.5 MG tablet Take 2.5 mg by mouth daily.    [provider]  levothyroxine (SYNTHROID, LEVOTHROID) 100 MCG tablet Take 100 mcg by mouth daily before breakfast.     [provider]  Multiple Vitamins-Iron (MULTIVITAMINS WITH IRON) TABS tablet Take 1 tablet by mouth at bedtime.    [provider]  Multiple Vitamins-Minerals (ICAPS AREDS 2) CAPS Take 1 capsule by mouth daily.    [provider]  pregabalin (LYRICA) 25 MG capsule Take 25 mg by mouth at  bedtime.    [provider]  Psyllium 500 MG CAPS Take 500 mg by mouth at bedtime.     [provider]  ranitidine (ZANTAC) 150 MG tablet Take 150 mg by mouth daily.    [provider]  rivaroxaban (XARELTO) 20 MG TABS tablet Take 20 mg by mouth daily with supper.    [provider]  traMADol (ULTRAM) 50 MG tablet Take 1 tablet (50 mg total) by mouth 2 (two) times daily. 04/26/19   Drenda Freeze, MD    Physical Exam: Vitals:   09/18/19 1119 09/18/19 1130 09/18/19 1200 09/18/19 1318  BP: (!) 162/96 (!) 123/107 (!) 123/107 (!) 143/70  Pulse: (!) 120  (!) 112 (!) 108  Resp: 18 (!) 24 (!) 24 (!) 24  Temp: (!) 102.6 F (39.2 C)     TempSrc: Rectal     SpO2: 91%  91% 92%  Height:    5\' 8"  (1.727 m)    Constitutional: Elderly, deconditioned female. Vitals:   09/18/19 1119 09/18/19 1130 09/18/19 1200 09/18/19 1318  BP: (!) 162/96 (!) 123/107 (!) 123/107 (!) 143/70  Pulse: (!) 120  (!) 112 (!) 108  Resp: 18 (!) 24 (!) 24 (!) 24  Temp: (!) 102.6 F (39.2 C)     TempSrc: Rectal     SpO2: 91%  91% 92%  Height:    5\' 8"  (1.727 m)   ENMT: Mucous membranes are moist.  Neck: normal, supple, no masses, no thyromegaly Respiratory: clear to auscultation bilaterally, no wheezing, no crackles. Normal respiratory effort. No accessory muscle use.  Cardiovascular: Regular rate and rhythm, no murmurs / rubs / gallops. No extremity edema.  Abdomen: no tenderness, no masses palpated. No hepatosplenomegaly. Bowel sounds positive.  Musculoskeletal: no clubbing / cyanosis. No joint deformity upper and lower extremities.  Skin: no rashes, lesions, ulcers. No induration Neurologic: Examination not possible due to patient's mental status.  Psychiatric: Normal judgment and insight impaired. Alert and awake but not oriented  Foley Catheter: Foley change in the urine ,now draining clear urine Labs on Admission: I have personally reviewed following labs and imaging  studies  CBC: Recent Labs  Lab 09/18/19 1137  WBC 18.7*  NEUTROABS 16.6*  HGB 11.8*  HCT 37.7  MCV 99.5  PLT [id number]   Basic Metabolic Panel: Recent Labs  Lab 09/18/19 1137  NA 141  K 3.9  CL 110  CO2 19*  GLUCOSE 143*  BUN 31*  CREATININE 1.35*  CALCIUM 8.7*   GFR: CrCl cannot be calculated (Unknown ideal weight.). Liver Function Tests: Recent Labs  Lab 09/18/19 1137  AST 19  ALT 16  ALKPHOS 77  BILITOT 1.3*  PROT 6.9  ALBUMIN 3.9   No results for input(s): LIPASE, AMYLASE in the last 168 hours. No results for input(s): AMMONIA in the last 168 hours. Coagulation Profile: Recent Labs  Lab 09/18/19 1137  INR 1.1   Cardiac Enzymes: No results for input(s): CKTOTAL, CKMB, CKMBINDEX, TROPONINI in the last 168 hours. BNP (last 3 results) No results for input(s): PROBNP in the last 8760 hours. HbA1C: No results for input(s): HGBA1C in the last 72 hours. CBG: No results for input(s): GLUCAP in the last 168 hours. Lipid Profile: No results for input(s): CHOL, HDL, LDLCALC, TRIG, CHOLHDL, LDLDIRECT in the last 72 hours. Thyroid Function Tests: No results for input(s): TSH, T4TOTAL, FREET4, T3FREE, THYROIDAB in the last 72 hours. Anemia Panel: No results for input(s): VITAMINB12, FOLATE, FERRITIN, TIBC, IRON, RETICCTPCT in the last 72 hours. Urine analysis:    Component Value Date/Time   COLORURINE YELLOW 09/18/2019 1138   APPEARANCEUR TURBID (A) 09/18/2019 1138   LABSPEC 1.013 09/18/2019 1138   PHURINE 6.0 09/18/2019 1138   GLUCOSEU NEGATIVE 09/18/2019 1138   HGBUR SMALL (A) 09/18/2019 1138   BILIRUBINUR NEGATIVE 09/18/2019 1138   KETONESUR NEGATIVE 09/18/2019 1138   PROTEINUR 100 (A) 09/18/2019 1138   NITRITE POSITIVE (A) 09/18/2019 1138   LEUKOCYTESUR LARGE (A) 09/18/2019 1138    Radiological Exams on Admission: DG Chest Port 1 View  Result Date: 09/18/2019 CLINICAL DATA:  Fever.  Altered mental status. EXAM: PORTABLE CHEST 1 VIEW COMPARISON:  Chest  x-ray 04/26/2019. FINDINGS: Mediastinum heart size appears stable. Low lung volumes. Mild bibasilar atelectasis. Mild left base infiltrate cannot be excluded. No pleural effusion or pneumothorax. Surgical clips right chest. IMPRESSION: Low lung volumes with mild bibasilar atelectasis. Mild left base infiltrate cannot be excluded. Electronically Signed   By: [place]   On: 09/18/2019 12:11     Assessment/Plan Principal Problem:   Sepsis ([place]) Active Problems:   Acute lower UTI   AKI (acute kidney injury) ([place])   Dementia with behavioral disturbance ([place])   Ischemic stroke ([place])   Hypothyroidism   Breast cancer ([place])   Sepsis present on admission: Presented with fever, tachycardia, acute change in the mental status.  Sepsis likely source is urine.  Urine culture, blood culture sent.  Continue current antibiotics, IV fluids.  Acute lower UTI: Has chronic indwelling Foley catheter due to neurogenic bladder.  She has history of ESBL E. coli.  Started on meropenem.  Follow-up cultures. Urine was turbid and Foley was dirty on presentation.  Foley catheter changed  AKI: Her last creatinine was 1.04 on 9/20.  Started on IV fluids.  Check BMP tomorrow.  Dementia: Pretty advanced.  Confused at baseline.  Nursing facility resident.  Ambulates with the of wheelchair  History of nonhemorrhagic  stroke: Wheelchair-bound.  Has left-sided residual hemiparesis, left eye blindness.  Takes aspirin.  Continue supportive care.  Nursing facility resident.  Hypothyroidism: Continue Synthyroid  GERD: Continue PPI  History of left breast cancer: On letrozole.  Status post mastectomy.     Severity of Illness: The appropriate patient status for this patient is INPATIENT. Inpatient status is judged to be reasonable and necessary in order to provide the required intensity of service to ensure the patient's safety. The patient's presenting symptoms, physical exam findings, and initial radiographic and  laboratory data in the context of their chronic comorbidities is felt to place them at high risk for further clinical deterioration. Furthermore, it is not anticipated that the patient will be medically stable for discharge from the hospital within 2 midnights of admission. The following factors support the patient status of inpatient.   " The patient's presenting symptoms include fever, altered mental status. " The worrisome physical exam findings include  UTI. " The initial radiographic and laboratory data are worrisome because of sepsis " The chronic co-morbidities include dementia.   * I certify that at the point of admission it is my clinical judgment that the patient will require inpatient hospital care spanning beyond 2 midnights from the point of admission due to high intensity of service, high risk for further deterioration and high frequency of surveillance required.*    DVT prophylaxis: Lovenox Code Status: Full code Family Communication: Discussed with son on phone Consults called: None     Shelly Coss MD [facility]  09/18/2019, 2:06 PM

## 2019-09-18 NOTE — Progress Notes (Signed)
   Vital Signs MEWS/VS Documentation      09/18/2019 1530 09/18/2019 1545 09/18/2019 1600 09/18/2019 1626   MEWS Score:  6  4  5  5    MEWS Score Color:  Red  Red  Red  Red   Resp:  (!) 24  (!) 22  (!) 22  (!) 25   Pulse:  (!) 108  89  89  (!) 112   BP:  (!) 153/103  (!) 140/57  (!) 157/80  (!) 135/91   Temp:  (!) 103.1 F (39.5 C)  (!) 103.1 F (39.5 C)  (!) 103 F (39.4 C)  100.2 F (37.9 C)   O2 Device:  Nasal Cannula  Nasal Cannula  Nasal Cannula  Nasal Cannula   O2 Flow Rate (L/min):  2 L/min  2 L/min  2 L/min  2 L/min   Level of Consciousness:  Responds to Pain  Responds to Voice  Responds to Pain  --      Mews score remains red.  Temp has improved.  Will continue to monitor     Rance Muir 09/18/2019,4:58 PM

## 2019-09-18 NOTE — Progress Notes (Signed)
   Vital Signs MEWS/VS Documentation      09/18/2019 1545 09/18/2019 1600 09/18/2019 1626 09/18/2019 1700   MEWS Score:  4  5  5  3    MEWS Score Color:  Red  Red  Red  Yellow   Resp:  (!) 22  (!) 22  (!) 25  20   Pulse:  89  89  (!) 112  (!) 105   BP:  (!) 140/57  (!) 157/80  (!) 135/91  138/66   Temp:  (!) 103.1 F (39.5 C)  (!) 103 F (39.4 C)  100.2 F (37.9 C)  (!) 100.8 F (38.2 C)   O2 Device:  Nasal Cannula  Nasal Cannula  Nasal Cannula  Nasal Cannula   O2 Flow Rate (L/min):  2 L/min  2 L/min  2 L/min  2 L/min   Level of Consciousness:  Responds to Voice  Responds to Pain  -  Responds to Voice     Patient mews has gone from red to yellow.  Will continue to monitor      Rance Muir 09/18/2019,6:22 PM

## 2019-09-18 NOTE — Progress Notes (Signed)
Pharmacy Antibiotic Note  Victoria Walsh is a 86 y.o. female admitted on 09/18/2019 with UTI and patient with h/o ESBL UTI.  Pharmacy has been consulted for Meropenem dosing.  Plan: Meropenem 1gm IV q12h F/u culture results and sensitivities Follow renal function   Height: 5\' 8"  (172.7 cm) Weight: 176 lb 2.4 oz (79.9 kg) IBW/kg (Calculated) : 63.9  Temp (24hrs), Avg:102.9 F (39.4 C), Min:102.6 F (39.2 C), Max:103.1 F (39.5 C)  Recent Labs  Lab 09/18/19 1137  WBC 18.7*  CREATININE 1.35*  LATICACIDVEN 1.4    Estimated Creatinine Clearance: 33.8 mL/min (A) (by C-G formula based on SCr of 1.35 mg/dL (H)).    Allergies  Allergen Reactions  . Ciprofloxacin Other (See Comments)    On MAR  . Oxaprozin Other (See Comments)    DAYPRO (SULFA DRUG) On MAR  . Sulfa Antibiotics Other (See Comments)    On MAR    Antimicrobials this admission: 2/2 Meropenem >>     Dose adjustments this admission:    Microbiology results: 2/2 BCx: sent 2/2 UCx: sent  2/2 Resp Panel:    Thank you for allowing pharmacy to be a part of this patient's care.  Everette Rank, PharmD 09/18/2019 3:44 PM

## 2019-09-19 ENCOUNTER — Other Ambulatory Visit: Payer: Self-pay

## 2019-09-19 LAB — BLOOD CULTURE ID PANEL (REFLEXED)

## 2019-09-19 LAB — CBC
HCT: 30.5 % — ABNORMAL LOW (ref 36.0–46.0)
Hemoglobin: 9.5 g/dL — ABNORMAL LOW (ref 12.0–15.0)
MCH: 31.9 pg (ref 26.0–34.0)
MCHC: 31.1 g/dL (ref 30.0–36.0)
MCV: 102.3 fL — ABNORMAL HIGH (ref 80.0–100.0)
Platelets: 155 10*3/uL (ref 150–400)
RBC: 2.98 MIL/uL — ABNORMAL LOW (ref 3.87–5.11)
RDW: 13.5 % (ref 11.5–15.5)
WBC: 17.4 10*3/uL — ABNORMAL HIGH (ref 4.0–10.5)
nRBC: 0 % (ref 0.0–0.2)

## 2019-09-19 LAB — BASIC METABOLIC PANEL
Anion gap: 7 (ref 5–15)
BUN: 31 mg/dL — ABNORMAL HIGH (ref 8–23)
CO2: 22 mmol/L (ref 22–32)
Calcium: 7.9 mg/dL — ABNORMAL LOW (ref 8.9–10.3)
Chloride: 113 mmol/L — ABNORMAL HIGH (ref 98–111)
Creatinine, Ser: 1.3 mg/dL — ABNORMAL HIGH (ref 0.44–1.00)
GFR calc Af Amer: 43 mL/min — ABNORMAL LOW (ref >60)
GFR calc non Af Amer: 37 mL/min — ABNORMAL LOW (ref >60)
Glucose, Bld: 123 mg/dL — ABNORMAL HIGH (ref 70–99)
Potassium: 4 mmol/L (ref 3.5–5.1)
Sodium: 142 mmol/L (ref 135–145)

## 2019-09-19 MED ORDER — CHLORHEXIDINE GLUCONATE CLOTH 2 % EX PADS
6.0000 | MEDICATED_PAD | Freq: Every day | CUTANEOUS | Status: DC
  Administered 2019-09-19 – 2019-09-25 (×7): 6 via TOPICAL

## 2019-09-19 NOTE — Progress Notes (Signed)
PROGRESS NOTE  Victoria Walsh [id number] DOB: 09/04/1931 DOA: 09/18/2019 PCP: Reymundo Poll, MD  HPI/Recap of past 24 hours: Victoria Walsh is a 86 y.o. 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  Due to patient mental status, all information was taken from the son on phone.  Patient has neurogenic bladder from stroke so she has a chronic indwelling Foley catheter.  Facility reported that the catheter stopped draining.  When she presented here she had fever of 102 F, tachycardic.  Foley catheter was very dirty and it was not draining urine.  Her blood pressure was stable on presentation.  She has history of ESBL E. coli UTI. Patient seen and examined at the bedside in the emergency department.  Febrile during my presentation but hemodynamically stable.  There was no report of nausea, vomiting, diarrhea, abdominal pain, chest pain or shortness of breath or cough.  She was unable to provide any history.  She was not in any kind of distress.  09/19/19: Seen and examined.  She has no new complaints.  She is pleasantly confused in a state of dementia.  Positive UA and urine culture greater than 100,000 colonies of E. coli.  E. coli bacteremia.  She is on meropenem for history of ESBL E. Coli UTI      Assessment/Plan: Principal Problem:   Sepsis ([place]) Active Problems:   Acute lower UTI   AKI (acute kidney injury) ([place])   Dementia with behavioral disturbance ([place])   Ischemic stroke ([place])   Hypothyroidism   Breast cancer ([place])  Sepsis secondary to E. coli bacteremia likely urinary source from E. coli UTI History of ESBL E. coli UTI Continue meropenem Repeat blood cultures x2 peripherally on 09/20/2019. Monitor vital signs closely Monitor fever curve  and WBC  AKI likely prerenal in the setting of dehydration from poor oral intake Baseline creatinine appears to be 0.8 with GFR greater than 60 Presented with creatinine of 1.3 with GFR of 37. Avoid nephrotoxins and hypotension Monitor urine output Daily BMPs  Chronic macrocytic anemia Baseline hemoglobin appears to be 11 with MCV of 100. Drop in hemoglobin 9.5, possibly dilutional No sign of overt bleeding Monitor H&H  Dementia Reorient as needed Fall precautions  Hypothyroidism Continue Synthroid  GERD Continue PPI  History of left breast cancer  Continue letrozole   DVT prophylaxis: Lovenox Code Status: Full code Family Communication:  None at bedside. Consults called: None  Disposition: Patient is from SNF.  Anticipate discharge to SNF.  Barrier to discharge ongoing treatment for E. coli bacteremia.  Objective: Vitals:   09/18/19 2053 09/19/19 0003 09/19/19 0402 09/19/19 0832  BP:  (!) 109/47 (!) 115/55 121/61  Pulse: (!) 115 81 70 93  Resp:   18 20  Temp:  99.6 F (37.6 C) 98 F (36.7 C) 98.1 F (36.7 C)  TempSrc:  Oral Oral Oral  SpO2: 100% 100% 100% 100%  Weight:      Height:        Intake/Output Summary (Last 24 hours) at 09/19/2019 1504 Last data filed at 09/19/2019 0930 Gross per 24 hour  Intake 1674.45 ml  Output 700 ml  Net 974.45 ml   Filed Weights   09/18/19 1457  Weight: 79.9 kg    Exam:  . General: 86 y.o. year-old female well developed well nourished  in no acute distress.  Alert and pleasantly confused history of dementia. . Cardiovascular: Regular rate and rhythm with no rubs or gallops.  No thyromegaly or JVD noted.   Marland Kitchen Respiratory: Bibasilar mild rales with poor inspiratory effort. . Abdomen: Soft nontender nondistended with normal bowel sounds x4 quadrants. . Musculoskeletal: No lower extremity edema. 2/4 pulses in all 4 extremities. Marland Kitchen Psychiatry: Mood is appropriate for condition and setting   Data Reviewed: CBC: Recent  Labs  Lab 09/18/19 1137 09/19/19 0514  WBC 18.7* 17.4*  NEUTROABS 16.6*  --   HGB 11.8* 9.5*  HCT 37.7 30.5*  MCV 99.5 102.3*  PLT 217 [id number]   Basic Metabolic Panel: Recent Labs  Lab 09/18/19 1137 09/19/19 0514  NA 141 142  K 3.9 4.0  CL 110 113*  CO2 19* 22  GLUCOSE 143* 123*  BUN 31* 31*  CREATININE 1.35* 1.30*  CALCIUM 8.7* 7.9*   GFR: Estimated Creatinine Clearance: 35.1 mL/min (A) (by C-G formula based on SCr of 1.3 mg/dL (H)). Liver Function Tests: Recent Labs  Lab 09/18/19 1137  AST 19  ALT 16  ALKPHOS 77  BILITOT 1.3*  PROT 6.9  ALBUMIN 3.9   No results for input(s): LIPASE, AMYLASE in the last 168 hours. No results for input(s): AMMONIA in the last 168 hours. Coagulation Profile: Recent Labs  Lab 09/18/19 1137  INR 1.1   Cardiac Enzymes: No results for input(s): CKTOTAL, CKMB, CKMBINDEX, TROPONINI in the last 168 hours. BNP (last 3 results) No results for input(s): PROBNP in the last 8760 hours. HbA1C: No results for input(s): HGBA1C in the last 72 hours. CBG: No results for input(s): GLUCAP in the last 168 hours. Lipid Profile: No results for input(s): CHOL, HDL, LDLCALC, TRIG, CHOLHDL, LDLDIRECT in the last 72 hours. Thyroid Function Tests: No results for input(s): TSH, T4TOTAL, FREET4, T3FREE, THYROIDAB in the last 72 hours. Anemia Panel: No results for input(s): VITAMINB12, FOLATE, FERRITIN, TIBC, IRON, RETICCTPCT in the last 72 hours. Urine analysis:    Component Value Date/Time   COLORURINE YELLOW 09/18/2019 1138   APPEARANCEUR TURBID (A) 09/18/2019 1138   LABSPEC 1.013 09/18/2019 1138   PHURINE 6.0 09/18/2019 1138   GLUCOSEU NEGATIVE 09/18/2019 1138   HGBUR SMALL (A) 09/18/2019 1138   BILIRUBINUR NEGATIVE 09/18/2019 1138   KETONESUR NEGATIVE 09/18/2019 1138   PROTEINUR 100 (A) 09/18/2019 1138   NITRITE POSITIVE (A) 09/18/2019 1138   LEUKOCYTESUR LARGE (A) 09/18/2019 1138   Sepsis  Labs: @LABRCNTIP (procalcitonin:4,lacticidven:4)  ) Recent Results (from the past 240 hour(s))  Blood Culture (routine x 2)     Status: None (Preliminary result)   Collection Time: 09/18/19 11:38 AM   Specimen: BLOOD  Result Value Ref Range Status   Specimen Description   Final    BLOOD LEFT ARM Performed at [facility], [place] [address], [place], [place] [address]    Special Requests   Final    BOTTLES DRAWN AEROBIC AND ANAEROBIC Blood Culture adequate volume Performed at [place] [address] [place], [place] [address]    Culture  Setup Time   Final    GRAM NEGATIVE RODS AEROBIC BOTTLE ONLY CRITICAL RESULT CALLED TO, READ BACK BY AND VERIFIED WITH: [place]. [id number] FCP Performed at [facility], [place] [address], [place], [place] [address]    Culture GRAM NEGATIVE RODS  Final   Report Status PENDING  Incomplete  Blood Culture (routine x 2)     Status:  None (Preliminary result)   Collection Time: 09/18/19 11:38 AM   Specimen: BLOOD  Result Value Ref Range Status   Specimen Description   Final    BLOOD RIGHT ARM Performed at [place] [address] [place], [place] [address]    Special Requests   Final    BOTTLES DRAWN AEROBIC ONLY Blood Culture results may not be optimal due to an inadequate volume of blood received in culture bottles Performed at [place] [address], [place], [place] [address]    Culture   Final    NO GROWTH < 24 HOURS Performed at [facility] [address] [place], [place] [address]    Report Status PENDING  Incomplete  Urine culture     Status: Abnormal (Preliminary result)   Collection Time: 09/18/19 11:38 AM   Specimen: In/Out Cath Urine  Result Value Ref Range Status   Specimen Description   Final    IN/OUT CATH URINE Performed at [place] [address] [place], [place] [address]    Special Requests    Final    NONE Performed at [facility], [place] [address] [place], [place] [address]    Culture (A)  Final    >=100,000 COLONIES/mL ESCHERICHIA COLI SUSCEPTIBILITIES TO FOLLOW Performed at [facility], [place] [address] [place], [place] [address]    Report Status PENDING  Incomplete  Blood Culture ID Panel (Reflexed)     Status: Abnormal   Collection Time: 09/18/19 11:38 AM  Result Value Ref Range Status   Enterococcus species NOT DETECTED NOT DETECTED Final   Listeria monocytogenes NOT DETECTED NOT DETECTED Final   Staphylococcus species NOT DETECTED NOT DETECTED Final   Staphylococcus aureus (BCID) NOT DETECTED NOT DETECTED Final   Streptococcus species NOT DETECTED NOT DETECTED Final   Streptococcus agalactiae NOT DETECTED NOT DETECTED Final   Streptococcus pneumoniae NOT DETECTED NOT DETECTED Final   Streptococcus pyogenes NOT DETECTED NOT DETECTED Final   Acinetobacter baumannii NOT DETECTED NOT DETECTED Final   Enterobacteriaceae species DETECTED (A) NOT DETECTED Final    Comment: Enterobacteriaceae represent a large family of gram-negative bacteria, not a single organism. CRITICAL RESULT CALLED TO, READ BACK BY AND VERIFIED WITH: PHARMD DREW WOFFORD PO:[id number] FCP    Enterobacter cloacae complex NOT DETECTED NOT DETECTED Final   Escherichia coli DETECTED (A) NOT DETECTED Final    Comment: CRITICAL RESULT CALLED TO, READ BACK BY AND VERIFIED WITH: PHARMD DREW WOFFORD PO:[id number] FCP    Klebsiella oxytoca NOT DETECTED NOT DETECTED Final   Klebsiella pneumoniae NOT DETECTED NOT DETECTED Final   Proteus species NOT DETECTED NOT DETECTED Final   Serratia marcescens NOT DETECTED NOT DETECTED Final   Carbapenem resistance NOT DETECTED NOT DETECTED Final   Haemophilus influenzae NOT DETECTED NOT DETECTED Final   Neisseria meningitidis NOT DETECTED NOT DETECTED Final   Pseudomonas aeruginosa NOT DETECTED NOT DETECTED Final   Candida albicans NOT  DETECTED NOT DETECTED Final   Candida glabrata NOT DETECTED NOT DETECTED Final   Candida krusei NOT DETECTED NOT DETECTED Final   Candida parapsilosis NOT DETECTED NOT DETECTED Final   Candida tropicalis NOT DETECTED NOT DETECTED Final    Comment: Performed at [facility], [place] [address], [place], [place] [address]  Respiratory Panel by RT PCR (Flu A&B, Covid) - Nasopharyngeal Swab     Status: None   Collection Time: 09/18/19  1:20 PM  Specimen: Nasopharyngeal Swab  Result Value Ref Range Status   SARS Coronavirus 2 by RT PCR NEGATIVE NEGATIVE Final    Comment: (NOTE) SARS-CoV-2 target nucleic acids are NOT DETECTED. The SARS-CoV-2 RNA is generally detectable in upper respiratoy specimens during the acute phase of infection. The lowest concentration of SARS-CoV-2 viral copies this assay can detect is 131 copies/mL. A negative result does not preclude SARS-Cov-2 infection and should not be used as the sole basis for treatment or other patient management decisions. A negative result may occur with  improper specimen collection/handling, submission of specimen other than nasopharyngeal swab, presence of viral mutation(s) within the areas targeted by this assay, and inadequate number of viral copies (<131 copies/mL). A negative result must be combined with clinical observations, patient history, and epidemiological information. The expected result is Negative. Fact Sheet for Patients:  PinkCheek.be Fact Sheet for Healthcare Providers:  GravelBags.it This test is not yet ap proved or cleared by the [place] FDA and  has been authorized for detection and/or diagnosis of SARS-CoV-2 by FDA under an Emergency Use Authorization (EUA). This EUA will remain  in effect (meaning this test can be used) for the duration of the COVID-19 declaration under Section 564(b)(1) of the Act, 21 U.S.C. section 360bbb-3(b)(1), unless the  authorization is terminated or revoked sooner.    Influenza A by PCR NEGATIVE NEGATIVE Final   Influenza B by PCR NEGATIVE NEGATIVE Final    Comment: (NOTE) The Xpert Xpress SARS-CoV-2/FLU/RSV assay is intended as an aid in  the diagnosis of influenza from Nasopharyngeal swab specimens and  should not be used as a sole basis for treatment. Nasal washings and  aspirates are unacceptable for Xpert Xpress SARS-CoV-2/FLU/RSV  testing. Fact Sheet for Patients: PinkCheek.be Fact Sheet for Healthcare Providers: GravelBags.it This test is not yet approved or cleared by the [place] FDA and  has been authorized for detection and/or diagnosis of SARS-CoV-2 by  FDA under an Emergency Use Authorization (EUA). This EUA will remain  in effect (meaning this test can be used) for the duration of the  Covid-19 declaration under Section 564(b)(1) of the Act, 21  U.S.C. section 360bbb-3(b)(1), unless the authorization is  terminated or revoked. Performed at [facility] [place] [address], [place], [place] [address]       Studies: No results found.  Scheduled Meds: . brimonidine  1 drop Left Eye Q12H   And  . timolol  1 drop Left Eye Q12H  . brinzolamide  1 drop Left Eye BID  . Chlorhexidine Gluconate Cloth  6 each Topical Daily  . docusate sodium  100 mg Oral Daily  . DULoxetine  20 mg Oral QHS  . enoxaparin (LOVENOX) injection  40 mg Subcutaneous Q24H  . latanoprost  1 drop Left Eye QHS  . letrozole  2.5 mg Oral Daily  . levothyroxine  100 mcg Oral QAC breakfast  . pantoprazole  40 mg Oral Daily  . pregabalin  25 mg Oral QHS  . psyllium  1 packet Oral QHS    Continuous Infusions: . sodium chloride 100 mL/hr at 09/19/19 1038  . meropenem (MERREM) IV 1 g (09/19/19 1237)     LOS: 1 day     Kayleen Memos, MD [facility] Pager [phone]  If 7PM-7AM, please contact  night-coverage [email or web address] Password [facility] 09/19/2019, 3:04 PM

## 2019-09-19 NOTE — NC FL2 (Signed)
[place] LEVEL OF CARE SCREENING TOOL     IDENTIFICATION  Patient Name: Victoria Walsh Birthdate: July 27, 1932 Sex: female Admission Date (Current Location): 09/18/2019  [facility] and [place] Number:  Herbalist and Address:  [facility],  [place], [place]      Provider Number: [id number]  Attending Physician Name and Address:  Kayleen Memos, DO  Relative Name and Phone Number:  Andrey Alwood [phone]    Current Level of Care: Hospital Recommended Level of Care: [place], Memory Care Prior Approval Number:    Date Approved/Denied:   PASRR Number:    Discharge Plan: Other (Comment)(Return back to [facility] memory care)    Current Diagnoses: Patient Active Problem List   Diagnosis Date Noted  . Acute lower UTI 09/18/2019  . Sepsis ([place]) 09/18/2019  . AKI (acute kidney injury) ([place]) 09/18/2019  . Dementia with behavioral disturbance ([place]) 09/18/2019  . Ischemic stroke ([place]) 09/18/2019  . Hypothyroidism 09/18/2019  . Breast cancer ([place]) 09/18/2019    Orientation RESPIRATION BLADDER Height & Weight        Normal Indwelling catheter Weight: 79.9 kg Height:  5\' 8"  (172.7 cm)  BEHAVIORAL SYMPTOMS/MOOD NEUROLOGICAL BOWEL NUTRITION STATUS      Incontinent Diet(Heart Healthy)  AMBULATORY STATUS COMMUNICATION OF NEEDS Skin   Total Care Verbally                         Personal Care Assistance Level of Assistance  Bathing, Feeding, Dressing, Total care Bathing Assistance: Maximum assistance Feeding assistance: Maximum assistance Dressing Assistance: Maximum assistance Total Care Assistance: Maximum assistance   Functional Limitations Info  Sight, Hearing, Speech Sight Info: Adequate Hearing Info: Adequate Speech Info: Adequate    SPECIAL CARE FACTORS FREQUENCY  (w/c bound)                    Contractures Contractures Info: Not present    Additional Factors Info  Code  Status Code Status Info: Full code             Current Medications (09/19/2019):  This is the current hospital active medication list Current Facility-Administered Medications  Medication Dose Route Frequency Provider Last Rate Last Admin  . 0.9 %  sodium chloride infusion   Intravenous Continuous Gardiner Barefoot, NP 100 mL/hr at 09/19/19 0004 New Bag at 09/19/19 0004  . acetaminophen (TYLENOL) suppository 650 mg  650 mg Rectal Q6H PRN Gardiner Barefoot, NP   650 mg at 09/18/19 2053  . acetaminophen (TYLENOL) tablet 650 mg  650 mg Oral Q6H PRN Shelly Coss, MD   650 mg at 09/18/19 1549  . brimonidine (ALPHAGAN) 0.2 % ophthalmic solution 1 drop  1 drop Left Eye Q12H Shelly Coss, MD   1 drop at 09/18/19 2145   And  . timolol (TIMOPTIC) 0.5 % ophthalmic solution 1 drop  1 drop Left Eye Q12H Shelly Coss, MD   1 drop at 09/18/19 2145  . brinzolamide (AZOPT) 1 % ophthalmic suspension 1 drop  1 drop Left Eye BID Shelly Coss, MD   1 drop at 09/18/19 2147  . Chlorhexidine Gluconate Cloth 2 % PADS 6 each  6 each Topical Daily Hall, Carole N, DO      . docusate sodium (COLACE) capsule 100 mg  100 mg Oral Daily Adhikari, Amrit, MD      . DULoxetine (CYMBALTA) DR capsule 20 mg  20  mg Oral QHS Shelly Coss, MD   20 mg at 09/18/19 2146  . enoxaparin (LOVENOX) injection 40 mg  40 mg Subcutaneous Q24H Shelly Coss, MD   40 mg at 09/18/19 1813  . latanoprost (XALATAN) 0.005 % ophthalmic solution 1 drop  1 drop Left Eye QHS Shelly Coss, MD   1 drop at 09/18/19 2146  . letrozole [facility]) tablet 2.5 mg  2.5 mg Oral Daily Adhikari, Amrit, MD      . levothyroxine (SYNTHROID) tablet 100 mcg  100 mcg Oral QAC breakfast Shelly Coss, MD   100 mcg at 09/19/19 [id number]  . meropenem (MERREM) 1 g in sodium chloride 0.9 % 100 mL IVPB  1 g Intravenous Q12H Poindexter, Leann T, RPH 200 mL/hr at 09/19/19 0111 1 g at 09/19/19 0111  . ondansetron (ZOFRAN-ODT) disintegrating tablet 4 mg  4 mg  Oral Q8H PRN Gardiner Barefoot, NP   4 mg at 09/18/19 2138  . pantoprazole (PROTONIX) EC tablet 40 mg  40 mg Oral Daily Adhikari, Amrit, MD      . pregabalin (LYRICA) capsule 25 mg  25 mg Oral QHS Shelly Coss, MD   25 mg at 09/18/19 2139  . psyllium (HYDROCIL/METAMUCIL) packet 1 packet  1 packet Oral QHS Shelly Coss, MD   1 packet at 09/18/19 2146     Discharge Medications: Please see discharge summary for a list of discharge medications.  Relevant Imaging Results:  Relevant Lab Results:   Additional Information ss#[id number] [place]  Jaesean Litzau, [name], RN

## 2019-09-19 NOTE — Progress Notes (Signed)
PHARMACY - PHYSICIAN COMMUNICATION CRITICAL VALUE ALERT - BLOOD CULTURE IDENTIFICATION (BCID)  Victoria Walsh is an 86 y.o. female with chronic foley cath and hx ESBL Ecoli  who presented to [facility] on 09/18/2019 with a chief complaint of fever and clogged foley cath.  Meropenem started on adm.for suspected UTI.   Name of physician (or Provider) Contacted: Dr. Nevada Crane  Current antibiotics: meropenem  Changes to prescribed antibiotics recommended:  - continue with meropem  Results for orders placed or performed during the hospital encounter of 09/18/19  Blood Culture ID Panel (Reflexed) (Collected: 09/18/2019 11:38 AM)  Result Value Ref Range   Enterococcus species NOT DETECTED NOT DETECTED   Listeria monocytogenes NOT DETECTED NOT DETECTED   Staphylococcus species NOT DETECTED NOT DETECTED   Staphylococcus aureus (BCID) NOT DETECTED NOT DETECTED   Streptococcus species NOT DETECTED NOT DETECTED   Streptococcus agalactiae NOT DETECTED NOT DETECTED   Streptococcus pneumoniae NOT DETECTED NOT DETECTED   Streptococcus pyogenes NOT DETECTED NOT DETECTED   Acinetobacter baumannii NOT DETECTED NOT DETECTED   Enterobacteriaceae species DETECTED (A) NOT DETECTED   Enterobacter cloacae complex NOT DETECTED NOT DETECTED   Escherichia coli DETECTED (A) NOT DETECTED   Klebsiella oxytoca NOT DETECTED NOT DETECTED   Klebsiella pneumoniae NOT DETECTED NOT DETECTED   Proteus species NOT DETECTED NOT DETECTED   Serratia marcescens NOT DETECTED NOT DETECTED   Carbapenem resistance NOT DETECTED NOT DETECTED   Haemophilus influenzae NOT DETECTED NOT DETECTED   Neisseria meningitidis NOT DETECTED NOT DETECTED   Pseudomonas aeruginosa NOT DETECTED NOT DETECTED   Candida albicans NOT DETECTED NOT DETECTED   Candida glabrata NOT DETECTED NOT DETECTED   Candida krusei NOT DETECTED NOT DETECTED   Candida parapsilosis NOT DETECTED NOT DETECTED   Candida tropicalis NOT DETECTED NOT DETECTED    Victoria Walsh  P 09/19/2019  8:08 AM

## 2019-09-19 NOTE — Progress Notes (Signed)
Patient was on red mews at beginning of shift with elevated temp. Tylenol supp requested from  NP on call. Order for 250 NS bolus also given. Pt tolerated well. Will cont to monitor.

## 2019-09-19 NOTE — Progress Notes (Signed)
I attempted to feed pt but she declined food. She was compliant with drinking liquids but refused any bites of food.

## 2019-09-19 NOTE — Progress Notes (Signed)
Patient was bathing this morning and a bloody discoloration noted on bed pad. No source of bleeding noted. Patient alert and confuse at baseline. NP notified.

## 2019-09-19 NOTE — TOC Initial Note (Signed)
Transition of Care [facility]) - Initial/Assessment Note    Patient Details  Name: Victoria Walsh MRN: [id number] Date of Birth: [birth year]  Transition of Care [facility]) CM/SW Contact:    Dessa Phi, RN Phone Number: 09/19/2019, 10:28 AM  Clinical Narrative:  Spoke to son Victoria Walsh d/c plan to return back to [facility] [place]-w/c bound,f/c,l side hemiparesis.fl2 sent-facility rep [facility] following.covid neg 2/2.PTAR @ d/c.                 Expected Discharge Plan: Memory Care Barriers to Discharge: Continued Medical Work up   Patient Goals and CMS Choice Patient states their goals for this hospitalization and ongoing recovery are:: return back to ALF CMS Medicare.gov Compare Post Acute Care list provided to:: Patient Represenative (must comment)(Victoria Walsh-son [phone]) Choice offered to / list presented to : Adult Children  Expected Discharge Plan and Services Expected Discharge Plan: Memory Care   Discharge Planning Services: CM Consult   Living arrangements for the past 2 months: [place]                                      Prior Living Arrangements/Services Living arrangements for the past 2 months: [place] Lives with:: Facility Resident Patient language and need for interpreter reviewed:: Yes Do you feel safe going back to the place where you live?: Yes      Need for Family Participation in Patient Care: No (Comment) Care giver support system in place?: Yes (comment)   Criminal Activity/Legal Involvement Pertinent to Current Situation/Hospitalization: No - Comment as needed  Activities of Daily Living Home Assistive Devices/Equipment: Blood pressure cuff, Hospital bed, [employer], Grab bars around toilet, Grab bars in shower, Hand-held shower hose, Wheelchair, Scales([facility] has necessary equipment for their residents) ADL Screening (condition at time of admission) Patient's cognitive ability adequate to safely complete daily  activities?: No Is the patient deaf or have difficulty hearing?: Yes(slight hoh) Does the patient have difficulty seeing, even when wearing glasses/contacts?: Yes Does the patient have difficulty concentrating, remembering, or making decisions?: Yes Patient able to express need for assistance with ADLs?: No Does the patient have difficulty dressing or bathing?: Yes Independently performs ADLs?: No Communication: Independent Dressing (OT): Dependent Is this a change from baseline?: Pre-admission baseline Grooming: Dependent Is this a change from baseline?: Pre-admission baseline Feeding: Dependent Is this a change from baseline?: Pre-admission baseline Bathing: Dependent Is this a change from baseline?: Pre-admission baseline Toileting: Dependent Is this a change from baseline?: Pre-admission baseline In/Out Bed: Dependent Is this a change from baseline?: Pre-admission baseline Walks in Home: Dependent Is this a change from baseline?: Pre-admission baseline(patient bedbound) Does the patient have difficulty walking or climbing stairs?: Yes(secondary to weakness and stroke) Weakness of Legs: Both(L>R) Weakness of Arms/Hands: Both(L>R)  Permission Sought/Granted Permission sought to share information with : Case Manager Permission granted to share information with : Yes, Verbal Permission Granted  Share Information with NAME: Case Manager  Permission granted to share info w AGENCY: [facility]F [place] granted to share info w Relationship: Victoria Walsh son [phone]     Emotional Assessment Appearance:: Appears stated age Attitude/Demeanor/Rapport: Gracious Affect (typically observed): Accepting Orientation: : Oriented to Self Alcohol / Substance Use: Not Applicable Psych Involvement: No (comment)  Admission diagnosis:  Lower urinary tract infectious disease [N39.0] Sepsis ([place]) [A41.9] Sepsis with acute renal failure without septic shock, due to  unspecified organism,  unspecified acute renal failure type ([place]) [A41.9, R65.20, N17.9] Patient Active Problem List   Diagnosis Date Noted  . Acute lower UTI 09/18/2019  . Sepsis ([place]) 09/18/2019  . AKI (acute kidney injury) ([place]) 09/18/2019  . Dementia with behavioral disturbance ([place]) 09/18/2019  . Ischemic stroke ([place]) 09/18/2019  . Hypothyroidism 09/18/2019  . Breast cancer ([place]) 09/18/2019   PCP:  Reymundo Poll, MD Pharmacy:  No Pharmacies Listed    Social Determinants of Health (SDOH) Interventions    Readmission Risk Interventions Readmission Risk Prevention Plan 09/19/2019  Transportation Screening Complete  PCP or Specialist Appt within 3-5 Days Complete  HRI or [place] Complete  Social Work Consult for [place] Planning/Counseling Complete  Palliative Care Screening Complete  Medication Review Press photographer) Complete  Some recent data might be hidden

## 2019-09-20 LAB — CBC WITH DIFFERENTIAL/PLATELET
Abs Immature Granulocytes: 0.03 10*3/uL (ref 0.00–0.07)
Basophils Absolute: 0 10*3/uL (ref 0.0–0.1)
Basophils Relative: 0 %
Eosinophils Absolute: 0 10*3/uL (ref 0.0–0.5)
Eosinophils Relative: 0 %
HCT: 28.1 % — ABNORMAL LOW (ref 36.0–46.0)
Hemoglobin: 8.6 g/dL — ABNORMAL LOW (ref 12.0–15.0)
Immature Granulocytes: 0 %
Lymphocytes Relative: 8 %
Lymphs Abs: 0.7 10*3/uL (ref 0.7–4.0)
MCH: 31.9 pg (ref 26.0–34.0)
MCHC: 30.6 g/dL (ref 30.0–36.0)
MCV: 104.1 fL — ABNORMAL HIGH (ref 80.0–100.0)
Monocytes Absolute: 0.6 10*3/uL (ref 0.1–1.0)
Monocytes Relative: 6 %
Neutro Abs: 7.5 10*3/uL (ref 1.7–7.7)
Neutrophils Relative %: 86 %
Platelets: 132 10*3/uL — ABNORMAL LOW (ref 150–400)
RBC: 2.7 MIL/uL — ABNORMAL LOW (ref 3.87–5.11)
RDW: 13.5 % (ref 11.5–15.5)
WBC: 8.8 10*3/uL (ref 4.0–10.5)
nRBC: 0 % (ref 0.0–0.2)

## 2019-09-20 LAB — TSH: TSH: 1.04 u[IU]/mL (ref 0.350–4.500)

## 2019-09-20 LAB — BASIC METABOLIC PANEL
Anion gap: 9 (ref 5–15)
BUN: 30 mg/dL — ABNORMAL HIGH (ref 8–23)
CO2: 17 mmol/L — ABNORMAL LOW (ref 22–32)
Calcium: 7.5 mg/dL — ABNORMAL LOW (ref 8.9–10.3)
Chloride: 117 mmol/L — ABNORMAL HIGH (ref 98–111)
Creatinine, Ser: 1.13 mg/dL — ABNORMAL HIGH (ref 0.44–1.00)
GFR calc Af Amer: 51 mL/min — ABNORMAL LOW (ref >60)
GFR calc non Af Amer: 44 mL/min — ABNORMAL LOW (ref >60)
Glucose, Bld: 99 mg/dL (ref 70–99)
Potassium: 3.5 mmol/L (ref 3.5–5.1)
Sodium: 143 mmol/L (ref 135–145)

## 2019-09-20 LAB — URINE CULTURE: Culture: >=100000 — AB

## 2019-09-20 LAB — CREATININE, SERUM
Creatinine, Ser: 1.05 mg/dL — ABNORMAL HIGH (ref 0.44–1.00)
GFR calc Af Amer: 56 mL/min — ABNORMAL LOW (ref >60)
GFR calc non Af Amer: 48 mL/min — ABNORMAL LOW (ref >60)

## 2019-09-20 MED ORDER — METOPROLOL TARTRATE 5 MG/5ML IV SOLN
2.5000 mg | Freq: Four times a day (QID) | INTRAVENOUS | Status: DC | PRN
  Administered 2019-09-20 – 2019-09-21 (×3): 2.5 mg via INTRAVENOUS
  Filled 2019-09-20 (×3): qty 5

## 2019-09-20 MED ORDER — ASPIRIN EC 81 MG PO TBEC
81.0000 mg | DELAYED_RELEASE_TABLET | Freq: Every day | ORAL | Status: DC
  Administered 2019-09-20 – 2019-09-25 (×6): 81 mg via ORAL
  Filled 2019-09-20 (×6): qty 1

## 2019-09-20 MED ORDER — METOPROLOL TARTRATE 25 MG PO TABS
12.5000 mg | ORAL_TABLET | Freq: Two times a day (BID) | ORAL | Status: DC
  Administered 2019-09-20 – 2019-09-25 (×11): 12.5 mg via ORAL
  Filled 2019-09-20 (×11): qty 1

## 2019-09-20 NOTE — Progress Notes (Signed)
Notified by telemetry pt is in AFib RVR. EKG obtained to confirm, MD notified. HR sustaining in 120's, other vitals are stable. PO lopressor ordered. Will continue to monitor.   Tyqwan Pink, Bing Neighbors, RN

## 2019-09-20 NOTE — Progress Notes (Signed)
Spoke to pt's son [name] on the phone and updated him on pt's status and plan of care.  Braydn Carneiro, Bing Neighbors, RN

## 2019-09-20 NOTE — Progress Notes (Signed)
HR now sustaining in 130's still in AFib. BP 96/71, Axillary temp 99.8. MD made aware. IV lopressor ordered, IVF rate increased. Will continue to monitor.  Logan Baltimore, Bing Neighbors, RN

## 2019-09-20 NOTE — Progress Notes (Addendum)
PROGRESS NOTE  Victoria Walsh [id number] DOB: May 09, 1932 DOA: 09/18/2019 PCP: Reymundo Poll, MD  HPI/Recap of past 24 hours: Victoria Walsh is a 86 y.o. 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  Due to patient mental status, all information was taken from the son on phone.  Patient has neurogenic bladder from stroke so she has a chronic indwelling Foley catheter.  Facility reported that the catheter stopped draining.  When she presented here she had fever of 102 F, tachycardic.  Foley catheter was very dirty and it was not draining urine.  Her blood pressure was stable on presentation.  She has history of ESBL E. coli UTI. Patient seen and examined at the bedside in the emergency department.  Febrile during my presentation but hemodynamically stable.  There was no report of nausea, vomiting, diarrhea, abdominal pain, chest pain or shortness of breath or cough.  She was unable to provide any history.  She was not in any kind of distress.  Positive UA and urine culture greater than 100,000 colonies of E. coli.  E. coli bacteremia.  She is on meropenem for history of ESBL E. Coli UTI  09/20/19: Alert and more talkative today.  This afternoon, informed by bedside RN she went into Afib rvr.  No prior hx of Afib.   Hx of CVA on aspirin.  Will add on TSH.      Assessment/Plan: Principal Problem:   Sepsis ([place]) Active Problems:   Acute lower UTI   AKI (acute kidney injury) ([place])   Dementia with behavioral disturbance ([place])   Ischemic stroke ([place])   Hypothyroidism   Breast cancer ([place])  Sepsis secondary to E. coli bacteremia likely urinary source from E. coli UTI History of ESBL E. coli UTI. Positive blood culture for E. coli.  Awaiting  sensitivities.  Continue to follow cultures. Continue meropenem Repeat blood cultures x2 peripherally on 09/20/2019.  In process. Sepsis physiology is improving. Leukocytosis has resolved. Continue to monitor fever curve and WBC.  New A. fib with RVR Add on TSH CHADSVASC score 5 Continue to monitor, to see if sustained If persistent obtain 2D echo and anticoagulate Add low-dose p.o. Lopressor.  AKI likely prerenal in the setting of dehydration from poor oral intake Baseline creatinine appears to be 0.8 with GFR greater than 60 Presented with creatinine of 1.3 with GFR of 37. Creatinine improving down to 1.05. Avoid nephrotoxins and hypotension Monitor urine output Daily BMPs  Hx of ischemic CVA C/w ASA  Chronic macrocytic anemia Baseline hemoglobin appears to be 11 with MCV of 100. Further drop in hemoglobin down to 8.6. Baseline hemoglobin 11.8 Possibly dilutional No sign of overt bleeding Continue to monitor H&H  Dementia Reorient as needed Fall precautions  Hypothyroidism Continue Synthroid Add on TSH  GERD Continue PPI  History of left breast cancer  Continue letrozole   DVT prophylaxis: Lovenox subcu daily. Code Status: Full code Family Communication:  None at bedside. Consults called: None  Disposition: Patient is from SNF.  Anticipate discharge to SNF.  Barrier to discharge ongoing treatment for E. coli bacteremia.  Objective: Vitals:   09/19/19 2116 09/20/19 0059 09/20/19 0503 09/20/19 1334  BP: (!) 99/47 (!) 144/72 (!) 106/54 (!) 124/51  Pulse: 64 82 83 (!) 57  Resp: 17 18 16 19   Temp: (!) 97.5  F (36.4 C) 97.8 F (36.6 C) (!) 97.3 F (36.3 C) 98.4 F (36.9 C)  TempSrc: Oral Oral Oral   SpO2: 97% 99% 97% 96%  Weight:      Height:        Intake/Output Summary (Last 24 hours) at 09/20/2019 1524 Last data filed at 09/20/2019 [id number] Gross per 24 hour  Intake 1803.15 ml  Output 700 ml  Net 1103.15 ml   Filed Weights   09/18/19 1457  Weight:  79.9 kg    Exam:  . General: 86 y.o. year-old female pleasantly demented.  Well-developed well-nourished no acute distress.  Alert and interactive.   . Cardiovascular: Regular rate and rhythm at the time of this exam.  No rubs or gallops.  Marland Kitchen  Respiratory: Mild rales at bases no wheezing noted.  Poor inspiratory effort.   . Abdomen: Soft nontender normal bowel sounds present.   . Musculoskeletal: No lower extremity edema bilaterally. Psychiatry: Mood is appropriate for condition and setting.  Data Reviewed: CBC: Recent Labs  Lab 09/18/19 1137 09/19/19 0514 09/20/19 0500  WBC 18.7* 17.4* 8.8  NEUTROABS 16.6*  --  7.5  HGB 11.8* 9.5* 8.6*  HCT 37.7 30.5* 28.1*  MCV 99.5 102.3* 104.1*  PLT 217 155 [id number]*   Basic Metabolic Panel: Recent Labs  Lab 09/18/19 1137 09/19/19 0514 09/20/19 0500 09/20/19 0503  NA 141 142 143  --   K 3.9 4.0 3.5  --   CL 110 113* 117*  --   CO2 19* 22 17*  --   GLUCOSE 143* 123* 99  --   BUN 31* 31* 30*  --   CREATININE 1.35* 1.30* 1.13* 1.05*  CALCIUM 8.7* 7.9* 7.5*  --    GFR: Estimated Creatinine Clearance: 43.5 mL/min (A) (by C-G formula based on SCr of 1.05 mg/dL (H)). Liver Function Tests: Recent Labs  Lab 09/18/19 1137  AST 19  ALT 16  ALKPHOS 77  BILITOT 1.3*  PROT 6.9  ALBUMIN 3.9   No results for input(s): LIPASE, AMYLASE in the last 168 hours. No results for input(s): AMMONIA in the last 168 hours. Coagulation Profile: Recent Labs  Lab 09/18/19 1137  INR 1.1   Cardiac Enzymes: No results for input(s): CKTOTAL, CKMB, CKMBINDEX, TROPONINI in the last 168 hours. BNP (last 3 results) No results for input(s): PROBNP in the last 8760 hours. HbA1C: No results for input(s): HGBA1C in the last 72 hours. CBG: No results for input(s): GLUCAP in the last 168 hours. Lipid Profile: No results for input(s): CHOL, HDL, LDLCALC, TRIG, CHOLHDL, LDLDIRECT in the last 72 hours. Thyroid Function Tests: No results for input(s): TSH,  T4TOTAL, FREET4, T3FREE, THYROIDAB in the last 72 hours. Anemia Panel: No results for input(s): VITAMINB12, FOLATE, FERRITIN, TIBC, IRON, RETICCTPCT in the last 72 hours. Urine analysis:    Component Value Date/Time   COLORURINE YELLOW 09/18/2019 1138   APPEARANCEUR TURBID (A) 09/18/2019 1138   LABSPEC 1.013 09/18/2019 1138   PHURINE 6.0 09/18/2019 1138   GLUCOSEU NEGATIVE 09/18/2019 1138   HGBUR SMALL (A) 09/18/2019 1138   BILIRUBINUR NEGATIVE 09/18/2019 1138   [place] 09/18/2019 1138   PROTEINUR 100 (A) 09/18/2019 1138   NITRITE POSITIVE (A) 09/18/2019 1138   LEUKOCYTESUR LARGE (A) 09/18/2019 1138   Sepsis Labs: @LABRCNTIP (procalcitonin:4,lacticidven:4)  ) Recent Results (from the past 240 hour(s))  Blood Culture (routine x 2)     Status: Abnormal (Preliminary result)   Collection Time: 09/18/19 11:38 AM   Specimen: BLOOD  Result  Value Ref Range Status   Specimen Description   Final    BLOOD LEFT ARM Performed at [facility] [address], [place], [place] [address]    Special Requests   Final    BOTTLES DRAWN AEROBIC AND ANAEROBIC Blood Culture adequate volume Performed at [place] [address], [place], [place] [address]    Culture  Setup Time   Final    GRAM NEGATIVE RODS AEROBIC BOTTLE ONLY CRITICAL RESULT CALLED TO, READ BACK BY AND VERIFIED WITH: [place]. [id number] FCP Performed at [facility], [place] [address] [place], [place] [address]    Culture ESCHERICHIA COLI (A)  Final   Report Status PENDING  Incomplete  Blood Culture (routine x 2)     Status: None (Preliminary result)   Collection Time: 09/18/19 11:38 AM   Specimen: BLOOD  Result Value Ref Range Status   Specimen Description   Final    BLOOD RIGHT ARM Performed at [facility] [address], [place], [place] [address]    Special Requests   Final    BOTTLES DRAWN AEROBIC ONLY Blood Culture results may not  be optimal due to an inadequate volume of blood received in culture bottles Performed at [facility] [address], [place], [place] [address]    Culture   Final    NO GROWTH 2 DAYS Performed at [facility] [address], [place], [place] [address]    Report Status PENDING  Incomplete  Urine culture     Status: Abnormal   Collection Time: 09/18/19 11:38 AM   Specimen: In/Out Cath Urine  Result Value Ref Range Status   Specimen Description   Final    IN/OUT CATH URINE Performed at [facility] [address] [place], [place] [address]    Special Requests   Final    NONE Performed at [facility], [place] [address], [place], [place] [address]    Culture (A)  Final    >=100,000 COLONIES/mL ESCHERICHIA COLI Confirmed Extended Spectrum Beta-Lactamase Producer (ESBL).  In bloodstream infections from ESBL organisms, carbapenems are preferred over piperacillin/tazobactam. They are shown to have a lower risk of mortality.    Report Status 09/20/2019 FINAL  Final   Organism ID, Bacteria ESCHERICHIA COLI (A)  Final      Susceptibility   Escherichia coli - MIC*    AMPICILLIN >=32 RESISTANT Resistant     CEFAZOLIN >=64 RESISTANT Resistant     CEFTRIAXONE >=64 RESISTANT Resistant     CIPROFLOXACIN >=4 RESISTANT Resistant     GENTAMICIN <=1 SENSITIVE Sensitive     IMIPENEM <=0.25 SENSITIVE Sensitive     NITROFURANTOIN <=16 SENSITIVE Sensitive     TRIMETH/SULFA >=320 RESISTANT Resistant     AMPICILLIN/SULBACTAM >=32 RESISTANT Resistant     PIP/TAZO <=4 SENSITIVE Sensitive     * >=100,000 COLONIES/mL ESCHERICHIA COLI  Blood Culture ID Panel (Reflexed)     Status: Abnormal   Collection Time: 09/18/19 11:38 AM  Result Value Ref Range Status   Enterococcus species NOT DETECTED NOT DETECTED Final   Listeria monocytogenes NOT DETECTED NOT DETECTED Final   Staphylococcus species NOT DETECTED NOT DETECTED Final   Staphylococcus  aureus (BCID) NOT DETECTED NOT DETECTED Final   Streptococcus species NOT DETECTED NOT DETECTED Final   Streptococcus agalactiae NOT DETECTED NOT DETECTED Final   Streptococcus pneumoniae NOT DETECTED NOT DETECTED Final   Streptococcus pyogenes  NOT DETECTED NOT DETECTED Final   Acinetobacter baumannii NOT DETECTED NOT DETECTED Final   Enterobacteriaceae species DETECTED (A) NOT DETECTED Final    Comment: Enterobacteriaceae represent a large family of gram-negative bacteria, not a single organism. CRITICAL RESULT CALLED TO, READ BACK BY AND VERIFIED WITH: PHARMD DREW WOFFORD PO:[id number] FCP    Enterobacter cloacae complex NOT DETECTED NOT DETECTED Final   Escherichia coli DETECTED (A) NOT DETECTED Final    Comment: CRITICAL RESULT CALLED TO, READ BACK BY AND VERIFIED WITH: PHARMD DREW WOFFORD PO:[id number] FCP    Klebsiella oxytoca NOT DETECTED NOT DETECTED Final   Klebsiella pneumoniae NOT DETECTED NOT DETECTED Final   Proteus species NOT DETECTED NOT DETECTED Final   Serratia marcescens NOT DETECTED NOT DETECTED Final   Carbapenem resistance NOT DETECTED NOT DETECTED Final   Haemophilus influenzae NOT DETECTED NOT DETECTED Final   Neisseria meningitidis NOT DETECTED NOT DETECTED Final   Pseudomonas aeruginosa NOT DETECTED NOT DETECTED Final   Candida albicans NOT DETECTED NOT DETECTED Final   Candida glabrata NOT DETECTED NOT DETECTED Final   Candida krusei NOT DETECTED NOT DETECTED Final   Candida parapsilosis NOT DETECTED NOT DETECTED Final   Candida tropicalis NOT DETECTED NOT DETECTED Final    Comment: Performed at [facility], [place] [address] [place] [address] [place], [place] [address]  Respiratory Panel by RT PCR (Flu A&B, Covid) - Nasopharyngeal Swab     Status: None   Collection Time: 09/18/19  1:20 PM   Specimen: Nasopharyngeal Swab  Result Value Ref Range Status   SARS Coronavirus 2 by RT PCR NEGATIVE NEGATIVE Final    Comment: (NOTE) SARS-CoV-2 target nucleic acids are NOT  DETECTED. The SARS-CoV-2 RNA is generally detectable in upper respiratoy specimens during the acute phase of infection. The lowest concentration of SARS-CoV-2 viral copies this assay can detect is 131 copies/mL. A negative result does not preclude SARS-Cov-2 infection and should not be used as the sole basis for treatment or other patient management decisions. A negative result may occur with  improper specimen collection/handling, submission of specimen other than nasopharyngeal swab, presence of viral mutation(s) within the areas targeted by this assay, and inadequate number of viral copies (<131 copies/mL). A negative result must be combined with clinical observations, patient history, and epidemiological information. The expected result is Negative. Fact Sheet for Patients:  PinkCheek.be Fact Sheet for Healthcare Providers:  GravelBags.it This test is not yet ap proved or cleared by the [place] FDA and  has been authorized for detection and/or diagnosis of SARS-CoV-2 by FDA under an Emergency Use Authorization (EUA). This EUA will remain  in effect (meaning this test can be used) for the duration of the COVID-19 declaration under Section 564(b)(1) of the Act, 21 U.S.C. section 360bbb-3(b)(1), unless the authorization is terminated or revoked sooner.    Influenza A by PCR NEGATIVE NEGATIVE Final   Influenza B by PCR NEGATIVE NEGATIVE Final    Comment: (NOTE) The Xpert Xpress SARS-CoV-2/FLU/RSV assay is intended as an aid in  the diagnosis of influenza from Nasopharyngeal swab specimens and  should not be used as a sole basis for treatment. Nasal washings and  aspirates are unacceptable for Xpert Xpress SARS-CoV-2/FLU/RSV  testing. Fact Sheet for Patients: PinkCheek.be Fact Sheet for Healthcare Providers: GravelBags.it This test is not yet approved or cleared  by the [place] FDA and  has been authorized for detection and/or diagnosis of SARS-CoV-2 by  FDA under an Emergency Use Authorization (EUA). This EUA  will remain  in effect (meaning this test can be used) for the duration of the  Covid-19 declaration under Section 564(b)(1) of the Act, 21  U.S.C. section 360bbb-3(b)(1), unless the authorization is  terminated or revoked. Performed at [facility], [place] [address] [place], [place] [address]       Studies: No results found.  Scheduled Meds: . brimonidine  1 drop Left Eye Q12H   And  . timolol  1 drop Left Eye Q12H  . brinzolamide  1 drop Left Eye BID  . Chlorhexidine Gluconate Cloth  6 each Topical Daily  . docusate sodium  100 mg Oral Daily  . DULoxetine  20 mg Oral QHS  . enoxaparin (LOVENOX) injection  40 mg Subcutaneous Q24H  . latanoprost  1 drop Left Eye QHS  . letrozole  2.5 mg Oral Daily  . levothyroxine  100 mcg Oral QAC breakfast  . metoprolol tartrate  12.5 mg Oral BID  . pantoprazole  40 mg Oral Daily  . pregabalin  25 mg Oral QHS  . psyllium  1 packet Oral QHS    Continuous Infusions: . sodium chloride 100 mL/hr at 09/19/19 2144  . meropenem (MERREM) IV 1 g (09/20/19 1204)     LOS: 2 days     Kayleen Memos, MD [facility] Pager [phone]  If 7PM-7AM, please contact night-coverage [email or web address] Password [email or web address] 09/20/2019, 3:24 PM

## 2019-09-21 ENCOUNTER — Encounter (HOSPITAL_COMMUNITY): Payer: Self-pay | Admitting: Internal Medicine

## 2019-09-21 ENCOUNTER — Inpatient Hospital Stay (HOSPITAL_COMMUNITY): Payer: Medicare Other

## 2019-09-21 DIAGNOSIS — R6520 Severe sepsis without septic shock: Secondary | ICD-10-CM

## 2019-09-21 DIAGNOSIS — I480 Paroxysmal atrial fibrillation: Secondary | ICD-10-CM

## 2019-09-21 DIAGNOSIS — F0391 Unspecified dementia with behavioral disturbance: Secondary | ICD-10-CM

## 2019-09-21 DIAGNOSIS — I4891 Unspecified atrial fibrillation: Secondary | ICD-10-CM

## 2019-09-21 DIAGNOSIS — N170 Acute kidney failure with tubular necrosis: Secondary | ICD-10-CM

## 2019-09-21 DIAGNOSIS — A4151 Sepsis due to Escherichia coli [E. coli]: Principal | ICD-10-CM

## 2019-09-21 LAB — CBC WITH DIFFERENTIAL/PLATELET
Abs Immature Granulocytes: 0.03 10*3/uL (ref 0.00–0.07)
Basophils Absolute: 0 10*3/uL (ref 0.0–0.1)
Basophils Relative: 1 %
Eosinophils Absolute: 0.1 10*3/uL (ref 0.0–0.5)
Eosinophils Relative: 1 %
HCT: 30.7 % — ABNORMAL LOW (ref 36.0–46.0)
Hemoglobin: 9.3 g/dL — ABNORMAL LOW (ref 12.0–15.0)
Immature Granulocytes: 0 %
Lymphocytes Relative: 13 %
Lymphs Abs: 0.9 10*3/uL (ref 0.7–4.0)
MCH: 30.7 pg (ref 26.0–34.0)
MCHC: 30.3 g/dL (ref 30.0–36.0)
MCV: 101.3 fL — ABNORMAL HIGH (ref 80.0–100.0)
Monocytes Absolute: 0.6 10*3/uL (ref 0.1–1.0)
Monocytes Relative: 9 %
Neutro Abs: 5.2 10*3/uL (ref 1.7–7.7)
Neutrophils Relative %: 76 %
Platelets: 136 10*3/uL — ABNORMAL LOW (ref 150–400)
RBC: 3.03 MIL/uL — ABNORMAL LOW (ref 3.87–5.11)
RDW: 13.6 % (ref 11.5–15.5)
WBC: 6.7 10*3/uL (ref 4.0–10.5)
nRBC: 0 % (ref 0.0–0.2)

## 2019-09-21 LAB — ECHOCARDIOGRAM COMPLETE
Height: 68 in
Weight: 2917.13 oz

## 2019-09-21 LAB — CULTURE, BLOOD (ROUTINE X 2): Special Requests: ADEQUATE

## 2019-09-21 LAB — BASIC METABOLIC PANEL
Anion gap: 5 (ref 5–15)
BUN: 26 mg/dL — ABNORMAL HIGH (ref 8–23)
CO2: 18 mmol/L — ABNORMAL LOW (ref 22–32)
Calcium: 8 mg/dL — ABNORMAL LOW (ref 8.9–10.3)
Chloride: 120 mmol/L — ABNORMAL HIGH (ref 98–111)
Creatinine, Ser: 1 mg/dL (ref 0.44–1.00)
GFR calc Af Amer: 59 mL/min — ABNORMAL LOW (ref >60)
GFR calc non Af Amer: 51 mL/min — ABNORMAL LOW (ref >60)
Glucose, Bld: 104 mg/dL — ABNORMAL HIGH (ref 70–99)
Potassium: 3.6 mmol/L (ref 3.5–5.1)
Sodium: 143 mmol/L (ref 135–145)

## 2019-09-21 LAB — LIPID PANEL
Cholesterol: 123 mg/dL (ref 0–200)
HDL: 20 mg/dL — ABNORMAL LOW (ref >40)
LDL Cholesterol: 68 mg/dL (ref 0–99)
Total CHOL/HDL Ratio: 6.2 RATIO
Triglycerides: 173 mg/dL — ABNORMAL HIGH (ref <150)
VLDL: 35 mg/dL (ref 0–40)

## 2019-09-21 LAB — VITAMIN B12: Vitamin B-12: 275 pg/mL (ref 180–914)

## 2019-09-21 MED ORDER — LEVALBUTEROL HCL 0.63 MG/3ML IN NEBU
0.6300 mg | INHALATION_SOLUTION | Freq: Three times a day (TID) | RESPIRATORY_TRACT | Status: DC
  Administered 2019-09-22: 0.63 mg via RESPIRATORY_TRACT
  Filled 2019-09-21: qty 3

## 2019-09-21 MED ORDER — DILTIAZEM HCL-DEXTROSE 125-5 MG/125ML-% IV SOLN (PREMIX)
5.0000 mg/h | INTRAVENOUS | Status: DC
  Administered 2019-09-21: 5 mg/h via INTRAVENOUS
  Filled 2019-09-21 (×2): qty 125

## 2019-09-21 MED ORDER — LEVALBUTEROL HCL 0.63 MG/3ML IN NEBU
0.6300 mg | INHALATION_SOLUTION | Freq: Four times a day (QID) | RESPIRATORY_TRACT | Status: DC
  Administered 2019-09-21 (×3): 0.63 mg via RESPIRATORY_TRACT
  Filled 2019-09-21 (×3): qty 3

## 2019-09-21 MED ORDER — IPRATROPIUM BROMIDE 0.02 % IN SOLN
0.5000 mg | Freq: Four times a day (QID) | RESPIRATORY_TRACT | Status: DC
  Administered 2019-09-21 (×2): 0.5 mg via RESPIRATORY_TRACT
  Filled 2019-09-21 (×2): qty 2.5

## 2019-09-21 MED ORDER — FUROSEMIDE 10 MG/ML IJ SOLN
20.0000 mg | Freq: Once | INTRAMUSCULAR | Status: AC
  Administered 2019-09-21: 20 mg via INTRAVENOUS
  Filled 2019-09-21: qty 2

## 2019-09-21 MED ORDER — ATORVASTATIN CALCIUM 20 MG PO TABS
20.0000 mg | ORAL_TABLET | Freq: Every day | ORAL | Status: DC
  Administered 2019-09-21 – 2019-09-24 (×4): 20 mg via ORAL
  Filled 2019-09-21 (×4): qty 1

## 2019-09-21 MED ORDER — APIXABAN 5 MG PO TABS
5.0000 mg | ORAL_TABLET | Freq: Two times a day (BID) | ORAL | Status: DC
  Administered 2019-09-21 – 2019-09-25 (×8): 5 mg via ORAL
  Filled 2019-09-21 (×8): qty 1

## 2019-09-21 MED ORDER — IPRATROPIUM BROMIDE 0.02 % IN SOLN
0.5000 mg | Freq: Three times a day (TID) | RESPIRATORY_TRACT | Status: DC
  Administered 2019-09-22: 0.5 mg via RESPIRATORY_TRACT
  Filled 2019-09-21: qty 2.5

## 2019-09-21 MED ORDER — DILTIAZEM LOAD VIA INFUSION
5.0000 mg | Freq: Once | INTRAVENOUS | Status: AC
  Administered 2019-09-21: 5 mg via INTRAVENOUS
  Filled 2019-09-21: qty 5

## 2019-09-21 NOTE — Progress Notes (Addendum)
ANTICOAGULATION CONSULT NOTE - Initial Consult  Pharmacy Consult for apixaban Indication:   Allergies  Allergen Reactions  . Ciprofloxacin Other (See Comments)    On MAR  . Oxaprozin Other (See Comments)    DAYPRO (SULFA DRUG) On MAR  . Sulfa Antibiotics Other (See Comments)    On MAR    Patient Measurements: Height: 5\' 8"  (172.7 cm) Weight: 182 lb 5.1 oz (82.7 kg) IBW/kg (Calculated) : 63.9 Heparin Dosing Weight:    Vital Signs: BP: 120/107 (02/05 1627) Pulse Rate: 91 (02/05 1627)  Labs: Recent Labs    09/19/19 0514 09/19/19 0514 09/20/19 0500 09/20/19 0503 09/21/19 0401  HGB 9.5*   < > 8.6*  --  9.3*  HCT 30.5*  --  28.1*  --  30.7*  PLT 155  --  132*  --  136*  CREATININE 1.30*   < > 1.13* 1.05* 1.00   < > = values in this interval not displayed.    Estimated Creatinine Clearance: 46.4 mL/min (by C-G formula based on SCr of 1 mg/dL).   Medical History: Past Medical History:  Diagnosis Date  . Chronic kidney disease   . CVA (cerebral infarction)   . Dementia ([place])   . Hyperlipidemia   . Osteopenia   . UTI (lower urinary tract infection)     Medications:  Medications Prior to Admission  Medication Sig Dispense Refill Last Dose  . acetaminophen (TYLENOL) 325 MG tablet Take 650 mg by mouth 2 (two) times daily.   09/18/2019 at Unknown time  . aspirin EC 81 MG tablet Take 81 mg by mouth daily.   09/18/2019 at Unknown time  . brimonidine-timolol (COMBIGAN) 0.2-0.5 % ophthalmic solution Place 1 drop into the left eye every 12 (twelve) hours.   09/18/2019 at Unknown time  . brinzolamide (AZOPT) 1 % ophthalmic suspension Place 1 drop into the left eye 2 (two) times daily.   09/18/2019 at Unknown time  . camphor-menthol (SARNA) lotion Apply 1 application topically every 8 (eight) hours as needed for itching. Apply to left ear topically for itching unsupervised self administration may keep in room and self applyprn   unknown at prn  . Cranberry 450 MG CAPS Take 450 mg by  mouth 2 (two) times daily.   09/18/2019 at Unknown time  . docusate sodium (COLACE) 100 MG capsule Take 100 mg by mouth daily.   09/18/2019 at Unknown time  . DULoxetine (CYMBALTA) 20 MG capsule Take 20 mg by mouth at bedtime.    09/17/2019 at Unknown time  . latanoprost (XALATAN) 0.005 % ophthalmic solution Place 1 drop into the left eye at bedtime.   09/17/2019 at Unknown time  . letrozole (FEMARA) 2.5 MG tablet Take 2.5 mg by mouth daily.   09/18/2019 at Unknown time  . levothyroxine (SYNTHROID, LEVOTHROID) 100 MCG tablet Take 100 mcg by mouth daily before breakfast.    09/18/2019 at 0900  . Melatonin 3 MG TABS Take 3 mg by mouth at bedtime.   09/17/2019 at Unknown time  . Menthol-Zinc Oxide (CALMOSEPTINE) 0.44-20.6 % OINT Apply 1 application topically 2 (two) times daily. Apply to buttocks for rednedd   09/18/2019 at Unknown time  . Multiple Vitamins-Iron (MULTIVITAMINS WITH IRON) TABS tablet Take 1 tablet by mouth at bedtime.   09/17/2019 at Unknown time  . Multiple Vitamins-Minerals (ICAPS AREDS 2) CAPS Take 1 capsule by mouth at bedtime.    09/17/2019 at Unknown time  . omeprazole (PRILOSEC) 20 MG capsule Take 20 mg by mouth  daily.   09/18/2019 at Unknown time  . pregabalin (LYRICA) 25 MG capsule Take 25 mg by mouth at bedtime.   09/17/2019 at Unknown time  . Psyllium 500 MG CAPS Take 500 mg by mouth at bedtime.    09/17/2019 at Unknown time  . senna (SENOKOT) 8.6 MG TABS tablet Take 1 tablet by mouth See admin instructions. Monday, Wednesday and Friday in the evening   09/17/2019 at Unknown time  . traMADol (ULTRAM) 50 MG tablet Take 1 tablet (50 mg total) by mouth 2 (two) times daily. 20 tablet 0 09/18/2019 at Unknown time    Assessment: 86 yo F sent from [facility] to ED with fever and AMS on 09/18/19.  Pharmacy consulted to dose apixaban for afib. Hg 9.3 low but stable. PLTC 136 low.  No bleeding reported.  Age > 80, Wt > 60, SCr < 1.5.    Plan:  DC LMWH 40 Apixaban 5 mg po bid Will educate and give 30 day free card  prior to [place], Pharm.D 09/21/2019 4:39 PM

## 2019-09-21 NOTE — Discharge Instructions (Signed)

## 2019-09-21 NOTE — Progress Notes (Signed)
Current patient BP: 111/83 & HR: 97.  Order parameters are such that no infusion rate adjustment needs to be made at this time.  Diltiazem infusion still running at 5 mg/hr.

## 2019-09-21 NOTE — Progress Notes (Signed)
NT alerted RN that patient with chronic foley catheter had removed the catheter.  RN successfully inserted a new foley catheter.  Chancy Hurter, RN and Raynelle Jan, [place] assisted.

## 2019-09-21 NOTE — Progress Notes (Signed)
Pharmacy Antibiotic Note  Victoria Walsh is a 86 y.o. female with chronic foley cath and hx ESBL Ecoli UTI presented to the ED on 09/18/2019 with fever and clogged foley cath.  She was started on meropenem on admission for suspected UTI.  BCx and UCx are both positive for Ecoli.  Today, 09/21/2019: - day #4 abx - afeb, WBC down wnl - SCr 1 (CrCl~46)   Plan: - Meropenem 1g IV q12h (for crcl 25-50) - f/u suscept. for Ecoli in bcx _______________________________________  Height: 5\' 8"  (172.7 cm) Weight: 182 lb 5.1 oz (82.7 kg) IBW/kg (Calculated) : 63.9  Temp (24hrs), Avg:98.4 F (36.9 C), Min:97.7 F (36.5 C), Max:99.8 F (37.7 C)  Recent Labs  Lab 09/18/19 1137 09/19/19 0514 09/20/19 0500 09/20/19 0503 09/21/19 0401  WBC 18.7* 17.4* 8.8  --  6.7  CREATININE 1.35* 1.30* 1.13* 1.05* 1.00  LATICACIDVEN 1.4  --   --   --   --     Estimated Creatinine Clearance: 46.4 mL/min (by C-G formula based on SCr of 1 mg/dL).    Allergies  Allergen Reactions  . Ciprofloxacin Other (See Comments)    On MAR  . Oxaprozin Other (See Comments)    DAYPRO (SULFA DRUG) On MAR  . Sulfa Antibiotics Other (See Comments)    On MAR    Antimicrobials this admission:  2/2 Meropenem >>    Microbiology results:  Previous: 09/21/18 ucx: >100K ESBL Ecoli  04/29/19 ucx: >100k proteus (R= nitrof); 80K ESBL ecoli (S=gent, imi, nitro, zosyn)  Current: 2/2 BCx: 1/3 bottles with GNR (BCID=Ecoli) 2/2 UCx: ESBL Ecoli (S= gent, imi, nitro, zosyn) FINAL 2/2 Resp Panel:  neg for covid and influ 2/2 UA: many bac, >50 wbc, large leuk, nitrite pos 2/4 repeat BCx x2:    Thank you for allowing pharmacy to be a part of this patient's care.  Lynelle Doctor 09/21/2019 8:29 AM

## 2019-09-21 NOTE — Progress Notes (Signed)
RN followed pharmacy instructions to start diltiazem infusion with initial bolus of 5 mg/3 minutes.  RN set infusion rate for 1 hour at ordered rate of 5 mg/hr.    RN checked patient BP & HR one hour after.  Patient BP: 120/107 & HR: 91.  Order parameters are such that no infusion rate adjustment needs to be made at this time.  Diltiazem infusion still running at 5 mg/hr.

## 2019-09-21 NOTE — Consult Note (Addendum)
Cardiology Consultation:   Patient ID: Victoria Walsh MRN: [id number]; DOB: June 04, 1932  Admit date: 09/18/2019 Date of Consult: 09/21/2019  Primary Care Provider: Reymundo Poll, MD Primary Cardiologist: No primary care provider on file. new Primary Electrophysiologist:  None    Patient Profile:   Victoria Walsh is a 86 y.o. 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 and sent to ER for foley not draining (neurogenic bladder from CVA with chronic foley) hx of UTI  who is being seen today for the evaluation of atrial fib with RVR at the request of Dr. [place] Walsh.  History of Present Illness:   Victoria Walsh with above hx and CVA prior to 2013- pt tells me she had a fib and was on coumadin -this changed to ASA/plavix and then switched to xarelto at one point  ( ER note 07/2018 with PAF but review of strips only show SR with PACs) she has been on xarelto since 2016 by ER records.  She was still taking 04/2019.  She has had falls and GI bleed and GU bleeds/freq UTIs in past.   Current meds do not list xarelto but I do not see stopping either. Perhaps as outpt per PCP.  Hx of HTN, hypothyroid, HLD, GERD  Now admitted 09/18/19 with confusion, fever and UTI.  Fever here was 102 +sepsis,   Hx TEE in 2009 Ef 60-65% small PFO at that time.  No thrombus    EKG:  The EKG was personally reviewed and demonstrates:  Appears a fib with RVR   Telemetry:  Telemetry was personally reviewed and demonstrates:  09/20/19 SR and now a fib wit RVR.   Na 143, k+ 3.6, BUN 26, Cr 1.00 today down from 1.35 on admit.  Hgb 9.3, down from 11.8 on admit  WBC today 6.7 down from 18.7 on admit, plts 136 were 217 on admit. TSH 1.040  COVID neg  CXR IMPRESSION: Low lung volumes with mild bibasilar atelectasis. Mild left base infiltrate cannot be excluded.  Echo pending  Currently BP 120/76 P 60 R 18  She feels well has some  chest pain more diaphramatic but no SOB.    Heart Pathway Score:     Past Medical History:  Diagnosis Date  . Chronic kidney disease   . CVA (cerebral infarction)   . Dementia ([place])   . Hyperlipidemia   . Osteopenia   . UTI (lower urinary tract infection)     Past Surgical History:  Procedure Laterality Date  . ABDOMINAL HYSTERECTOMY    . BREAST LUMPECTOMY    . REPLACEMENT TOTAL KNEE    . TOTAL HIP ARTHROPLASTY       Home Medications:  Prior to Admission medications   Medication Sig Start Date End Date Taking? Authorizing Provider  acetaminophen (TYLENOL) 325 MG tablet Take 650 mg by mouth 2 (two) times daily.   Yes [provider]  aspirin EC 81 MG tablet Take 81 mg by mouth daily.   Yes [provider]  brimonidine-timolol (COMBIGAN) 0.2-0.5 % ophthalmic solution Place 1 drop into the left eye every 12 (twelve) hours.   Yes [provider]  brinzolamide (AZOPT) 1 % ophthalmic suspension Place 1 drop into the left eye 2 (two) times daily.   Yes [provider]  camphor-menthol Timoteo Ace) lotion Apply 1 application topically every 8 (eight) hours as needed for itching. Apply to left ear topically for itching  unsupervised self administration may keep in room and self applyprn   Yes [provider]  Cranberry 450 MG CAPS Take 450 mg by mouth 2 (two) times daily.   Yes [provider]  docusate sodium (COLACE) 100 MG capsule Take 100 mg by mouth daily.   Yes [provider]  DULoxetine (CYMBALTA) 20 MG capsule Take 20 mg by mouth at bedtime.    Yes [provider]  latanoprost (XALATAN) 0.005 % ophthalmic solution Place 1 drop into the left eye at bedtime.   Yes [provider]  letrozole (FEMARA) 2.5 MG tablet Take 2.5 mg by mouth daily.   Yes [provider]  levothyroxine (SYNTHROID, LEVOTHROID) 100 MCG tablet Take 100 mcg by mouth daily before breakfast.    Yes [provider]    Melatonin 3 MG TABS Take 3 mg by mouth at bedtime.   Yes [provider]  Menthol-Zinc Oxide (CALMOSEPTINE) 0.44-20.6 % OINT Apply 1 application topically 2 (two) times daily. Apply to buttocks for rednedd   Yes [provider]  Multiple Vitamins-Iron (MULTIVITAMINS WITH IRON) TABS tablet Take 1 tablet by mouth at bedtime.   Yes [provider]  Multiple Vitamins-Minerals (ICAPS AREDS 2) CAPS Take 1 capsule by mouth at bedtime.    Yes [provider]  omeprazole (PRILOSEC) 20 MG capsule Take 20 mg by mouth daily.   Yes [provider]  pregabalin (LYRICA) 25 MG capsule Take 25 mg by mouth at bedtime.   Yes [provider]  Psyllium 500 MG CAPS Take 500 mg by mouth at bedtime.    Yes [provider]  senna (SENOKOT) 8.6 MG TABS tablet Take 1 tablet by mouth See admin instructions. Monday, Wednesday and Friday in the evening   Yes [provider]  traMADol (ULTRAM) 50 MG tablet Take 1 tablet (50 mg total) by mouth 2 (two) times daily. 04/26/19  Yes Drenda Freeze, MD    Inpatient Medications: Scheduled Meds: . aspirin EC  81 mg Oral Daily  . brimonidine  1 drop Left Eye Q12H   And  . timolol  1 drop Left Eye Q12H  . brinzolamide  1 drop Left Eye BID  . Chlorhexidine Gluconate Cloth  6 each Topical Daily  . docusate sodium  100 mg Oral Daily  . DULoxetine  20 mg Oral QHS  . enoxaparin (LOVENOX) injection  40 mg Subcutaneous Q24H  . ipratropium  0.5 mg Nebulization Q6H  . latanoprost  1 drop Left Eye QHS  . letrozole  2.5 mg Oral Daily  . levalbuterol  0.63 mg Nebulization Q6H  . levothyroxine  100 mcg Oral QAC breakfast  . metoprolol tartrate  12.5 mg Oral BID  . pantoprazole  40 mg Oral Daily  . pregabalin  25 mg Oral QHS  . psyllium  1 packet Oral QHS   Continuous Infusions: . meropenem (MERREM) IV 1 g (09/21/19 0000)   PRN Meds: acetaminophen, acetaminophen, metoprolol tartrate, ondansetron  Allergies:     Allergies  Allergen Reactions  . Ciprofloxacin Other (See Comments)    On MAR  . Oxaprozin Other (See Comments)    DAYPRO (SULFA DRUG) On MAR  . Sulfa Antibiotics Other (See Comments)    On MAR    Social History:   Social History   Socioeconomic History  . Marital status: Widowed    Spouse name: Not on file  . Number of children: Not on file  . Years of education: Not on  file  . Highest education level: Not on file  Occupational History  . Not on file  Tobacco Use  . Smoking status: Never Smoker  . Smokeless tobacco: Never Used  Substance and Sexual Activity  . Alcohol use: Yes    Comment: socially: Once every month  . Drug use: No  . Sexual activity: Not on file  Other Topics Concern  . Not on file  Social History Narrative  . Not on file   Social Determinants of Health   Financial Resource Strain:   . Difficulty of Paying Living Expenses: Not on file  Food Insecurity:   . Worried About Charity fundraiser in the Last Year: Not on file  . Ran Out of Food in the Last Year: Not on file  Transportation Needs:   . Lack of Transportation (Medical): Not on file  . Lack of Transportation (Non-Medical): Not on file  Physical Activity:   . Days of Exercise per Week: Not on file  . Minutes of Exercise per Session: Not on file  Stress:   . Feeling of Stress : Not on file  Social Connections:   . Frequency of Communication with Friends and Family: Not on file  . Frequency of Social Gatherings with Friends and Family: Not on file  . Attends Religious Services: Not on file  . Active Member of Clubs or Organizations: Not on file  . Attends Archivist Meetings: Not on file  . Marital Status: Not on file  Intimate Partner Violence:   . Fear of Current or Ex-Partner: Not on file  . Emotionally Abused: Not on file  . Physically Abused: Not on file  . Sexually Abused: Not on file    Family History:   History reviewed. No pertinent family history.  Unable to  give FH. Due to dementia  ROS:  Please see the history of present illness.  General:no colds or fevers, no weight changes Skin:no rashes or ulcers HEENT:no blurred vision, no congestion CV:see HPI PUL:see HPI GI:no diarrhea constipation or melena, no indigestion GU:no hematuria, no dysuria MS:no joint pain, no claudication Neuro:no syncope, no lightheadedness Endo:no diabetes now, no thyroid disease  All other ROS reviewed and negative.     Physical Exam/Data:   Vitals:   09/21/19 0237 09/21/19 0413 09/21/19 0426 09/21/19 0754  BP: (!) 124/108 120/76    Pulse: (!) 141 60    Resp:  18    Temp:  97.7 F (36.5 C)    TempSrc:  Oral    SpO2:  97%  91%  Weight:   82.7 kg   Height:        Intake/Output Summary (Last 24 hours) at 09/21/2019 1102 Last data filed at 09/21/2019 [id number] Gross per 24 hour  Intake 2292.59 ml  Output 650 ml  Net 1642.59 ml   Last 3 Weights 09/21/2019 09/18/2019 11/12/2018  Weight (lbs) 182 lb 5.1 oz 176 lb 2.4 oz 165 lb  Weight (kg) 82.7 kg 79.9 kg 74.844 kg     Body mass index is 27.72 kg/m.  General:  Well nourished, well developed, in no acute distress  HEENT: normal Lymph: no adenopathy Neck: mild JVD Endocrine:  No thryomegaly Vascular: No carotid bruits; pedal pulses 2+ bilaterally   Cardiac:  irreg irreg ; no murmur gallup rub or click Lungs:  clear to auscultation bilaterally, no wheezing, rhonchi or rales  Abd: soft, nontender, no hepatomegaly  Ext: no edema Musculoskeletal:  No deformities, BUE and BLE strength normal  and equal Skin: warm and dry  Neuro:  oriented to person, hospital, thought city was [place] - did not know month MAE Psych:  Normal affect    Relevant CV Studies: Echo pending    Laboratory Data:  High Sensitivity Troponin:  No results for input(s): TROPONINIHS in the last 720 hours.   Chemistry Recent Labs  Lab 09/19/19 0514 09/19/19 0514 09/20/19 0500 09/20/19 0503 09/21/19 0401  NA 142  --  143  --   143  K 4.0  --  3.5  --  3.6  CL 113*  --  117*  --  120*  CO2 22  --  17*  --  18*  GLUCOSE 123*  --  99  --  104*  BUN 31*  --  30*  --  26*  CREATININE 1.30*   < > 1.13* 1.05* 1.00  CALCIUM 7.9*  --  7.5*  --  8.0*  GFRNONAA 37*   < > 44* 48* 51*  GFRAA 43*   < > 51* 56* 59*  ANIONGAP 7  --  9  --  5   < > = values in this interval not displayed.    Recent Labs  Lab 09/18/19 1137  PROT 6.9  ALBUMIN 3.9  AST 19  ALT 16  ALKPHOS 77  BILITOT 1.3*   Hematology Recent Labs  Lab 09/19/19 0514 09/20/19 0500 09/21/19 0401  WBC 17.4* 8.8 6.7  RBC 2.98* 2.70* 3.03*  HGB 9.5* 8.6* 9.3*  HCT 30.5* 28.1* 30.7*  MCV 102.3* 104.1* 101.3*  MCH 31.9 31.9 30.7  MCHC 31.1 30.6 30.3  RDW 13.5 13.5 13.6  PLT 155 132* 136*   BNPNo results for input(s): BNP, PROBNP in the last 168 hours.  DDimer No results for input(s): DDIMER in the last 168 hours.   Radiology/Studies:  DG CHEST PORT 1 VIEW  Result Date: 09/21/2019 CLINICAL DATA:  Wheezing, dementia EXAM: PORTABLE CHEST 1 VIEW COMPARISON:  09/18/2019 FINDINGS: Single frontal view of the chest demonstrates a stable cardiac silhouette. Increasing interstitial prominence with Kerley B-lines consistent with mild interstitial edema. No airspace disease, effusion, or pneumothorax. IMPRESSION: 1. Mild interstitial edema. Electronically Signed   By: Randa Ngo M.D.   On: 09/21/2019 08:53   DG Chest Port 1 View  Result Date: 09/18/2019 CLINICAL DATA:  Fever.  Altered mental status. EXAM: PORTABLE CHEST 1 VIEW COMPARISON:  Chest x-ray 04/26/2019. FINDINGS: Mediastinum heart size appears stable. Low lung volumes. Mild bibasilar atelectasis. Mild left base infiltrate cannot be excluded. No pleural effusion or pneumothorax. Surgical clips right chest. IMPRESSION: Low lung volumes with mild bibasilar atelectasis. Mild left base infiltrate cannot be excluded. Electronically Signed   By: [place]   On: 09/18/2019 12:11        No chest  pain  Assessment and Plan:   1. PAF with RVR  I find no prior a fib in chart and pt tells me she had atrial fib with CVA priro to 2013 and on coumadin   echo pending CHA2DS2VASc of 6.  She has had multiple falls in past so high risk for anticoagulation. Marland Kitchen  She was in sR with SVT bursts until 09/20/19 and went into a fib with RVR  She has had lopressor 12.5 BID may need to add dilt IV to control rate, this may be causing her SOB.  Echo pending. Would recommend eliquis   2. SOB rec'd lasix  This AM  3. Sepsis/bacteremia E. Coli  per  IM -UTI with chronic foley  4. Hx of CVA before 2013 origianl CVA on coumadin but in 2013 and 2014 was on ASA and plavix. - between 2014 and 2016 was placed on xarelto and this was stopped sometime after 04/2019.  She did have several falls which may have led to her stopping.  5. HTN hx of now 120/76 and no meds for HTN as outpt,  BP low at times here with her sepsis.   6. Hypothyroid with stable TSH  7. AKI improved, prob due to dehydration       For questions or updates, please contact [place] Please consult [email or web address] for contact info under     Signed, Cecilie Kicks, NP  09/21/2019 11:02 AM   I have seen and examined the patient along with Cecilie Kicks, NP .  I have reviewed the chart, notes and new data.  I agree with PA/NP's note.  Key new complaints: sleepy, but easy to awake, partly oriented, appears comfortable lying flat Key examination changes: rapid irregular rhythm, short systolic aortic murmur, no edema or JVD, clear lungs Key new findings / data: AFib with RVR since 1400h yesterday. Echo reviewed - normal LVEF, Ao valve sclerosis without stenosis, 2+ AI, LA is at most borderline dilated. Remote TEE in careeverywhere documented a small interatrial shunt via PFO.  PLAN: Start IV diltiazem for rate control while gradually increasing beta blocker as long term rate control agent. Start on Eliquis. Although she had had serious falls, she is now  in a nursing facility where hopefully the risk of falls can be reduced. Her risk of embolic stroke is very high. Ultimately, if she has further falls, may have to stop anticoagulation.  Sanda Klein, MD, [place] [phone] 09/21/2019, 5:00 PM

## 2019-09-21 NOTE — Progress Notes (Addendum)
PROGRESS NOTE  Victoria Walsh [id number] DOB: [birth year] DOA: 09/18/2019 PCP: Reymundo Poll, MD  HPI/Recap of past 24 hours: Victoria Walsh is a 86 y.o. 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  Due to patient mental status, all information was taken from the son on phone.  Patient has neurogenic bladder from stroke so she has a chronic indwelling Foley catheter.  Facility reported that the catheter stopped draining.  When she presented here she had fever of 102 F, tachycardic.  Foley catheter was very dirty and it was not draining urine.  Her blood pressure was stable on presentation.  She has history of ESBL E. coli UTI. Patient seen and examined at the bedside in the emergency department.  Febrile during my presentation but hemodynamically stable.  There was no report of nausea, vomiting, diarrhea, abdominal pain, chest pain or shortness of breath or cough.  She was unable to provide any history.  She was not in any kind of distress.  Positive UA and urine culture greater than 100,000 colonies of E. coli.  E. coli bacteremia.  She is on meropenem for history of ESBL E. Coli UTI.  09/21/19: A. fib with RVR overnight.  No prior history of A. fib.  She does have a history of CVA and is on aspirin.  2D echo has been ordered and pending.  Cardiology consulted.  Audibly wheezing this morning.  Obtained chest x-ray which showed mild pulmonary edema, personally reviewed. IV fluid DCed and given 20 mg IV lasix once with soft BP.      Assessment/Plan: Principal Problem:   Sepsis ([place]) Active Problems:   Acute lower UTI   AKI (acute kidney injury) ([place])   Dementia with behavioral disturbance ([place])   Ischemic stroke ([place])   Hypothyroidism   Breast  cancer ([place])  Sepsis secondary to recurrent E. coli ESBL bacteremia likely urinary source from E. coli ESBL UTI History of ESBL E. coli UTI. Sepsis criteria is improving Afebrile with no leukocytosis Continue meropenem day #3  New A. fib with RVR TSH normal. CHADSVASC score 5 Cardiology consulted.  Appreciate assistance. 2D echo ordered and pending Started on p.o. Lopressor 12.5 mg twice daily on 09/20/2019 Currently on diltiazem drip per cardiology Defer anticoagulation to cardiology  Resolving AKI likely prerenal in the setting of dehydration from poor oral intake Baseline creatinine appears to be 0.8 with GFR greater than 60 Presented with creatinine of 1.3 with GFR of 37. Creatinine down to 1.0 Continue to avoid nephrotoxins and hypotension Continue to monitor urine output BMPs  Hx of ischemic CVA C/w ASA Lipid panel added on, pending Add low-dose Lipitor 20 mg nightly  Chronic macrocytic anemia Baseline hemoglobin appears to be 11 with MCV of 100. Obtain vitamin B12 and folate level Hemoglobin stable 9.3. No sign of overt bleeding  Dementia Reorient as needed Fall precautions  Hypothyroidism Continue Synthroid Add on TSH  GERD Continue PPI  History of left breast cancer  Continue letrozole   DVT prophylaxis: Lovenox subcu daily. Code Status: Full code Family Communication:  None at bedside. Consults called:  Cardiology.  Disposition: Patient is from SNF.  Anticipate discharge to SNF.  Barrier to discharge ongoing treatment for E. coli ESBL bacteremia on IV antibiotics.  Objective: Vitals:   09/21/19 [id number] 09/21/19 [id number] 09/21/19 0754 09/21/19 1443  BP: 120/76     Pulse: 60     Resp: 18     Temp: 97.7 F (36.5 C)     TempSrc: Oral     SpO2: 97%  91% 92%  Weight:  82.7 kg    Height:        Intake/Output Summary (Last 24 hours) at 09/21/2019 1505 Last data filed at 09/21/2019 1325 Gross per 24 hour  Intake 2492.59 ml  Output 650 ml  Net 1842.59  ml   Filed Weights   09/18/19 1457 09/21/19 0426  Weight: 79.9 kg 82.7 kg    Exam:  . General: 86 y.o. year-old female well-developed and nourished audibly wheezing.  Alert and interactive.   . Cardiovascular: Irregular rate and rhythm no rubs or gallops.  Marland Kitchen  Respiratory: Diffuse wheezing bilaterally.  Mild crackles at bases. . Abdomen: Soft nontender normal bowel sounds present. Musculoskeletal: Trace of extremity edema bilaterally.   Psychiatry: Mood is appropriate for condition and setting. Data Reviewed: CBC: Recent Labs  Lab 09/18/19 1137 09/19/19 0514 09/20/19 0500 09/21/19 0401  WBC 18.7* 17.4* 8.8 6.7  NEUTROABS 16.6*  --  7.5 5.2  HGB 11.8* 9.5* 8.6* 9.3*  HCT 37.7 30.5* 28.1* 30.7*  MCV 99.5 102.3* 104.1* 101.3*  PLT 217 155 132* [id number]*   Basic Metabolic Panel: Recent Labs  Lab 09/18/19 1137 09/19/19 0514 09/20/19 0500 09/20/19 0503 09/21/19 0401  NA 141 142 143  --  143  K 3.9 4.0 3.5  --  3.6  CL 110 113* 117*  --  120*  CO2 19* 22 17*  --  18*  GLUCOSE 143* 123* 99  --  104*  BUN 31* 31* 30*  --  26*  CREATININE 1.35* 1.30* 1.13* 1.05* 1.00  CALCIUM 8.7* 7.9* 7.5*  --  8.0*   GFR: Estimated Creatinine Clearance: 46.4 mL/min (by C-G formula based on SCr of 1 mg/dL). Liver Function Tests: Recent Labs  Lab 09/18/19 1137  AST 19  ALT 16  ALKPHOS 77  BILITOT 1.3*  PROT 6.9  ALBUMIN 3.9   No results for input(s): LIPASE, AMYLASE in the last 168 hours. No results for input(s): AMMONIA in the last 168 hours. Coagulation Profile: Recent Labs  Lab 09/18/19 1137  INR 1.1   Cardiac Enzymes: No results for input(s): CKTOTAL, CKMB, CKMBINDEX, TROPONINI in the last 168 hours. BNP (last 3 results) No results for input(s): PROBNP in the last 8760 hours. HbA1C: No results for input(s): HGBA1C in the last 72 hours. CBG: No results for input(s): GLUCAP in the last 168 hours. Lipid Profile: No results for input(s): CHOL, HDL, LDLCALC, TRIG, CHOLHDL,  LDLDIRECT in the last 72 hours. Thyroid Function Tests: Recent Labs    09/20/19 1555  TSH 1.040   Anemia Panel: No results for input(s): VITAMINB12, FOLATE, FERRITIN, TIBC, IRON, RETICCTPCT in the last 72 hours. Urine analysis:    Component Value Date/Time   COLORURINE YELLOW 09/18/2019 1138   APPEARANCEUR TURBID (A) 09/18/2019 1138   LABSPEC 1.013 09/18/2019 1138   PHURINE 6.0 09/18/2019 1138   GLUCOSEU NEGATIVE 09/18/2019 1138   HGBUR SMALL (A) 09/18/2019 1138   BILIRUBINUR NEGATIVE 09/18/2019 1138   KETONESUR NEGATIVE 09/18/2019 1138   PROTEINUR 100 (A) 09/18/2019 1138   NITRITE POSITIVE (A) 09/18/2019 1138   LEUKOCYTESUR LARGE (A) 09/18/2019 1138   Sepsis Labs: @LABRCNTIP (procalcitonin:4,lacticidven:4)  ) Recent Results (from the past 240 hour(s))  Blood Culture (routine x 2)     Status: Abnormal   Collection  Time: 09/18/19 11:38 AM   Specimen: BLOOD  Result Value Ref Range Status   Specimen Description   Final    BLOOD LEFT ARM Performed at [facility] [address] [place], [place] [address]    Special Requests   Final    BOTTLES DRAWN AEROBIC AND ANAEROBIC Blood Culture adequate volume Performed at [facility] [address] [place], [place] [address]    Culture  Setup Time   Final    GRAM NEGATIVE RODS AEROBIC BOTTLE ONLY CRITICAL RESULT CALLED TO, READ BACK BY AND VERIFIED WITH: [place]. [id number] FCP Performed at [facility], [place] [address] [place], [place] [address]    Culture (A)  Final    ESCHERICHIA COLI Confirmed Extended Spectrum Beta-Lactamase Producer (ESBL).  In bloodstream infections from ESBL organisms, carbapenems are preferred over piperacillin/tazobactam. They are shown to have a lower risk of mortality.    Report Status 09/21/2019 FINAL  Final   Organism ID, Bacteria ESCHERICHIA COLI  Final      Susceptibility   Escherichia coli - MIC*    AMPICILLIN >=32 RESISTANT Resistant      CEFAZOLIN >=64 RESISTANT Resistant     CEFEPIME >=32 RESISTANT Resistant     CEFTAZIDIME RESISTANT Resistant     CEFTRIAXONE >=64 RESISTANT Resistant     CIPROFLOXACIN >=4 RESISTANT Resistant     GENTAMICIN <=1 SENSITIVE Sensitive     IMIPENEM <=0.25 SENSITIVE Sensitive     TRIMETH/SULFA >=320 RESISTANT Resistant     AMPICILLIN/SULBACTAM >=32 RESISTANT Resistant     PIP/TAZO <=4 SENSITIVE Sensitive     * ESCHERICHIA COLI  Blood Culture (routine x 2)     Status: None (Preliminary result)   Collection Time: 09/18/19 11:38 AM   Specimen: BLOOD  Result Value Ref Range Status   Specimen Description   Final    BLOOD RIGHT ARM Performed at [facility] [address] [place], [place] [address]    Special Requests   Final    BOTTLES DRAWN AEROBIC ONLY Blood Culture results may not be optimal due to an inadequate volume of blood received in culture bottles Performed at [facility] [address] [place], [place] [address]    Culture   Final    NO GROWTH 3 DAYS Performed at [facility], [place] [address] [place], [place] [address]    Report Status PENDING  Incomplete  Urine culture     Status: Abnormal   Collection Time: 09/18/19 11:38 AM   Specimen: In/Out Cath Urine  Result Value Ref Range Status   Specimen Description   Final    IN/OUT CATH URINE Performed at [facility] [address], [place], [place] [address]    Special Requests   Final    NONE Performed at [facility], [place] [address] [place], [place] [address]    Culture (A)  Final    >=100,000 COLONIES/mL ESCHERICHIA COLI Confirmed Extended Spectrum Beta-Lactamase Producer (ESBL).  In bloodstream infections from ESBL organisms, carbapenems are preferred over piperacillin/tazobactam. They are shown to have a lower risk of mortality.    Report Status 09/20/2019 FINAL  Final   Organism ID, Bacteria ESCHERICHIA COLI (A)  Final       Susceptibility   Escherichia coli - MIC*    AMPICILLIN >=32 RESISTANT Resistant     CEFAZOLIN >=64 RESISTANT Resistant     CEFTRIAXONE >=64 RESISTANT Resistant  CIPROFLOXACIN >=4 RESISTANT Resistant     GENTAMICIN <=1 SENSITIVE Sensitive     IMIPENEM <=0.25 SENSITIVE Sensitive     NITROFURANTOIN <=16 SENSITIVE Sensitive     TRIMETH/SULFA >=320 RESISTANT Resistant     AMPICILLIN/SULBACTAM >=32 RESISTANT Resistant     PIP/TAZO <=4 SENSITIVE Sensitive     * >=100,000 COLONIES/mL ESCHERICHIA COLI  Blood Culture ID Panel (Reflexed)     Status: Abnormal   Collection Time: 09/18/19 11:38 AM  Result Value Ref Range Status   Enterococcus species NOT DETECTED NOT DETECTED Final   Listeria monocytogenes NOT DETECTED NOT DETECTED Final   Staphylococcus species NOT DETECTED NOT DETECTED Final   Staphylococcus aureus (BCID) NOT DETECTED NOT DETECTED Final   Streptococcus species NOT DETECTED NOT DETECTED Final   Streptococcus agalactiae NOT DETECTED NOT DETECTED Final   Streptococcus pneumoniae NOT DETECTED NOT DETECTED Final   Streptococcus pyogenes NOT DETECTED NOT DETECTED Final   Acinetobacter baumannii NOT DETECTED NOT DETECTED Final   Enterobacteriaceae species DETECTED (A) NOT DETECTED Final    Comment: Enterobacteriaceae represent a large family of gram-negative bacteria, not a single organism. CRITICAL RESULT CALLED TO, READ BACK BY AND VERIFIED WITH: PHARMD DREW WOFFORD PO:[phone] FCP    Enterobacter cloacae complex NOT DETECTED NOT DETECTED Final   Escherichia coli DETECTED (A) NOT DETECTED Final    Comment: CRITICAL RESULT CALLED TO, READ BACK BY AND VERIFIED WITH: PHARMD DREW WOFFORD PO:[phone] FCP    Klebsiella oxytoca NOT DETECTED NOT DETECTED Final   Klebsiella pneumoniae NOT DETECTED NOT DETECTED Final   Proteus species NOT DETECTED NOT DETECTED Final   Serratia marcescens NOT DETECTED NOT DETECTED Final   Carbapenem resistance NOT DETECTED NOT DETECTED Final    Haemophilus influenzae NOT DETECTED NOT DETECTED Final   Neisseria meningitidis NOT DETECTED NOT DETECTED Final   Pseudomonas aeruginosa NOT DETECTED NOT DETECTED Final   Candida albicans NOT DETECTED NOT DETECTED Final   Candida glabrata NOT DETECTED NOT DETECTED Final   Candida krusei NOT DETECTED NOT DETECTED Final   Candida parapsilosis NOT DETECTED NOT DETECTED Final   Candida tropicalis NOT DETECTED NOT DETECTED Final    Comment: Performed at [facility], [place] [address] [place], [place] [address]  Respiratory Panel by RT PCR (Flu A&B, Covid) - Nasopharyngeal Swab     Status: None   Collection Time: 09/18/19  1:20 PM   Specimen: Nasopharyngeal Swab  Result Value Ref Range Status   SARS Coronavirus 2 by RT PCR NEGATIVE NEGATIVE Final    Comment: (NOTE) SARS-CoV-2 target nucleic acids are NOT DETECTED. The SARS-CoV-2 RNA is generally detectable in upper respiratoy specimens during the acute phase of infection. The lowest concentration of SARS-CoV-2 viral copies this assay can detect is 131 copies/mL. A negative result does not preclude SARS-Cov-2 infection and should not be used as the sole basis for treatment or other patient management decisions. A negative result may occur with  improper specimen collection/handling, submission of specimen other than nasopharyngeal swab, presence of viral mutation(s) within the areas targeted by this assay, and inadequate number of viral copies (<131 copies/mL). A negative result must be combined with clinical observations, patient history, and epidemiological information. The expected result is Negative. Fact Sheet for Patients:  PinkCheek.be Fact Sheet for Healthcare Providers:  GravelBags.it This test is not yet ap proved or cleared by the [place] FDA and  has been authorized for detection and/or diagnosis of SARS-CoV-2 by FDA under an Emergency Use Authorization (EUA).  This EUA will remain  in effect (meaning this test can be used) for the duration of the COVID-19 declaration under Section 564(b)(1) of the Act, 21 U.S.C. section 360bbb-3(b)(1), unless the authorization is terminated or revoked sooner.    Influenza A by PCR NEGATIVE NEGATIVE Final   Influenza B by PCR NEGATIVE NEGATIVE Final    Comment: (NOTE) The Xpert Xpress SARS-CoV-2/FLU/RSV assay is intended as an aid in  the diagnosis of influenza from Nasopharyngeal swab specimens and  should not be used as a sole basis for treatment. Nasal washings and  aspirates are unacceptable for Xpert Xpress SARS-CoV-2/FLU/RSV  testing. Fact Sheet for Patients: PinkCheek.be Fact Sheet for Healthcare Providers: GravelBags.it This test is not yet approved or cleared by the [place] FDA and  has been authorized for detection and/or diagnosis of SARS-CoV-2 by  FDA under an Emergency Use Authorization (EUA). This EUA will remain  in effect (meaning this test can be used) for the duration of the  Covid-19 declaration under Section 564(b)(1) of the Act, 21  U.S.C. section 360bbb-3(b)(1), unless the authorization is  terminated or revoked. Performed at [facility], [place] [address], [place], [place] [address]   Culture, blood (routine x 2)     Status: None (Preliminary result)   Collection Time: 09/20/19  5:03 AM   Specimen: BLOOD  Result Value Ref Range Status   Specimen Description   Final    BLOOD RIGHT ANTECUBITAL Performed at [facility] [address], [place], [place] [address]    Special Requests   Final    BOTTLES DRAWN AEROBIC AND ANAEROBIC Blood Culture adequate volume Performed at [facility] [address], [place], [place] [address]    Culture   Final    NO GROWTH 1 DAY Performed at [facility], [place] [address], [place], [place] [address]    Report Status  PENDING  Incomplete  Culture, blood (routine x 2)     Status: None (Preliminary result)   Collection Time: 09/20/19  5:03 AM   Specimen: BLOOD RIGHT HAND  Result Value Ref Range Status   Specimen Description   Final    BLOOD RIGHT HAND Performed at [facility] [address] [place], [place] [address]    Special Requests   Final    BOTTLES DRAWN AEROBIC ONLY Blood Culture adequate volume Performed at [facility] [address] [place], [place] [address]    Culture   Final    NO GROWTH 1 DAY Performed at [facility], [place] [address] [place], [place] [address]    Report Status PENDING  Incomplete      Studies: DG CHEST PORT 1 VIEW  Result Date: 09/21/2019 CLINICAL DATA:  Wheezing, dementia EXAM: PORTABLE CHEST 1 VIEW COMPARISON:  09/18/2019 FINDINGS: Single frontal view of the chest demonstrates a stable cardiac silhouette. Increasing interstitial prominence with [place] B-lines consistent with mild interstitial edema. No airspace disease, effusion, or pneumothorax. IMPRESSION: 1. Mild interstitial edema. Electronically Signed   By: Randa Ngo M.D.   On: 09/21/2019 08:53    Scheduled Meds: . aspirin EC  81 mg Oral Daily  . brimonidine  1 drop Left Eye Q12H   And  . timolol  1 drop Left Eye Q12H  . brinzolamide  1 drop Left Eye BID  . Chlorhexidine Gluconate Cloth  6 each Topical Daily  . docusate sodium  100 mg Oral Daily  . DULoxetine  20 mg Oral QHS  . enoxaparin (LOVENOX) injection  40 mg Subcutaneous Q24H  . ipratropium  0.5 mg Nebulization Q6H  . latanoprost  1 drop Left Eye QHS  . letrozole  2.5 mg Oral Daily  . levalbuterol  0.63 mg Nebulization Q6H  . levothyroxine  100 mcg Oral QAC breakfast  . metoprolol tartrate  12.5 mg Oral BID  . pantoprazole  40 mg Oral Daily  . pregabalin  25 mg Oral QHS  . psyllium  1 packet Oral QHS    Continuous Infusions: . diltiazem (CARDIZEM) infusion 5 mg/hr (09/21/19 1501)  .  meropenem (MERREM) IV 1 g (09/21/19 1325)     LOS: 3 days     Kayleen Memos, MD [facility] Pager [phone]  If 7PM-7AM, please contact night-coverage [email or web address] Password [email or web address] 09/21/2019, 3:05 PM

## 2019-09-21 NOTE — Progress Notes (Signed)
  Echocardiogram 2D Echocardiogram has been performed.  Ethelwyn Gilbertson G Chenae Brager 09/21/2019, 4:20 PM

## 2019-09-22 DIAGNOSIS — I5032 Chronic diastolic (congestive) heart failure: Secondary | ICD-10-CM

## 2019-09-22 DIAGNOSIS — I10 Essential (primary) hypertension: Secondary | ICD-10-CM

## 2019-09-22 DIAGNOSIS — I4891 Unspecified atrial fibrillation: Secondary | ICD-10-CM

## 2019-09-22 DIAGNOSIS — I2510 Atherosclerotic heart disease of native coronary artery without angina pectoris: Secondary | ICD-10-CM

## 2019-09-22 DIAGNOSIS — I639 Cerebral infarction, unspecified: Secondary | ICD-10-CM

## 2019-09-22 LAB — BASIC METABOLIC PANEL
Anion gap: 15 (ref 5–15)
Anion gap: 9 (ref 5–15)
BUN: 25 mg/dL — ABNORMAL HIGH (ref 8–23)
BUN: 27 mg/dL — ABNORMAL HIGH (ref 8–23)
CO2: 18 mmol/L — ABNORMAL LOW (ref 22–32)
CO2: 21 mmol/L — ABNORMAL LOW (ref 22–32)
Calcium: 7.8 mg/dL — ABNORMAL LOW (ref 8.9–10.3)
Calcium: 8.1 mg/dL — ABNORMAL LOW (ref 8.9–10.3)
Chloride: 110 mmol/L (ref 98–111)
Chloride: 111 mmol/L (ref 98–111)
Creatinine, Ser: 0.89 mg/dL (ref 0.44–1.00)
Creatinine, Ser: 0.91 mg/dL (ref 0.44–1.00)
GFR calc Af Amer: >60 mL/min (ref >60)
GFR calc Af Amer: >60 mL/min (ref >60)
GFR calc non Af Amer: 59 mL/min — ABNORMAL LOW (ref >60)
GFR calc non Af Amer: 58 mL/min — ABNORMAL LOW (ref >60)
Glucose, Bld: 110 mg/dL — ABNORMAL HIGH (ref 70–99)
Glucose, Bld: 119 mg/dL — ABNORMAL HIGH (ref 70–99)
Potassium: 3.2 mmol/L — ABNORMAL LOW (ref 3.5–5.1)
Potassium: 3.3 mmol/L — ABNORMAL LOW (ref 3.5–5.1)
Sodium: 143 mmol/L (ref 135–145)
Sodium: 141 mmol/L (ref 135–145)

## 2019-09-22 LAB — MAGNESIUM: Magnesium: 1.8 mg/dL (ref 1.7–2.4)

## 2019-09-22 LAB — CBC WITH DIFFERENTIAL/PLATELET
Abs Immature Granulocytes: 0.03 10*3/uL (ref 0.00–0.07)
Basophils Absolute: 0 10*3/uL (ref 0.0–0.1)
Basophils Relative: 0 %
Eosinophils Absolute: 0.1 10*3/uL (ref 0.0–0.5)
Eosinophils Relative: 1 %
HCT: 30.8 % — ABNORMAL LOW (ref 36.0–46.0)
Hemoglobin: 9.7 g/dL — ABNORMAL LOW (ref 12.0–15.0)
Immature Granulocytes: 0 %
Lymphocytes Relative: 12 %
Lymphs Abs: 1.1 10*3/uL (ref 0.7–4.0)
MCH: 31.7 pg (ref 26.0–34.0)
MCHC: 31.5 g/dL (ref 30.0–36.0)
MCV: 100.7 fL — ABNORMAL HIGH (ref 80.0–100.0)
Monocytes Absolute: 0.8 10*3/uL (ref 0.1–1.0)
Monocytes Relative: 9 %
Neutro Abs: 6.9 10*3/uL (ref 1.7–7.7)
Neutrophils Relative %: 78 %
Platelets: 133 10*3/uL — ABNORMAL LOW (ref 150–400)
RBC: 3.06 MIL/uL — ABNORMAL LOW (ref 3.87–5.11)
RDW: 13.5 % (ref 11.5–15.5)
WBC: 8.9 10*3/uL (ref 4.0–10.5)
nRBC: 0 % (ref 0.0–0.2)

## 2019-09-22 LAB — PHOSPHORUS: Phosphorus: 2.3 mg/dL — ABNORMAL LOW (ref 2.5–4.6)

## 2019-09-22 MED ORDER — SODIUM CHLORIDE 0.9 % IV SOLN
1.0000 g | Freq: Three times a day (TID) | INTRAVENOUS | Status: AC
  Administered 2019-09-22 – 2019-09-25 (×10): 1 g via INTRAVENOUS
  Filled 2019-09-22 (×10): qty 1

## 2019-09-22 MED ORDER — DILTIAZEM HCL 30 MG PO TABS
30.0000 mg | ORAL_TABLET | Freq: Four times a day (QID) | ORAL | Status: DC
  Administered 2019-09-22 – 2019-09-23 (×4): 30 mg via ORAL
  Filled 2019-09-22 (×4): qty 1

## 2019-09-22 MED ORDER — IPRATROPIUM-ALBUTEROL 0.5-2.5 (3) MG/3ML IN SOLN
3.0000 mL | Freq: Two times a day (BID) | RESPIRATORY_TRACT | Status: DC
  Administered 2019-09-22 – 2019-09-23 (×3): 3 mL via RESPIRATORY_TRACT
  Filled 2019-09-22 (×3): qty 3

## 2019-09-22 NOTE — Progress Notes (Signed)
PROGRESS NOTE  Victoria Walsh [id number] DOB: 10/04/1931 DOA: 09/18/2019 PCP: Reymundo Poll, MD  HPI/Recap of past 24 hours: Victoria Walsh is a 86 y.o. 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  Due to patient mental status, all information was taken from the son on phone.  Patient has neurogenic bladder from stroke so she has a chronic indwelling Foley catheter.  Facility reported that the catheter stopped draining.  When she presented here she had fever of 102 F, tachycardic.  Foley catheter was very dirty and it was not draining urine.  Her blood pressure was stable on presentation.  She has history of ESBL E. coli UTI.  Presentation to the ED positive UA and urine culture greater than 100,000 colonies of ESBL E. coli.  ESBL E. coli bacteremia.  She is on meropenem.  Hospital course complicated by A. fib with RVR on 09/21/2019.  No prior history of A. fib.  Cardiology consulted.  Was placed on diltiazem drip with improvement of heart rate.  2D echo showed normal LVEF.  09/22/19: Seen and examined.  Looks improved clinically.  She denies any pain or dyspnea.  She has no new complaints.     Assessment/Plan: Principal Problem:   Sepsis ([place]) Active Problems:   Acute lower UTI   AKI (acute kidney injury) ([place])   Dementia with behavioral disturbance (HCC)   Ischemic stroke ([place])   Hypothyroidism   Breast cancer ([place])   Atrial fibrillation with RVR (HCC)   Chronic diastolic CHF (congestive heart failure) (HCC)   Coronary artery disease involving native coronary artery of native heart without angina pectoris   Essential hypertension  Sepsis secondary to recurrent ESBL E. Coli bacteremia likely urinary source from ESBL E. coli UTI History of ESBL  E. coli UTI. Sepsis criteria is improving Afebrile with no leukocytosis Continue meropenem day #3  New A. fib with RVR TSH normal. CHADSVASC score 6 Cardiology following. Now on Eliquis 5 mg twice daily. 2D echo showed normal LVEF. Continue Lopressor 12.5 mg twice daily, p.o. Cardizem 30 mg every 6 hours. Continue to closely monitor vital signs  Resolving AKI likely prerenal in the setting of dehydration from poor oral intake Baseline creatinine appears to be 0.8 with GFR greater than 60 Presented with creatinine of 1.3 with GFR of 37. Creatinine down to 0.9 Continue to avoid nephrotoxins and hypotension Continue to monitor urine output BMPs  Hx of ischemic CVA C/w ASA LDL 68 Continue Lipitor 20 mg nightly  Chronic macrocytic anemia Baseline hemoglobin appears to be 11 with MCV of 100. Obtain vitamin B12 and folate level Hemoglobin stable 9.3. No sign of overt bleeding  Dementia Reorient as needed Fall precautions  Hypothyroidism Continue Synthroid Tsh normal  GERD Continue PPI  History of left breast cancer  Continue letrozole   DVT prophylaxis: Lovenox subcu daily. Code Status: Full code Family Communication:  None at bedside. Consults called:  Cardiology.  Disposition: Patient is from SNF.  Anticipate discharge to SNF.  Barrier to discharge ongoing treatment for E. coli ESBL bacteremia on IV antibiotics.  Objective: Vitals:   09/21/19 1955 09/21/19 2123 09/22/19 0439 09/22/19 1250  BP: 111/61  121/70 (!) 114/56  Pulse: 73  76 79  Resp: 18  18 (!) 21  Temp: 98 F (36.7 C)  98.4 F (36.9  C) (!) 97.5 F (36.4 C)  TempSrc: Oral  Oral Oral  SpO2: 99% 99% 96% 99%  Weight:      Height:        Intake/Output Summary (Last 24 hours) at 09/22/2019 1516 Last data filed at 09/22/2019 0855 Gross per 24 hour  Intake 514.99 ml  Output 1250 ml  Net -735.01 ml   Filed Weights   09/18/19 1457 09/21/19 0426  Weight: 79.9 kg 82.7 kg    Exam:  . General:  86 y.o. year-old female well-developed/nourished no acute distress.  Alert and interactive.  Pleasantly demented. . Cardiovascular: Irregular rate and rhythm no rubs or gallops. Marland Kitchen  Respiratory: Mild rales at bases no wheezing noted.   Abdomen: Obese nontender normal bowel sounds present.   Musculoskeletal: Trace lower extremity edema bilaterally.   Psychiatry: Mood is appropriate for condition and setting.     Data Reviewed: CBC: Recent Labs  Lab 09/18/19 1137 09/19/19 0514 09/20/19 0500 09/21/19 0401 09/22/19 0522  WBC 18.7* 17.4* 8.8 6.7 8.9  NEUTROABS 16.6*  --  7.5 5.2 6.9  HGB 11.8* 9.5* 8.6* 9.3* 9.7*  HCT 37.7 30.5* 28.1* 30.7* 30.8*  MCV 99.5 102.3* 104.1* 101.3* 100.7*  PLT 217 155 132* 136* [id number]*   Basic Metabolic Panel: Recent Labs  Lab 09/19/19 0514 09/19/19 0514 09/20/19 0500 09/20/19 0503 09/21/19 0401 09/22/19 0522 09/22/19 1052  NA 142  --  143  --  143 143 141  K 4.0  --  3.5  --  3.6 3.2* 3.3*  CL 113*  --  117*  --  120* 110 111  CO2 22  --  17*  --  18* 18* 21*  GLUCOSE 123*  --  99  --  104* 110* 119*  BUN 31*  --  30*  --  26* 25* 27*  CREATININE 1.30*   < > 1.13* 1.05* 1.00 0.89 0.91  CALCIUM 7.9*  --  7.5*  --  8.0* 7.8* 8.1*  MG  --   --   --   --   --  1.8  --   PHOS  --   --   --   --   --  2.3*  --    < > = values in this interval not displayed.   GFR: Estimated Creatinine Clearance: 50.9 mL/min (by C-G formula based on SCr of 0.91 mg/dL). Liver Function Tests: Recent Labs  Lab 09/18/19 1137  AST 19  ALT 16  ALKPHOS 77  BILITOT 1.3*  PROT 6.9  ALBUMIN 3.9   No results for input(s): LIPASE, AMYLASE in the last 168 hours. No results for input(s): AMMONIA in the last 168 hours. Coagulation Profile: Recent Labs  Lab 09/18/19 1137  INR 1.1   Cardiac Enzymes: No results for input(s): CKTOTAL, CKMB, CKMBINDEX, TROPONINI in the last 168 hours. BNP (last 3 results) No results for input(s): PROBNP in the last 8760  hours. HbA1C: No results for input(s): HGBA1C in the last 72 hours. CBG: No results for input(s): GLUCAP in the last 168 hours. Lipid Profile: Recent Labs    09/21/19 1607  CHOL 123  HDL 20*  LDLCALC 68  TRIG 173*  CHOLHDL 6.2   Thyroid Function Tests: Recent Labs    09/20/19 1555  TSH 1.040   Anemia Panel: Recent Labs    09/21/19 1607  VITAMINB12 275   Urine analysis:    Component Value Date/Time   COLORURINE YELLOW 09/18/2019 1138   APPEARANCEUR TURBID (A) 09/18/2019  1138   LABSPEC 1.013 09/18/2019 1138   PHURINE 6.0 09/18/2019 1138   GLUCOSEU NEGATIVE 09/18/2019 1138   HGBUR SMALL (A) 09/18/2019 1138   BILIRUBINUR NEGATIVE 09/18/2019 1138   [place] 09/18/2019 1138   PROTEINUR 100 (A) 09/18/2019 1138   NITRITE POSITIVE (A) 09/18/2019 1138   LEUKOCYTESUR LARGE (A) 09/18/2019 1138   Sepsis Labs: @LABRCNTIP (procalcitonin:4,lacticidven:4)  ) Recent Results (from the past 240 hour(s))  Blood Culture (routine x 2)     Status: Abnormal   Collection Time: 09/18/19 11:38 AM   Specimen: BLOOD  Result Value Ref Range Status   Specimen Description   Final    BLOOD LEFT ARM Performed at [facility], [place] [address], [place], [place] [address]    Special Requests   Final    BOTTLES DRAWN AEROBIC AND ANAEROBIC Blood Culture adequate volume Performed at [facility] [address], [place], [place] [address]    Culture  Setup Time   Final    GRAM NEGATIVE RODS AEROBIC BOTTLE ONLY CRITICAL RESULT CALLED TO, READ BACK BY AND VERIFIED WITH: [place]. UQ:[id number] FCP Performed at [facility], [place] [address], [place], [place] [address]    Culture (A)  Final    ESCHERICHIA COLI Confirmed Extended Spectrum Beta-Lactamase Producer (ESBL).  In bloodstream infections from ESBL organisms, carbapenems are preferred over piperacillin/tazobactam. They are shown to have a lower risk of mortality.    Report Status  09/21/2019 FINAL  Final   Organism ID, Bacteria ESCHERICHIA COLI  Final      Susceptibility   Escherichia coli - MIC*    AMPICILLIN >=32 RESISTANT Resistant     CEFAZOLIN >=64 RESISTANT Resistant     CEFEPIME >=32 RESISTANT Resistant     CEFTAZIDIME RESISTANT Resistant     CEFTRIAXONE >=64 RESISTANT Resistant     CIPROFLOXACIN >=4 RESISTANT Resistant     GENTAMICIN <=1 SENSITIVE Sensitive     IMIPENEM <=0.25 SENSITIVE Sensitive     TRIMETH/SULFA >=320 RESISTANT Resistant     AMPICILLIN/SULBACTAM >=32 RESISTANT Resistant     PIP/TAZO <=4 SENSITIVE Sensitive     * ESCHERICHIA COLI  Blood Culture (routine x 2)     Status: None (Preliminary result)   Collection Time: 09/18/19 11:38 AM   Specimen: BLOOD  Result Value Ref Range Status   Specimen Description   Final    BLOOD RIGHT ARM Performed at [facility] [address] [place], [place] [address]    Special Requests   Final    BOTTLES DRAWN AEROBIC ONLY Blood Culture results may not be optimal due to an inadequate volume of blood received in culture bottles Performed at [facility] [address], [place], [place] [address]    Culture   Final    NO GROWTH 4 DAYS Performed at [facility], [place] [address] [place], [place] [address]    Report Status PENDING  Incomplete  Urine culture     Status: Abnormal   Collection Time: 09/18/19 11:38 AM   Specimen: In/Out Cath Urine  Result Value Ref Range Status   Specimen Description   Final    IN/OUT CATH URINE Performed at [facility] [address] [place],  [address]    Special Requests   Final    NONE Performed at [facility], [place] [address], [place],  [address]    Culture (A)  Final    >=  100,000 COLONIES/mL ESCHERICHIA COLI Confirmed Extended Spectrum Beta-Lactamase Producer (ESBL).  In bloodstream infections from ESBL organisms, carbapenems are preferred over  piperacillin/tazobactam. They are shown to have a lower risk of mortality.    Report Status 09/20/2019 FINAL  Final   Organism ID, Bacteria ESCHERICHIA COLI (A)  Final      Susceptibility   Escherichia coli - MIC*    AMPICILLIN >=32 RESISTANT Resistant     CEFAZOLIN >=64 RESISTANT Resistant     CEFTRIAXONE >=64 RESISTANT Resistant     CIPROFLOXACIN >=4 RESISTANT Resistant     GENTAMICIN <=1 SENSITIVE Sensitive     IMIPENEM <=0.25 SENSITIVE Sensitive     NITROFURANTOIN <=16 SENSITIVE Sensitive     TRIMETH/SULFA >=320 RESISTANT Resistant     AMPICILLIN/SULBACTAM >=32 RESISTANT Resistant     PIP/TAZO <=4 SENSITIVE Sensitive     * >=100,000 COLONIES/mL ESCHERICHIA COLI  Blood Culture ID Panel (Reflexed)     Status: Abnormal   Collection Time: 09/18/19 11:38 AM  Result Value Ref Range Status   Enterococcus species NOT DETECTED NOT DETECTED Final   Listeria monocytogenes NOT DETECTED NOT DETECTED Final   Staphylococcus species NOT DETECTED NOT DETECTED Final   Staphylococcus aureus (BCID) NOT DETECTED NOT DETECTED Final   Streptococcus species NOT DETECTED NOT DETECTED Final   Streptococcus agalactiae NOT DETECTED NOT DETECTED Final   Streptococcus pneumoniae NOT DETECTED NOT DETECTED Final   Streptococcus pyogenes NOT DETECTED NOT DETECTED Final   Acinetobacter baumannii NOT DETECTED NOT DETECTED Final   Enterobacteriaceae species DETECTED (A) NOT DETECTED Final    Comment: Enterobacteriaceae represent a large family of gram-negative bacteria, not a single organism. CRITICAL RESULT CALLED TO, READ BACK BY AND VERIFIED WITH: PHARMD DREW WOFFORD PO:[id number] FCP    Enterobacter cloacae complex NOT DETECTED NOT DETECTED Final   Escherichia coli DETECTED (A) NOT DETECTED Final    Comment: CRITICAL RESULT CALLED TO, READ BACK BY AND VERIFIED WITH: PHARMD DREW WOFFORD PO:[id number] FCP    Klebsiella oxytoca NOT DETECTED NOT DETECTED Final   Klebsiella pneumoniae NOT DETECTED NOT DETECTED  Final   Proteus species NOT DETECTED NOT DETECTED Final   Serratia marcescens NOT DETECTED NOT DETECTED Final   Carbapenem resistance NOT DETECTED NOT DETECTED Final   Haemophilus influenzae NOT DETECTED NOT DETECTED Final   Neisseria meningitidis NOT DETECTED NOT DETECTED Final   Pseudomonas aeruginosa NOT DETECTED NOT DETECTED Final   Candida albicans NOT DETECTED NOT DETECTED Final   Candida glabrata NOT DETECTED NOT DETECTED Final   Candida krusei NOT DETECTED NOT DETECTED Final   Candida parapsilosis NOT DETECTED NOT DETECTED Final   Candida tropicalis NOT DETECTED NOT DETECTED Final    Comment: Performed at [facility], [place] [address], [place], [place] [address]  Respiratory Panel by RT PCR (Flu A&B, Covid) - Nasopharyngeal Swab     Status: None   Collection Time: 09/18/19  1:20 PM   Specimen: Nasopharyngeal Swab  Result Value Ref Range Status   SARS Coronavirus 2 by RT PCR NEGATIVE NEGATIVE Final    Comment: (NOTE) SARS-CoV-2 target nucleic acids are NOT DETECTED. The SARS-CoV-2 RNA is generally detectable in upper respiratoy specimens during the acute phase of infection. The lowest concentration of SARS-CoV-2 viral copies this assay can detect is 131 copies/mL. A negative result does not preclude SARS-Cov-2 infection and should not be used as the sole basis for treatment or other patient management decisions. A negative result may occur with  improper specimen collection/handling,  submission of specimen other than nasopharyngeal swab, presence of viral mutation(s) within the areas targeted by this assay, and inadequate number of viral copies (<131 copies/mL). A negative result must be combined with clinical observations, patient history, and epidemiological information. The expected result is Negative. Fact Sheet for Patients:  PinkCheek.be Fact Sheet for Healthcare Providers:  GravelBags.it This test is not  yet ap proved or cleared by the [place] FDA and  has been authorized for detection and/or diagnosis of SARS-CoV-2 by FDA under an Emergency Use Authorization (EUA). This EUA will remain  in effect (meaning this test can be used) for the duration of the COVID-19 declaration under Section 564(b)(1) of the Act, 21 U.S.C. section 360bbb-3(b)(1), unless the authorization is terminated or revoked sooner.    Influenza A by PCR NEGATIVE NEGATIVE Final   Influenza B by PCR NEGATIVE NEGATIVE Final    Comment: (NOTE) The Xpert Xpress SARS-CoV-2/FLU/RSV assay is intended as an aid in  the diagnosis of influenza from Nasopharyngeal swab specimens and  should not be used as a sole basis for treatment. Nasal washings and  aspirates are unacceptable for Xpert Xpress SARS-CoV-2/FLU/RSV  testing. Fact Sheet for Patients: PinkCheek.be Fact Sheet for Healthcare Providers: GravelBags.it This test is not yet approved or cleared by the [place] FDA and  has been authorized for detection and/or diagnosis of SARS-CoV-2 by  FDA under an Emergency Use Authorization (EUA). This EUA will remain  in effect (meaning this test can be used) for the duration of the  Covid-19 declaration under Section 564(b)(1) of the Act, 21  U.S.C. section 360bbb-3(b)(1), unless the authorization is  terminated or revoked. Performed at [facility], [place] [address], [place], [place] [address]   Culture, blood (routine x 2)     Status: None (Preliminary result)   Collection Time: 09/20/19  5:03 AM   Specimen: BLOOD  Result Value Ref Range Status   Specimen Description   Final    BLOOD RIGHT ANTECUBITAL Performed at [facility] [address] [place], [place] [address]    Special Requests   Final    BOTTLES DRAWN AEROBIC AND ANAEROBIC Blood Culture adequate volume Performed at [facility]  [address] [place], [place] [address]    Culture   Final    NO GROWTH 2 DAYS Performed at [facility] [address] [place], [place] [address]    Report Status PENDING  Incomplete  Culture, blood (routine x 2)     Status: None (Preliminary result)   Collection Time: 09/20/19  5:03 AM   Specimen: BLOOD RIGHT HAND  Result Value Ref Range Status   Specimen Description   Final    BLOOD RIGHT HAND Performed at [facility] [address] [place], [place] [address]    Special Requests   Final    BOTTLES DRAWN AEROBIC ONLY Blood Culture adequate volume Performed at [facility] [address] [place], [place] [address]    Culture   Final    NO GROWTH 2 DAYS Performed at [facility] [address], [place], [place] [address]    Report Status PENDING  Incomplete      Studies: ECHOCARDIOGRAM COMPLETE  Result Date: 09/21/2019   ECHOCARDIOGRAM REPORT   Patient Name:   Victoria Walsh Date of Exam: 09/21/2019 Medical Rec #:  [id number]   Height:       68.0 in Accession #:  [id number]  Weight:       182.3 lb Date of Birth:  Apr 28, 1932   BSA:          1.96 m Patient Age:    23 years    BP:           108/52 mmHg Patient Gender: F           HR:           105 bpm. Exam Location:  Inpatient Procedure: 2D Echo, Cardiac Doppler and Color Doppler Indications:    I48.0 Paroxysmal atrial fibrillation  History:        Patient has no prior history of Echocardiogram examinations.                 Stroke; Risk Factors:Dyslipidemia. CKD.  Sonographer:    Jonelle Sidle Dance Referring Phys: [id number] [place]  1. Left ventricular ejection fraction, by visual estimation, is 55 to 60%. The left ventricle has normal function. There is no left ventricular hypertrophy.  2. Left ventricular diastolic parameters are indeterminate due to atrial fibrillation.  3. The left ventricle has no regional wall motion abnormalities.  4. Global right ventricle has normal  systolic function.The right ventricular size is normal. No increase in right ventricular wall thickness.  5. Left atrial size was normal.  6. Right atrial size was normal.  7. Mild mitral annular calcification.  8. The mitral valve is normal in structure. Mild mitral valve regurgitation. No evidence of mitral stenosis.  9. The tricuspid valve is normal in structure. Tricuspid valve regurgitation is not demonstrated. 10. The aortic valve is normal in structure. Aortic valve regurgitation is not visualized. Mild to moderate aortic valve stenosis. 11. The pulmonic valve was normal in structure. Pulmonic valve regurgitation is not visualized. 12. Normal pulmonary artery systolic pressure. 13. The inferior vena cava is normal in size with greater than 50% respiratory variability, suggesting right atrial pressure of 3 mmHg. FINDINGS  Left Ventricle: Left ventricular ejection fraction, by visual estimation, is 55 to 60%. The left ventricle has normal function. The left ventricle has no regional wall motion abnormalities. There is no left ventricular hypertrophy. Left ventricular diastolic parameters are indeterminate. Normal left atrial pressure. Right Ventricle: The right ventricular size is normal. No increase in right ventricular wall thickness. Global RV systolic function is has normal systolic function. The tricuspid regurgitant velocity is 2.10 m/s, and with an assumed right atrial pressure  of 3 mmHg, the estimated right ventricular systolic pressure is normal at 20.6 mmHg. Left Atrium: Left atrial size was normal in size. Right Atrium: Right atrial size was normal in size Pericardium: There is no evidence of pericardial effusion. Mitral Valve: The mitral valve is normal in structure. Mild mitral annular calcification. Mild mitral valve regurgitation. No evidence of mitral valve stenosis by observation. Tricuspid Valve: The tricuspid valve is normal in structure. Tricuspid valve regurgitation is not demonstrated.  Aortic Valve: The aortic valve is normal in structure.. There is moderate thickening and mild calcification of the aortic valve. Aortic valve regurgitation is not visualized. Aortic regurgitation PHT measures 514 msec. Mild to moderate aortic stenosis is  present. There is moderate thickening of the aortic valve. There is mild calcification of the aortic valve. Pulmonic Valve: The pulmonic valve was normal in structure. Pulmonic valve regurgitation is not visualized. Pulmonic regurgitation is not visualized. Aorta: The aortic root, ascending aorta and aortic arch are all structurally normal, with no evidence of dilitation or obstruction. Venous: The inferior  vena cava is normal in size with greater than 50% respiratory variability, suggesting right atrial pressure of 3 mmHg. IAS/Shunts: No atrial level shunt detected by color flow Doppler. There is no evidence of a patent foramen ovale. No ventricular septal defect is seen or detected. There is no evidence of an atrial septal defect.  LEFT VENTRICLE PLAX 2D LVIDd:         3.80 cm LVIDs:         3.10 cm LV PW:         1.20 cm LV IVS:        1.00 cm LVOT diam:     1.80 cm LV SV:         24 ml LV SV Index:   11.96 LVOT Area:     2.54 cm  RIGHT VENTRICLE             IVC RV Basal diam:  2.80 cm     IVC diam: 1.70 cm RV S prime:     10.30 cm/s TAPSE (M-mode): 1.9 cm LEFT ATRIUM             Index       RIGHT ATRIUM           Index LA diam:        3.40 cm 1.73 cm/m  RA Area:     12.00 cm LA Vol (A2C):   41.6 ml 21.17 ml/m RA Volume:   23.80 ml  12.11 ml/m LA Vol (A4C):   48.0 ml 24.43 ml/m LA Biplane Vol: 46.7 ml 23.77 ml/m  AORTIC VALVE LVOT Vmax:   86.60 cm/s LVOT Vmean:  55.700 cm/s LVOT VTI:    0.159 m AI PHT:      514 msec  AORTA Ao Root diam: 3.70 cm Ao Asc diam:  3.60 cm MITRAL VALVE                        TRICUSPID VALVE MV Area (PHT): 3.75 cm             TR Peak grad:   17.6 mmHg MV PHT:        58.72 msec           TR Vmax:        210.00 cm/s MV Decel Time:  203 msec MV E velocity: 109.00 cm/s 103 cm/s SHUNTS                                     Systemic VTI:  0.16 m                                     Systemic Diam: 1.80 cm  Dani Gobble Croitoru MD Electronically signed by Sanda Klein MD Signature Date/Time: 09/21/2019/4:43:21 PM    Final     Scheduled Meds: . apixaban  5 mg Oral BID  . aspirin EC  81 mg Oral Daily  . atorvastatin  20 mg Oral q1800  . brimonidine  1 drop Left Eye Q12H   And  . timolol  1 drop Left Eye Q12H  . brinzolamide  1 drop Left Eye BID  . Chlorhexidine Gluconate Cloth  6 each Topical Daily  . diltiazem  30 mg Oral Q6H  . docusate sodium  100 mg Oral Daily  .  DULoxetine  20 mg Oral QHS  . ipratropium-albuterol  3 mL Nebulization BID  . latanoprost  1 drop Left Eye QHS  . letrozole  2.5 mg Oral Daily  . levothyroxine  100 mcg Oral QAC breakfast  . metoprolol tartrate  12.5 mg Oral BID  . pantoprazole  40 mg Oral Daily  . pregabalin  25 mg Oral QHS  . psyllium  1 packet Oral QHS    Continuous Infusions: . meropenem (MERREM) IV 1 g (09/22/19 1124)     LOS: 4 days     Kayleen Memos, MD [facility] Pager [phone]  If 7PM-7AM, please contact night-coverage [email or web address] Password [facility] 09/22/2019, 3:16 PM

## 2019-09-22 NOTE — Progress Notes (Signed)
Pharmacy Antibiotic Note  Victoria Walsh is a 86 y.o. female with chronic foley cath and hx ESBL Ecoli UTI presented to the ED on 09/18/2019 with fever and clogged foley cath.  She was started on meropenem on admission for suspected UTI.  BCx and UCx are both positive for ESBL Ecoli.  Today, 09/22/2019: - day #5 abx - afeb, WBC down wnl - scr continues to improve with 0.89 today (CrCl~53)   Plan: - Adjust Meropenem dose to 1g IV q8h for renal function _______________________________________  Height: 5\' 8"  (172.7 cm) Weight: 182 lb 5.1 oz (82.7 kg) IBW/kg (Calculated) : 63.9  Temp (24hrs), Avg:98.2 F (36.8 C), Min:98 F (36.7 C), Max:98.4 F (36.9 C)  Recent Labs  Lab 09/18/19 1137 09/18/19 1137 09/19/19 0514 09/20/19 0500 09/20/19 0503 09/21/19 0401 09/22/19 0522  WBC 18.7*  --  17.4* 8.8  --  6.7 8.9  CREATININE 1.35*   < > 1.30* 1.13* 1.05* 1.00 0.89  LATICACIDVEN 1.4  --   --   --   --   --   --    < > = values in this interval not displayed.    Estimated Creatinine Clearance: 52.1 mL/min (by C-G formula based on SCr of 0.89 mg/dL).    Allergies  Allergen Reactions  . Ciprofloxacin Other (See Comments)    On MAR  . Oxaprozin Other (See Comments)    DAYPRO (SULFA DRUG) On MAR  . Sulfa Antibiotics Other (See Comments)    On MAR    Antimicrobials this admission:  2/2 Meropenem >>    Microbiology results:  Previous: 09/21/18 ucx: >100K ESBL Ecoli  04/29/19 ucx: >100k proteus (R= nitrof); 80K ESBL ecoli (S=gent, imi, nitro, zosyn)  Current: 2/2 BCx: 1/3 bottles with GNR (BCID=Ecoli) ESBL Ecoli (S= gent, imi, zosyn) FINAL 2/2 UCx: ESBL Ecoli (S= gent, imi, nitro, zosyn) FINAL 2/2 Resp Panel:  neg for covid and influ 2/2 UA: many bac, >50 wbc, large leuk, nitrite pos 2/4 repeat BCx x2:   Thank you for allowing pharmacy to be a part of this patient's care.  Lynelle Doctor 09/22/2019 9:07 AM

## 2019-09-22 NOTE — Progress Notes (Addendum)
Progress Note  Patient Name: Victoria Walsh Date of Encounter: 09/22/2019  Primary Cardiologist: No primary care provider on file.   Subjective   Denies any chest pain or SOB.  Remains in afib in the low 100-100bpm range  Inpatient Medications    Scheduled Meds: . apixaban  5 mg Oral BID  . aspirin EC  81 mg Oral Daily  . atorvastatin  20 mg Oral q1800  . brimonidine  1 drop Left Eye Q12H   And  . timolol  1 drop Left Eye Q12H  . brinzolamide  1 drop Left Eye BID  . Chlorhexidine Gluconate Cloth  6 each Topical Daily  . docusate sodium  100 mg Oral Daily  . DULoxetine  20 mg Oral QHS  . ipratropium  0.5 mg Nebulization TID  . latanoprost  1 drop Left Eye QHS  . letrozole  2.5 mg Oral Daily  . levalbuterol  0.63 mg Nebulization TID  . levothyroxine  100 mcg Oral QAC breakfast  . metoprolol tartrate  12.5 mg Oral BID  . pantoprazole  40 mg Oral Daily  . pregabalin  25 mg Oral QHS  . psyllium  1 packet Oral QHS   Continuous Infusions: . diltiazem (CARDIZEM) infusion 5 mg/hr (09/22/19 0600)  . meropenem (MERREM) IV Stopped (09/21/19 2348)   PRN Meds: acetaminophen, acetaminophen, metoprolol tartrate, ondansetron   Vital Signs    Vitals:   09/21/19 1812 09/21/19 1955 09/21/19 2123 09/22/19 0439  BP: 111/83 111/61  121/70  Pulse: 91 73  76  Resp:  18  18  Temp:  98 F (36.7 C)  98.4 F (36.9 C)  TempSrc:  Oral  Oral  SpO2:  99% 99% 96%  Weight:      Height:        Intake/Output Summary (Last 24 hours) at 09/22/2019 0842 Last data filed at 09/22/2019 0600 Gross per 24 hour  Intake 474.99 ml  Output 1000 ml  Net -525.01 ml   Filed Weights   09/18/19 1457 09/21/19 0426  Weight: 79.9 kg 82.7 kg    Telemetry    Atrial fibrillation - Personally Reviewed  ECG    No new EKG to review - Personally Reviewed  Physical Exam   GEN: thin frail ill appearing HEENT: Normal NECK: No JVD; No carotid bruits LYMPHATICS: No lymphadenopathy CARDIAC:irregularly  irregular, no murmurs, rubs, gallops RESPIRATORY:  Clear to auscultation without rales, wheezing or rhonchi  ABDOMEN: Soft, non-tender, non-distended MUSCULOSKELETAL:  No edema; No deformity  SKIN: Warm and dry NEUROLOGIC:  Alert and oriented x 3 PSYCHIATRIC:  Normal affect    Labs    Chemistry Recent Labs  Lab 09/18/19 1137 09/19/19 0514 09/20/19 0500 09/20/19 0500 09/20/19 0503 09/21/19 0401 09/22/19 0522  NA 141   < > 143  --   --  143 143  K 3.9   < > 3.5  --   --  3.6 3.2*  CL 110   < > 117*  --   --  120* 110  CO2 19*   < > 17*  --   --  18* 18*  GLUCOSE 143*   < > 99  --   --  104* 110*  BUN 31*   < > 30*  --   --  26* 25*  CREATININE 1.35*   < > 1.13*   < > 1.05* 1.00 0.89  CALCIUM 8.7*   < > 7.5*  --   --  8.0* 7.8*  PROT  6.9  --   --   --   --   --   --   ALBUMIN 3.9  --   --   --   --   --   --   AST 19  --   --   --   --   --   --   ALT 16  --   --   --   --   --   --   ALKPHOS 77  --   --   --   --   --   --   BILITOT 1.3*  --   --   --   --   --   --   GFRNONAA 36*   < > 44*   < > 48* 51* 59*  GFRAA 41*   < > 51*   < > 56* 59* >60  ANIONGAP 12   < > 9  --   --  5 15   < > = values in this interval not displayed.     Hematology Recent Labs  Lab 09/20/19 0500 09/21/19 0401 09/22/19 0522  WBC 8.8 6.7 8.9  RBC 2.70* 3.03* 3.06*  HGB 8.6* 9.3* 9.7*  HCT 28.1* 30.7* 30.8*  MCV 104.1* 101.3* 100.7*  MCH 31.9 30.7 31.7  MCHC 30.6 30.3 31.5  RDW 13.5 13.6 13.5  PLT 132* 136* 133*    Cardiac EnzymesNo results for input(s): TROPONINI in the last 168 hours. No results for input(s): TROPIPOC in the last 168 hours.   BNPNo results for input(s): BNP, PROBNP in the last 168 hours.   DDimer No results for input(s): DDIMER in the last 168 hours.   Radiology    DG CHEST PORT 1 VIEW  Result Date: 09/21/2019 CLINICAL DATA:  Wheezing, dementia EXAM: PORTABLE CHEST 1 VIEW COMPARISON:  09/18/2019 FINDINGS: Single frontal view of the chest demonstrates a stable  cardiac silhouette. Increasing interstitial prominence with Kerley B-lines consistent with mild interstitial edema. No airspace disease, effusion, or pneumothorax. IMPRESSION: 1. Mild interstitial edema. Electronically Signed   By: Randa Ngo M.D.   On: 09/21/2019 08:53   ECHOCARDIOGRAM COMPLETE  Result Date: 09/21/2019   ECHOCARDIOGRAM REPORT   Patient Name:   Victoria Walsh Date of Exam: 09/21/2019 Medical Rec #:  [id number]   Height:       68.0 in Accession #:    [id number]  Weight:       182.3 lb Date of Birth:  1932-07-22   BSA:          1.96 m Patient Age:    86 years    BP:           108/52 mmHg Patient Gender: F           HR:           105 bpm. Exam Location:  Inpatient Procedure: 2D Echo, Cardiac Doppler and Color Doppler Indications:    I48.0 Paroxysmal atrial fibrillation  History:        Patient has no prior history of Echocardiogram examinations.                 Stroke; Risk Factors:Dyslipidemia. CKD.  Sonographer:    Jonelle Sidle Dance Referring Phys: [id number] [place]  1. Left ventricular ejection fraction, by visual estimation, is 55 to 60%. The left ventricle has normal function. There is no left ventricular hypertrophy.  2. Left ventricular diastolic parameters are indeterminate due to atrial fibrillation.  3. The left ventricle has no regional wall motion abnormalities.  4. Global right ventricle has normal systolic function.The right ventricular size is normal. No increase in right ventricular wall thickness.  5. Left atrial size was normal.  6. Right atrial size was normal.  7. Mild mitral annular calcification.  8. The mitral valve is normal in structure. Mild mitral valve regurgitation. No evidence of mitral stenosis.  9. The tricuspid valve is normal in structure. Tricuspid valve regurgitation is not demonstrated. 10. The aortic valve is normal in structure. Aortic valve regurgitation is not visualized. Mild to moderate aortic valve stenosis. 11. The pulmonic valve was normal in  structure. Pulmonic valve regurgitation is not visualized. 12. Normal pulmonary artery systolic pressure. 13. The inferior vena cava is normal in size with greater than 50% respiratory variability, suggesting right atrial pressure of 3 mmHg. FINDINGS  Left Ventricle: Left ventricular ejection fraction, by visual estimation, is 55 to 60%. The left ventricle has normal function. The left ventricle has no regional wall motion abnormalities. There is no left ventricular hypertrophy. Left ventricular diastolic parameters are indeterminate. Normal left atrial pressure. Right Ventricle: The right ventricular size is normal. No increase in right ventricular wall thickness. Global RV systolic function is has normal systolic function. The tricuspid regurgitant velocity is 2.10 m/s, and with an assumed right atrial pressure  of 3 mmHg, the estimated right ventricular systolic pressure is normal at 20.6 mmHg. Left Atrium: Left atrial size was normal in size. Right Atrium: Right atrial size was normal in size Pericardium: There is no evidence of pericardial effusion. Mitral Valve: The mitral valve is normal in structure. Mild mitral annular calcification. Mild mitral valve regurgitation. No evidence of mitral valve stenosis by observation. Tricuspid Valve: The tricuspid valve is normal in structure. Tricuspid valve regurgitation is not demonstrated. Aortic Valve: The aortic valve is normal in structure.. There is moderate thickening and mild calcification of the aortic valve. Aortic valve regurgitation is not visualized. Aortic regurgitation PHT measures 514 msec. Mild to moderate aortic stenosis is  present. There is moderate thickening of the aortic valve. There is mild calcification of the aortic valve. Pulmonic Valve: The pulmonic valve was normal in structure. Pulmonic valve regurgitation is not visualized. Pulmonic regurgitation is not visualized. Aorta: The aortic root, ascending aorta and aortic arch are all structurally  normal, with no evidence of dilitation or obstruction. Venous: The inferior vena cava is normal in size with greater than 50% respiratory variability, suggesting right atrial pressure of 3 mmHg. IAS/Shunts: No atrial level shunt detected by color flow Doppler. There is no evidence of a patent foramen ovale. No ventricular septal defect is seen or detected. There is no evidence of an atrial septal defect.  LEFT VENTRICLE PLAX 2D LVIDd:         3.80 cm LVIDs:         3.10 cm LV PW:         1.20 cm LV IVS:        1.00 cm LVOT diam:     1.80 cm LV SV:         24 ml LV SV Index:   11.96 LVOT Area:     2.54 cm  RIGHT VENTRICLE             IVC RV Basal diam:  2.80 cm     IVC diam: 1.70 cm RV S prime:     10.30 cm/s TAPSE (M-mode): 1.9 cm LEFT ATRIUM  Index       RIGHT ATRIUM           Index LA diam:        3.40 cm 1.73 cm/m  RA Area:     12.00 cm LA Vol (A2C):   41.6 ml 21.17 ml/m RA Volume:   23.80 ml  12.11 ml/m LA Vol (A4C):   48.0 ml 24.43 ml/m LA Biplane Vol: 46.7 ml 23.77 ml/m  AORTIC VALVE LVOT Vmax:   86.60 cm/s LVOT Vmean:  55.700 cm/s LVOT VTI:    0.159 m AI PHT:      514 msec  AORTA Ao Root diam: 3.70 cm Ao Asc diam:  3.60 cm MITRAL VALVE                        TRICUSPID VALVE MV Area (PHT): 3.75 cm             TR Peak grad:   17.6 mmHg MV PHT:        58.72 msec           TR Vmax:        210.00 cm/s MV Decel Time: 203 msec MV E velocity: 109.00 cm/s 103 cm/s SHUNTS                                     Systemic VTI:  0.16 m                                     Systemic Diam: 1.80 cm  Sanda Klein MD Electronically signed by Sanda Klein MD Signature Date/Time: 09/21/2019/4:43:21 PM    Final     Cardiac Studies   2D echo 09/2019  IMPRESSIONS    1. Left ventricular ejection fraction, by visual estimation, is 55 to  60%. The left ventricle has normal function. There is no left ventricular  hypertrophy.  2. Left ventricular diastolic parameters are indeterminate due to atrial    fibrillation.  3. The left ventricle has no regional wall motion abnormalities.  4. Global right ventricle has normal systolic function.The right  ventricular size is normal. No increase in right ventricular wall  thickness.  5. Left atrial size was normal.  6. Right atrial size was normal.  7. Mild mitral annular calcification.  8. The mitral valve is normal in structure. Mild mitral valve  regurgitation. No evidence of mitral stenosis.  9. The tricuspid valve is normal in structure. Tricuspid valve  regurgitation is not demonstrated.  10. The aortic valve is normal in structure. Aortic valve regurgitation is  not visualized. Mild to moderate aortic valve stenosis.  11. The pulmonic valve was normal in structure. Pulmonic valve  regurgitation is not visualized.  12. Normal pulmonary artery systolic pressure.  13. The inferior vena cava is normal in size with greater than 50%  respiratory variability, suggesting right atrial pressure of 3 mmHg.   Patient Profile     86 y.o. 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 [place] Crane.  Assessment & Plan    1.  Atrial fibrillation with RVR -HR for most part in the 80's but jumps into the 150's at times -stop Cardizem IV and start Cardizem PO 30mg  q6 hours -2D echo with normal LVF -CHADS2VASC score is 6 but has been high risk for anticoagulation in the past due to multiple falls -TSH normal at 1.04 -continue Lopressor 12.5mg  BID and Apixaban 5mg  BID  2.  Sepsis/bacteremia  -secondary to E Coli UTI with chronic foley -per [facility]  3.  Hx of remote CVA -on coumadin but in 2013 and 2014 was on ASA and plavix.  - between 2014 and 2016 was  placed on xarelto and this was stopped sometime after 04/2019.   -She did have several falls which may have led to her stopping.  -given high CHADS2VASC score and hx of CVA now started on Eliquis in setting of afib  4.  HTN -controlled  -continue Lopressor and Cardizem  5.  AKI -secondary to dehydration and UTI -creatinine 0.91   6.  Hypokalemia -K+ 3.3  -replacement per [facility] once BMET back today -need to keep K+>4    For questions or updates, please contact [place] Please consult [email or web address] for contact info under Cardiology/STEMI.      Signed, Fransico Him, MD  09/22/2019, 8:42 AM

## 2019-09-22 NOTE — Plan of Care (Signed)

## 2019-09-22 NOTE — Plan of Care (Signed)

## 2019-09-23 LAB — BASIC METABOLIC PANEL
Anion gap: 10 (ref 5–15)
BUN: 25 mg/dL — ABNORMAL HIGH (ref 8–23)
CO2: 20 mmol/L — ABNORMAL LOW (ref 22–32)
Calcium: 8.1 mg/dL — ABNORMAL LOW (ref 8.9–10.3)
Chloride: 112 mmol/L — ABNORMAL HIGH (ref 98–111)
Creatinine, Ser: 0.81 mg/dL (ref 0.44–1.00)
GFR calc Af Amer: >60 mL/min (ref >60)
GFR calc non Af Amer: >60 mL/min (ref >60)
Glucose, Bld: 100 mg/dL — ABNORMAL HIGH (ref 70–99)
Potassium: 3.5 mmol/L (ref 3.5–5.1)
Sodium: 142 mmol/L (ref 135–145)

## 2019-09-23 LAB — CBC WITH DIFFERENTIAL/PLATELET
Abs Immature Granulocytes: 0.03 10*3/uL (ref 0.00–0.07)
Basophils Absolute: 0 10*3/uL (ref 0.0–0.1)
Basophils Relative: 1 %
Eosinophils Absolute: 0.2 10*3/uL (ref 0.0–0.5)
Eosinophils Relative: 3 %
HCT: 29.2 % — ABNORMAL LOW (ref 36.0–46.0)
Hemoglobin: 9 g/dL — ABNORMAL LOW (ref 12.0–15.0)
Immature Granulocytes: 0 %
Lymphocytes Relative: 13 %
Lymphs Abs: 1.1 10*3/uL (ref 0.7–4.0)
MCH: 30.8 pg (ref 26.0–34.0)
MCHC: 30.8 g/dL (ref 30.0–36.0)
MCV: 100 fL (ref 80.0–100.0)
Monocytes Absolute: 0.6 10*3/uL (ref 0.1–1.0)
Monocytes Relative: 8 %
Neutro Abs: 6.3 10*3/uL (ref 1.7–7.7)
Neutrophils Relative %: 75 %
Platelets: 151 10*3/uL (ref 150–400)
RBC: 2.92 MIL/uL — ABNORMAL LOW (ref 3.87–5.11)
RDW: 13.3 % (ref 11.5–15.5)
WBC: 8.3 10*3/uL (ref 4.0–10.5)
nRBC: 0 % (ref 0.0–0.2)

## 2019-09-23 LAB — CULTURE, BLOOD (ROUTINE X 2): Culture: NO GROWTH

## 2019-09-23 MED ORDER — SODIUM CHLORIDE 0.9% FLUSH: 10.0000 mL | INTRAVENOUS | Status: DC | PRN

## 2019-09-23 MED ORDER — SODIUM CHLORIDE 0.9% FLUSH
10.0000 mL | Freq: Two times a day (BID) | INTRAVENOUS | Status: DC
  Administered 2019-09-23 – 2019-09-25 (×4): 10 mL

## 2019-09-23 MED ORDER — DILTIAZEM HCL 60 MG PO TABS
60.0000 mg | ORAL_TABLET | Freq: Four times a day (QID) | ORAL | Status: DC
  Administered 2019-09-23 – 2019-09-24 (×3): 60 mg via ORAL
  Filled 2019-09-23 (×5): qty 1

## 2019-09-23 NOTE — Progress Notes (Signed)
PROGRESS NOTE  Victoria Walsh [id number] DOB: October 18, 1931 DOA: 09/18/2019 PCP: Reymundo Poll, MD  HPI/Recap of past 24 hours: Victoria Walsh is a 86 y.o. 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  Due to patient mental status, all information was taken from the son on phone.  Patient has neurogenic bladder from stroke so she has a chronic indwelling Foley catheter.  Facility reported that the catheter stopped draining.  When she presented here she had fever of 102 F, tachycardic.  Foley catheter was very dirty and it was not draining urine.  Her blood pressure was stable on presentation.  She has history of ESBL E. coli UTI.  Presentation to the ED positive UA and urine culture greater than 100,000 colonies of ESBL E. coli.  ESBL E. coli bacteremia.  She is on meropenem.  Hospital course complicated by A. fib with RVR on 09/21/2019.  No prior history of A. fib.  Cardiology consulted.  Was placed on diltiazem drip with improvement of heart rate.  2D echo showed normal LVEF.  09/23/19: Seen and examined.  Looks improved clinically.  No acute events overnight.  She has no new complaints.       Assessment/Plan: Principal Problem:   Sepsis ([place]) Active Problems:   Acute lower UTI   AKI (acute kidney injury) ([place])   Dementia with behavioral disturbance (HCC)   Ischemic stroke ([place])   Hypothyroidism   Breast cancer ([place])   Atrial fibrillation with RVR (HCC)   Chronic diastolic CHF (congestive heart failure) (HCC)   Coronary artery disease involving native coronary artery of native heart without angina pectoris   Essential hypertension  Resolving sepsis secondary to recurrent ESBL E. Coli bacteremia likely urinary source from ESBL E. coli UTI History  of ESBL E. coli UTI. Sepsis criteria is improving Afebrile with no leukocytosis Continue meropenem day #3  New A. fib with RVR TSH normal. CHADSVASC score 6 Cardiology following. Now on Eliquis 5 mg twice daily. 2D echo showed normal LVEF. Continue Lopressor 12.5 mg twice daily, p.o. Cardizem 30 mg every 6 hours. Continue to closely monitor vital signs  Resolved AKI likely prerenal in the setting of dehydration from poor oral intake Baseline creatinine appears to be 0.8 with GFR greater than 60 Presented with creatinine of 1.3 with GFR of 37. Creatinine is back to her baseline 0.8 with GFR greater than 60. Continue to avoid nephrotoxins and hypotension Continue to monitor urine output BMPs  Chronic macrocytic anemia with mild drop in hemoglobin from 9.7 to 9.0. MCV 100 Baseline hemoglobin appears to be 11 No sign of overt bleeding Continue to monitor H&H  Hx of ischemic CVA C/w ASA LDL 68 Continue Lipitor 20 mg nightly  Dementia Reorient as needed Fall precautions  Hypothyroidism Continue Synthroid Tsh normal  GERD Continue PPI  History of left breast cancer  Continue letrozole   DVT prophylaxis: Lovenox subcu daily. Code Status: Full code Family Communication:  None at bedside. Consults called:  Cardiology.  Disposition: Patient is from SNF.  Anticipate discharge to SNF.  Barrier to discharge ongoing treatment for E. coli ESBL bacteremia on IV antibiotics.  Objective: Vitals:   09/22/19 2028 09/23/19 0556 09/23/19 0756 09/23/19 1217  BP: 128/64 109/73  120/66  Pulse: 81 (!) 110  69  Resp: 16 20  16  Temp: 98.4 F (36.9 C) 97.7 F (36.5 C)  (!) 97.3 F (36.3 C)  TempSrc:  Oral  Oral  SpO2: 98% 95% 98% 99%  Weight:      Height:        Intake/Output Summary (Last 24 hours) at 09/23/2019 1408 Last data filed at 09/23/2019 0913 Gross per 24 hour  Intake 476.12 ml  Output 400 ml  Net 76.12 ml   Filed Weights   09/18/19 1457 09/21/19 0426  Weight:  79.9 kg 82.7 kg    Exam:  . General: 86 y.o. year-old female pleasantly demented in no acute distress.  Alert and interactive.   . Cardiovascular: Irregular rate and rhythm no rubs or gallops.   Respiratory: Clear to auscultation no wheezes or rales.   Abdomen: Obese nontender normal bowel sounds present.   Musculoskeletal: Trace lower extremity edema bilaterally.   Psychiatry: Mood is appropriate for condition and setting.   Data Reviewed: CBC: Recent Labs  Lab 09/18/19 1137 09/18/19 1137 09/19/19 0514 09/20/19 0500 09/21/19 0401 09/22/19 0522 09/23/19 0543  WBC 18.7*   < > 17.4* 8.8 6.7 8.9 8.3  NEUTROABS 16.6*  --   --  7.5 5.2 6.9 6.3  HGB 11.8*   < > 9.5* 8.6* 9.3* 9.7* 9.0*  HCT 37.7   < > 30.5* 28.1* 30.7* 30.8* 29.2*  MCV 99.5   < > 102.3* 104.1* 101.3* 100.7* 100.0  PLT 217   < > 155 132* 136* 133* 151   < > = values in this interval not displayed.   Basic Metabolic Panel: Recent Labs  Lab 09/20/19 0500 09/20/19 0500 09/20/19 0503 09/21/19 0401 09/22/19 0522 09/22/19 1052 09/23/19 0543  NA 143  --   --  143 143 141 142  K 3.5  --   --  3.6 3.2* 3.3* 3.5  CL 117*  --   --  120* 110 111 112*  CO2 17*  --   --  18* 18* 21* 20*  GLUCOSE 99  --   --  104* 110* 119* 100*  BUN 30*  --   --  26* 25* 27* 25*  CREATININE 1.13*   < > 1.05* 1.00 0.89 0.91 0.81  CALCIUM 7.5*  --   --  8.0* 7.8* 8.1* 8.1*  MG  --   --   --   --  1.8  --   --   PHOS  --   --   --   --  2.3*  --   --    < > = values in this interval not displayed.   GFR: Estimated Creatinine Clearance: 57.2 mL/min (by C-G formula based on SCr of 0.81 mg/dL). Liver Function Tests: Recent Labs  Lab 09/18/19 1137  AST 19  ALT 16  ALKPHOS 77  BILITOT 1.3*  PROT 6.9  ALBUMIN 3.9   No results for input(s): LIPASE, AMYLASE in the last 168 hours. No results for input(s): AMMONIA in the last 168 hours. Coagulation Profile: Recent Labs  Lab 09/18/19 1137  INR 1.1   Cardiac Enzymes: No  results for input(s): CKTOTAL, CKMB, CKMBINDEX, TROPONINI in the last 168 hours. BNP (last 3 results) No results for input(s): PROBNP in the last 8760 hours. HbA1C: No results for input(s): HGBA1C in the last 72 hours. CBG: No results for input(s): GLUCAP in the last 168 hours. Lipid Profile: Recent Labs    09/21/19 1607  CHOL 123  HDL 20*  LDLCALC 68  TRIG 173*  CHOLHDL  6.2   Thyroid Function Tests: Recent Labs    09/20/19 1555  TSH 1.040   Anemia Panel: Recent Labs    09/21/19 1607  VITAMINB12 275   Urine analysis:    Component Value Date/Time   COLORURINE YELLOW 09/18/2019 1138   APPEARANCEUR TURBID (A) 09/18/2019 1138   LABSPEC 1.013 09/18/2019 1138   PHURINE 6.0 09/18/2019 1138   GLUCOSEU NEGATIVE 09/18/2019 1138   HGBUR SMALL (A) 09/18/2019 1138   BILIRUBINUR NEGATIVE 09/18/2019 1138   [place] 09/18/2019 1138   PROTEINUR 100 (A) 09/18/2019 1138   NITRITE POSITIVE (A) 09/18/2019 1138   LEUKOCYTESUR LARGE (A) 09/18/2019 1138   Sepsis Labs: @LABRCNTIP (procalcitonin:4,lacticidven:4)  ) Recent Results (from the past 240 hour(s))  Blood Culture (routine x 2)     Status: Abnormal   Collection Time: 09/18/19 11:38 AM   Specimen: BLOOD  Result Value Ref Range Status   Specimen Description   Final    BLOOD LEFT ARM Performed at [facility], [place] [address] [place], [place] [address]    Special Requests   Final    BOTTLES DRAWN AEROBIC AND ANAEROBIC Blood Culture adequate volume Performed at [place] [address] [place], [place] [address]    Culture  Setup Time   Final    GRAM NEGATIVE RODS AEROBIC BOTTLE ONLY CRITICAL RESULT CALLED TO, READ BACK BY AND VERIFIED WITH: [place]. [id number] FCP Performed at [facility], [place] [address], [place], [place] [address]    Culture (A)  Final    ESCHERICHIA COLI Confirmed Extended Spectrum Beta-Lactamase Producer (ESBL).  In bloodstream  infections from ESBL organisms, carbapenems are preferred over piperacillin/tazobactam. They are shown to have a lower risk of mortality.    Report Status 09/21/2019 FINAL  Final   Organism ID, Bacteria ESCHERICHIA COLI  Final      Susceptibility   Escherichia coli - MIC*    AMPICILLIN >=32 RESISTANT Resistant     CEFAZOLIN >=64 RESISTANT Resistant     CEFEPIME >=32 RESISTANT Resistant     CEFTAZIDIME RESISTANT Resistant     CEFTRIAXONE >=64 RESISTANT Resistant     CIPROFLOXACIN >=4 RESISTANT Resistant     GENTAMICIN <=1 SENSITIVE Sensitive     IMIPENEM <=0.25 SENSITIVE Sensitive     TRIMETH/SULFA >=320 RESISTANT Resistant     AMPICILLIN/SULBACTAM >=32 RESISTANT Resistant     PIP/TAZO <=4 SENSITIVE Sensitive     * ESCHERICHIA COLI  Blood Culture (routine x 2)     Status: None   Collection Time: 09/18/19 11:38 AM   Specimen: BLOOD  Result Value Ref Range Status   Specimen Description   Final    BLOOD RIGHT ARM Performed at [facility] [address], [place], [place] [address]    Special Requests   Final    BOTTLES DRAWN AEROBIC ONLY Blood Culture results may not be optimal due to an inadequate volume of blood received in culture bottles Performed at [facility] [address], [place], [place] [address]    Culture   Final    NO GROWTH 5 DAYS Performed at [facility], [place] [address], [place], [place] [address]    Report Status 09/23/2019 FINAL  Final  Urine culture     Status: Abnormal   Collection Time: 09/18/19 11:38 AM   Specimen: In/Out Cath Urine  Result Value Ref Range Status   Specimen Description   Final  IN/OUT CATH URINE Performed at [facility], [place] [address], [place], [place] [address]    Special Requests   Final    NONE Performed at [facility], [place] [address], [place], [place] [address]    Culture (A)  Final    >=100,000 COLONIES/mL ESCHERICHIA COLI Confirmed  Extended Spectrum Beta-Lactamase Producer (ESBL).  In bloodstream infections from ESBL organisms, carbapenems are preferred over piperacillin/tazobactam. They are shown to have a lower risk of mortality.    Report Status 09/20/2019 FINAL  Final   Organism ID, Bacteria ESCHERICHIA COLI (A)  Final      Susceptibility   Escherichia coli - MIC*    AMPICILLIN >=32 RESISTANT Resistant     CEFAZOLIN >=64 RESISTANT Resistant     CEFTRIAXONE >=64 RESISTANT Resistant     CIPROFLOXACIN >=4 RESISTANT Resistant     GENTAMICIN <=1 SENSITIVE Sensitive     IMIPENEM <=0.25 SENSITIVE Sensitive     NITROFURANTOIN <=16 SENSITIVE Sensitive     TRIMETH/SULFA >=320 RESISTANT Resistant     AMPICILLIN/SULBACTAM >=32 RESISTANT Resistant     PIP/TAZO <=4 SENSITIVE Sensitive     * >=100,000 COLONIES/mL ESCHERICHIA COLI  Blood Culture ID Panel (Reflexed)     Status: Abnormal   Collection Time: 09/18/19 11:38 AM  Result Value Ref Range Status   Enterococcus species NOT DETECTED NOT DETECTED Final   Listeria monocytogenes NOT DETECTED NOT DETECTED Final   Staphylococcus species NOT DETECTED NOT DETECTED Final   Staphylococcus aureus (BCID) NOT DETECTED NOT DETECTED Final   Streptococcus species NOT DETECTED NOT DETECTED Final   Streptococcus agalactiae NOT DETECTED NOT DETECTED Final   Streptococcus pneumoniae NOT DETECTED NOT DETECTED Final   Streptococcus pyogenes NOT DETECTED NOT DETECTED Final   Acinetobacter baumannii NOT DETECTED NOT DETECTED Final   Enterobacteriaceae species DETECTED (A) NOT DETECTED Final    Comment: Enterobacteriaceae represent a large family of gram-negative bacteria, not a single organism. CRITICAL RESULT CALLED TO, READ BACK BY AND VERIFIED WITH: PHARMD DREW W[place] [id number] FCP    Enterobacter cloacae complex NOT DETECTED NOT DETECTED Final   Escherichia coli DETECTED (A) NOT DETECTED Final    Comment: CRITICAL RESULT CALLED TO, READ BACK BY AND VERIFIED WITH: PHARMD DREW  [place]OFF[place] [id number] FCP    Klebsiella oxytoca NOT DETECTED NOT DETECTED Final   Klebsiella pneumoniae NOT DETECTED NOT DETECTED Final   Proteus species NOT DETECTED NOT DETECTED Final   Serratia marcescens NOT DETECTED NOT DETECTED Final   Carbapenem resistance NOT DETECTED NOT DETECTED Final   Haemophilus influenzae NOT DETECTED NOT DETECTED Final   Neisseria meningitidis NOT DETECTED NOT DETECTED Final   Pseudomonas aeruginosa NOT DETECTED NOT DETECTED Final   Candida albicans NOT DETECTED NOT DETECTED Final   Candida glabrata NOT DETECTED NOT DETECTED Final   Candida krusei NOT DETECTED NOT DETECTED Final   Candida parapsilosis NOT DETECTED NOT DETECTED Final   Candida tropicalis NOT DETECTED NOT DETECTED Final    Comment: Performed at [facility], [place] [address] [place], [place] [address]  Respiratory Panel by RT PCR (Flu A&B, Covid) - Nasopharyngeal Swab     Status: None   Collection Time: 09/18/19  1:20 PM   Specimen: Nasopharyngeal Swab  Result Value Ref Range Status   SARS Coronavirus 2 by RT PCR NEGATIVE NEGATIVE Final    Comment: (NOTE) SARS-CoV-2 target nucleic acids are NOT DETECTED. The SARS-CoV-2 RNA is generally detectable in upper respiratoy specimens during the acute  phase of infection. The lowest concentration of SARS-CoV-2 viral copies this assay can detect is 131 copies/mL. A negative result does not preclude SARS-Cov-2 infection and should not be used as the sole basis for treatment or other patient management decisions. A negative result may occur with  improper specimen collection/handling, submission of specimen other than nasopharyngeal swab, presence of viral mutation(s) within the areas targeted by this assay, and inadequate number of viral copies (<131 copies/mL). A negative result must be combined with clinical observations, patient history, and epidemiological information. The expected result is Negative. Fact Sheet for Patients:    PinkCheek.be Fact Sheet for Healthcare Providers:  GravelBags.it This test is not yet ap proved or cleared by the [place] FDA and  has been authorized for detection and/or diagnosis of SARS-CoV-2 by FDA under an Emergency Use Authorization (EUA). This EUA will remain  in effect (meaning this test can be used) for the duration of the COVID-19 declaration under Section 564(b)(1) of the Act, 21 U.S.C. section 360bbb-3(b)(1), unless the authorization is terminated or revoked sooner.    Influenza A by PCR NEGATIVE NEGATIVE Final   Influenza B by PCR NEGATIVE NEGATIVE Final    Comment: (NOTE) The Xpert Xpress SARS-CoV-2/FLU/RSV assay is intended as an aid in  the diagnosis of influenza from Nasopharyngeal swab specimens and  should not be used as a sole basis for treatment. Nasal washings and  aspirates are unacceptable for Xpert Xpress SARS-CoV-2/FLU/RSV  testing. Fact Sheet for Patients: PinkCheek.be Fact Sheet for Healthcare Providers: GravelBags.it This test is not yet approved or cleared by the [place] FDA and  has been authorized for detection and/or diagnosis of SARS-CoV-2 by  FDA under an Emergency Use Authorization (EUA). This EUA will remain  in effect (meaning this test can be used) for the duration of the  Covid-19 declaration under Section 564(b)(1) of the Act, 21  U.S.C. section 360bbb-3(b)(1), unless the authorization is  terminated or revoked. Performed at [facility], [place] [address], [place], [place] [address]   Culture, blood (routine x 2)     Status: None (Preliminary result)   Collection Time: 09/20/19  5:03 AM   Specimen: BLOOD  Result Value Ref Range Status   Specimen Description   Final    BLOOD RIGHT ANTECUBITAL Performed at [place] [address], [place], [place] [address]    Special  Requests   Final    BOTTLES DRAWN AEROBIC AND ANAEROBIC Blood Culture adequate volume Performed at [facility] [address] [place], [place] [address]    Culture   Final    NO GROWTH 3 DAYS Performed at [facility], [place] [address], [place], [place] [address]    Report Status PENDING  Incomplete  Culture, blood (routine x 2)     Status: None (Preliminary result)   Collection Time: 09/20/19  5:03 AM   Specimen: BLOOD RIGHT HAND  Result Value Ref Range Status   Specimen Description   Final    BLOOD RIGHT HAND Performed at [facility] [address] [place], [place] [address]    Special Requests   Final    BOTTLES DRAWN AEROBIC ONLY Blood Culture adequate volume Performed at [place] [address], [place], [place] [address]    Culture   Final    NO GROWTH 3 DAYS Performed at [facility], [place] [address] [place], [place] [address]    Report Status  PENDING  Incomplete      Studies: No results found.  Scheduled Meds: . apixaban  5 mg Oral BID  . aspirin EC  81 mg Oral Daily  . atorvastatin  20 mg Oral q1800  . brimonidine  1 drop Left Eye Q12H   And  . timolol  1 drop Left Eye Q12H  . brinzolamide  1 drop Left Eye BID  . Chlorhexidine Gluconate Cloth  6 each Topical Daily  . diltiazem  60 mg Oral Q6H  . docusate sodium  100 mg Oral Daily  . DULoxetine  20 mg Oral QHS  . ipratropium-albuterol  3 mL Nebulization BID  . latanoprost  1 drop Left Eye QHS  . letrozole  2.5 mg Oral Daily  . levothyroxine  100 mcg Oral QAC breakfast  . metoprolol tartrate  12.5 mg Oral BID  . pantoprazole  40 mg Oral Daily  . pregabalin  25 mg Oral QHS  . psyllium  1 packet Oral QHS    Continuous Infusions: . meropenem (MERREM) IV 1 g (09/23/19 1007)     LOS: 5 days     Kayleen Memos, MD [facility] Pager [phone]  If 7PM-7AM, please contact night-coverage [email or web address] Password  [email or web address] 09/23/2019, 2:08 PM

## 2019-09-23 NOTE — Progress Notes (Addendum)
Progress Note  Patient Name: Victoria Walsh Date of Encounter: 09/23/2019  Primary Cardiologist: No primary care provider on file.   Subjective   Denies any chest pain or SOB.  Remains in afib with HR 80-150's  Inpatient Medications    Scheduled Meds: . apixaban  5 mg Oral BID  . aspirin EC  81 mg Oral Daily  . atorvastatin  20 mg Oral q1800  . brimonidine  1 drop Left Eye Q12H   And  . timolol  1 drop Left Eye Q12H  . brinzolamide  1 drop Left Eye BID  . Chlorhexidine Gluconate Cloth  6 each Topical Daily  . diltiazem  60 mg Oral Q6H  . docusate sodium  100 mg Oral Daily  . DULoxetine  20 mg Oral QHS  . ipratropium-albuterol  3 mL Nebulization BID  . latanoprost  1 drop Left Eye QHS  . letrozole  2.5 mg Oral Daily  . levothyroxine  100 mcg Oral QAC breakfast  . metoprolol tartrate  12.5 mg Oral BID  . pantoprazole  40 mg Oral Daily  . pregabalin  25 mg Oral QHS  . psyllium  1 packet Oral QHS   Continuous Infusions: . meropenem (MERREM) IV Stopped (09/23/19 0257)   PRN Meds: acetaminophen, acetaminophen, metoprolol tartrate, ondansetron   Vital Signs    Vitals:   09/22/19 2001 09/22/19 2028 09/23/19 0556 09/23/19 0756  BP:  128/64 109/73   Pulse:  81 (!) 110   Resp:  16 20   Temp:  98.4 F (36.9 C) 97.7 F (36.5 C)   TempSrc:   Oral   SpO2: 98% 98% 95% 98%  Weight:      Height:        Intake/Output Summary (Last 24 hours) at 09/23/2019 0841 Last data filed at 09/23/2019 0600 Gross per 24 hour  Intake 566.12 ml  Output 650 ml  Net -83.88 ml   Filed Weights   09/18/19 1457 09/21/19 0426  Weight: 79.9 kg 82.7 kg    Telemetry    Atrial fibrillation - Personally Reviewed  ECG    No new EKG to review - Personally Reviewed  Physical Exam   GEN: Well nourished, well developed in no acute distress HEENT: Normal NECK: No JVD; No carotid bruits LYMPHATICS: No lymphadenopathy CARDIAC:irregularlrly irregular, no murmurs, rubs, gallops RESPIRATORY:   Clear to auscultation without rales, wheezing or rhonchi  ABDOMEN: Soft, non-tender, non-distended MUSCULOSKELETAL:  No edema; No deformity  SKIN: Warm and dry NEUROLOGIC:  Alert and oriented x 3 PSYCHIATRIC:  Normal affect   Labs    Chemistry Recent Labs  Lab 09/18/19 1137 09/19/19 0514 09/22/19 0522 09/22/19 1052 09/23/19 0543  NA 141   < > 143 141 142  K 3.9   < > 3.2* 3.3* 3.5  CL 110   < > 110 111 112*  CO2 19*   < > 18* 21* 20*  GLUCOSE 143*   < > 110* 119* 100*  BUN 31*   < > 25* 27* 25*  CREATININE 1.35*   < > 0.89 0.91 0.81  CALCIUM 8.7*   < > 7.8* 8.1* 8.1*  PROT 6.9  --   --   --   --   ALBUMIN 3.9  --   --   --   --   AST 19  --   --   --   --   ALT 16  --   --   --   --  ALKPHOS 77  --   --   --   --   BILITOT 1.3*  --   --   --   --   GFRNONAA 36*   < > 59* 58* >60  GFRAA 41*   < > >60 >60 >60  ANIONGAP 12   < > 15 9 10    < > = values in this interval not displayed.     Hematology Recent Labs  Lab 09/21/19 0401 09/22/19 0522 09/23/19 0543  WBC 6.7 8.9 8.3  RBC 3.03* 3.06* 2.92*  HGB 9.3* 9.7* 9.0*  HCT 30.7* 30.8* 29.2*  MCV 101.3* 100.7* 100.0  MCH 30.7 31.7 30.8  MCHC 30.3 31.5 30.8  RDW 13.6 13.5 13.3  PLT 136* 133* 151    Cardiac EnzymesNo results for input(s): TROPONINI in the last 168 hours. No results for input(s): TROPIPOC in the last 168 hours.   BNPNo results for input(s): BNP, PROBNP in the last 168 hours.   DDimer No results for input(s): DDIMER in the last 168 hours.   Radiology    DG CHEST PORT 1 VIEW  Result Date: 09/21/2019 CLINICAL DATA:  Wheezing, dementia EXAM: PORTABLE CHEST 1 VIEW COMPARISON:  09/18/2019 FINDINGS: Single frontal view of the chest demonstrates a stable cardiac silhouette. Increasing interstitial prominence with Kerley B-lines consistent with mild interstitial edema. No airspace disease, effusion, or pneumothorax. IMPRESSION: 1. Mild interstitial edema. Electronically Signed   By: Randa Ngo M.D.    On: 09/21/2019 08:53   ECHOCARDIOGRAM COMPLETE  Result Date: 09/21/2019   ECHOCARDIOGRAM REPORT   Patient Name:   [name] Date of Exam: 09/21/2019 Medical Rec #:  [id number]   Height:       68.0 in Accession #:    [id number]  Weight:       182.3 lb Date of Birth:  [birth year]   BSA:          1.96 m Patient Age:    86 years    BP:           108/52 mmHg Patient Gender: F           HR:           105 bpm. Exam Location:  Inpatient Procedure: 2D Echo, Cardiac Doppler and Color Doppler Indications:    I48.0 Paroxysmal atrial fibrillation  History:        Patient has no prior history of Echocardiogram examinations.                 Stroke; Risk Factors:Dyslipidemia. CKD.  Sonographer:    Jonelle Sidle Dance Referring Phys: [id number] [place]  1. Left ventricular ejection fraction, by visual estimation, is 55 to 60%. The left ventricle has normal function. There is no left ventricular hypertrophy.  2. Left ventricular diastolic parameters are indeterminate due to atrial fibrillation.  3. The left ventricle has no regional wall motion abnormalities.  4. Global right ventricle has normal systolic function.The right ventricular size is normal. No increase in right ventricular wall thickness.  5. Left atrial size was normal.  6. Right atrial size was normal.  7. Mild mitral annular calcification.  8. The mitral valve is normal in structure. Mild mitral valve regurgitation. No evidence of mitral stenosis.  9. The tricuspid valve is normal in structure. Tricuspid valve regurgitation is not demonstrated. 10. The aortic valve is normal in structure. Aortic valve regurgitation is not visualized. Mild to moderate aortic valve stenosis. 11. The pulmonic valve was normal  in structure. Pulmonic valve regurgitation is not visualized. 12. Normal pulmonary artery systolic pressure. 13. The inferior vena cava is normal in size with greater than 50% respiratory variability, suggesting right atrial pressure of 3 mmHg. FINDINGS   Left Ventricle: Left ventricular ejection fraction, by visual estimation, is 55 to 60%. The left ventricle has normal function. The left ventricle has no regional wall motion abnormalities. There is no left ventricular hypertrophy. Left ventricular diastolic parameters are indeterminate. Normal left atrial pressure. Right Ventricle: The right ventricular size is normal. No increase in right ventricular wall thickness. Global RV systolic function is has normal systolic function. The tricuspid regurgitant velocity is 2.10 m/s, and with an assumed right atrial pressure  of 3 mmHg, the estimated right ventricular systolic pressure is normal at 20.6 mmHg. Left Atrium: Left atrial size was normal in size. Right Atrium: Right atrial size was normal in size Pericardium: There is no evidence of pericardial effusion. Mitral Valve: The mitral valve is normal in structure. Mild mitral annular calcification. Mild mitral valve regurgitation. No evidence of mitral valve stenosis by observation. Tricuspid Valve: The tricuspid valve is normal in structure. Tricuspid valve regurgitation is not demonstrated. Aortic Valve: The aortic valve is normal in structure.. There is moderate thickening and mild calcification of the aortic valve. Aortic valve regurgitation is not visualized. Aortic regurgitation PHT measures 514 msec. Mild to moderate aortic stenosis is  present. There is moderate thickening of the aortic valve. There is mild calcification of the aortic valve. Pulmonic Valve: The pulmonic valve was normal in structure. Pulmonic valve regurgitation is not visualized. Pulmonic regurgitation is not visualized. Aorta: The aortic root, ascending aorta and aortic arch are all structurally normal, with no evidence of dilitation or obstruction. Venous: The inferior vena cava is normal in size with greater than 50% respiratory variability, suggesting right atrial pressure of 3 mmHg. IAS/Shunts: No atrial level shunt detected by color flow  Doppler. There is no evidence of a patent foramen ovale. No ventricular septal defect is seen or detected. There is no evidence of an atrial septal defect.  LEFT VENTRICLE PLAX 2D LVIDd:         3.80 cm LVIDs:         3.10 cm LV PW:         1.20 cm LV IVS:        1.00 cm LVOT diam:     1.80 cm LV SV:         24 ml LV SV Index:   11.96 LVOT Area:     2.54 cm  RIGHT VENTRICLE             IVC RV Basal diam:  2.80 cm     IVC diam: 1.70 cm RV S prime:     10.30 cm/s TAPSE (M-mode): 1.9 cm LEFT ATRIUM             Index       RIGHT ATRIUM           Index LA diam:        3.40 cm 1.73 cm/m  RA Area:     12.00 cm LA Vol (A2C):   41.6 ml 21.17 ml/m RA Volume:   23.80 ml  12.11 ml/m LA Vol (A4C):   48.0 ml 24.43 ml/m LA Biplane Vol: 46.7 ml 23.77 ml/m  AORTIC VALVE LVOT Vmax:   86.60 cm/s LVOT Vmean:  55.700 cm/s LVOT VTI:    0.159 m AI PHT:  514 msec  AORTA Ao Root diam: 3.70 cm Ao Asc diam:  3.60 cm MITRAL VALVE                        TRICUSPID VALVE MV Area (PHT): 3.75 cm             TR Peak grad:   17.6 mmHg MV PHT:        58.72 msec           TR Vmax:        210.00 cm/s MV Decel Time: 203 msec MV E velocity: 109.00 cm/s 103 cm/s SHUNTS                                     Systemic VTI:  0.16 m                                     Systemic Diam: 1.80 cm  Sanda Klein MD Electronically signed by Sanda Klein MD Signature Date/Time: 09/21/2019/4:43:21 PM    Final     Cardiac Studies   2D echo 09/2019  IMPRESSIONS    1. Left ventricular ejection fraction, by visual estimation, is 55 to  60%. The left ventricle has normal function. There is no left ventricular  hypertrophy.  2. Left ventricular diastolic parameters are indeterminate due to atrial  fibrillation.  3. The left ventricle has no regional wall motion abnormalities.  4. Global right ventricle has normal systolic function.The right  ventricular size is normal. No increase in right ventricular wall  thickness.  5. Left atrial size was  normal.  6. Right atrial size was normal.  7. Mild mitral annular calcification.  8. The mitral valve is normal in structure. Mild mitral valve  regurgitation. No evidence of mitral stenosis.  9. The tricuspid valve is normal in structure. Tricuspid valve  regurgitation is not demonstrated.  10. The aortic valve is normal in structure. Aortic valve regurgitation is  not visualized. Mild to moderate aortic valve stenosis.  11. The pulmonic valve was normal in structure. Pulmonic valve  regurgitation is not visualized.  12. Normal pulmonary artery systolic pressure.  13. The inferior vena cava is normal in size with greater than 50%  respiratory variability, suggesting right atrial pressure of 3 mmHg.   Patient Profile     86 y.o. 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 Nevada Crane.  Assessment & Plan    1.  Atrial fibrillation with RVR -HR for most part in the 80's but jumps into the 150's at times -increase Cardizem to 60mg  q6 hours -2D echo with normal LVF and normal LA size -CHADS2VASC score is 6 but has been high risk for anticoagulation in the past due to multiple falls -TSH normal at 1.04 -continue Lopressor 12.5mg  BID and Apixaban 5mg  BID  2.  Sepsis/bacteremia  -secondary to E Coli UTI with chronic foley -per TRH  3.  Hx of remote CVA -on coumadin but in 2013 and 2014 was on  ASA and plavix.  - between 2014 and 2016 was placed on xarelto and this was stopped sometime after 04/2019.   -She did have several falls which may have led to her stopping.  -given high CHADS2VASC score and hx of CVA now started on Eliquis in setting of afib  4.  HTN -controlled at 109/73 -  128/66mmhg -continue Lopressor and Cardizem  5.  AKI -secondary to dehydration and UTI -creatinine 0.91 yesterday and 0.81 today  6.  Hypokalemia -K+ 3.3 yesterday and 3.5 today -replacement per TRH once BMET back today -need to keep K+>4    For questions or updates, please contact [place] Please consult [email or web address] for contact info under Cardiology/STEMI.      Signed, Fransico Him, MD  09/23/2019, 8:41 AM

## 2019-09-24 DIAGNOSIS — I480 Paroxysmal atrial fibrillation: Secondary | ICD-10-CM

## 2019-09-24 LAB — FOLATE RBC
Folate, Hemolysate: 458 ng/mL
Folate, RBC: UNDETERMINED ng/mL

## 2019-09-24 LAB — CBC WITH DIFFERENTIAL/PLATELET
Abs Immature Granulocytes: 0.05 10*3/uL (ref 0.00–0.07)
Basophils Absolute: 0 10*3/uL (ref 0.0–0.1)
Basophils Relative: 0 %
Eosinophils Absolute: 0.3 10*3/uL (ref 0.0–0.5)
Eosinophils Relative: 4 %
HCT: 27.7 % — ABNORMAL LOW (ref 36.0–46.0)
Hemoglobin: 8.8 g/dL — ABNORMAL LOW (ref 12.0–15.0)
Immature Granulocytes: 1 %
Lymphocytes Relative: 16 %
Lymphs Abs: 1.3 10*3/uL (ref 0.7–4.0)
MCH: 31.4 pg (ref 26.0–34.0)
MCHC: 31.8 g/dL (ref 30.0–36.0)
MCV: 98.9 fL (ref 80.0–100.0)
Monocytes Absolute: 0.6 10*3/uL (ref 0.1–1.0)
Monocytes Relative: 8 %
Neutro Abs: 6 10*3/uL (ref 1.7–7.7)
Neutrophils Relative %: 71 %
Platelets: 164 10*3/uL (ref 150–400)
RBC: 2.8 MIL/uL — ABNORMAL LOW (ref 3.87–5.11)
RDW: 13.1 % (ref 11.5–15.5)
WBC: 8.3 10*3/uL (ref 4.0–10.5)
nRBC: 0 % (ref 0.0–0.2)

## 2019-09-24 LAB — BASIC METABOLIC PANEL
Anion gap: 7 (ref 5–15)
BUN: 23 mg/dL (ref 8–23)
CO2: 22 mmol/L (ref 22–32)
Calcium: 7.7 mg/dL — ABNORMAL LOW (ref 8.9–10.3)
Chloride: 109 mmol/L (ref 98–111)
Creatinine, Ser: 0.93 mg/dL (ref 0.44–1.00)
GFR calc Af Amer: >60 mL/min (ref >60)
GFR calc non Af Amer: 56 mL/min — ABNORMAL LOW (ref >60)
Glucose, Bld: 101 mg/dL — ABNORMAL HIGH (ref 70–99)
Potassium: 3.1 mmol/L — ABNORMAL LOW (ref 3.5–5.1)
Sodium: 138 mmol/L (ref 135–145)

## 2019-09-24 LAB — IRON AND TIBC
Iron: 43 ug/dL (ref 28–170)
Saturation Ratios: 25 % (ref 10.4–31.8)
TIBC: 171 ug/dL — ABNORMAL LOW (ref 250–450)
UIBC: 128 ug/dL

## 2019-09-24 LAB — FERRITIN: Ferritin: 330 ng/mL — ABNORMAL HIGH (ref 11–307)

## 2019-09-24 LAB — VITAMIN B12: Vitamin B-12: 373 pg/mL (ref 180–914)

## 2019-09-24 LAB — SARS CORONAVIRUS 2 (TAT 6-24 HRS): SARS Coronavirus 2: NEGATIVE

## 2019-09-24 MED ORDER — DILTIAZEM HCL 30 MG PO TABS: 30.0000 mg | ORAL_TABLET | Freq: Four times a day (QID) | ORAL | Status: DC

## 2019-09-24 MED ORDER — DILTIAZEM HCL ER COATED BEADS 180 MG PO CP24
180.0000 mg | ORAL_CAPSULE | Freq: Every day | ORAL | Status: DC
  Administered 2019-09-24 – 2019-09-25 (×2): 180 mg via ORAL
  Filled 2019-09-24 (×3): qty 1

## 2019-09-24 MED ORDER — POTASSIUM CHLORIDE CRYS ER 20 MEQ PO TBCR
40.0000 meq | EXTENDED_RELEASE_TABLET | Freq: Every day | ORAL | Status: DC
  Administered 2019-09-25: 10:00:00 40 meq via ORAL
  Filled 2019-09-24: qty 2

## 2019-09-24 MED ORDER — POTASSIUM CHLORIDE CRYS ER 20 MEQ PO TBCR
40.0000 meq | EXTENDED_RELEASE_TABLET | Freq: Two times a day (BID) | ORAL | Status: AC
  Administered 2019-09-24 (×2): 40 meq via ORAL
  Filled 2019-09-24 (×2): qty 2

## 2019-09-24 MED ORDER — IPRATROPIUM-ALBUTEROL 0.5-2.5 (3) MG/3ML IN SOLN: 3.0000 mL | RESPIRATORY_TRACT | Status: DC | PRN

## 2019-09-24 NOTE — Progress Notes (Signed)
ANTICOAGULATION CONSULT NOTE - Initial Consult  Pharmacy Consult for apixaban Indication: Atrial fibrillation  Allergies  Allergen Reactions  . Ciprofloxacin Other (See Comments)    On MAR  . Oxaprozin Other (See Comments)    DAYPRO (SULFA DRUG) On MAR  . Sulfa Antibiotics Other (See Comments)    On MAR    Patient Measurements: Height: 5\' 8"  (172.7 cm) Weight: 182 lb 5.1 oz (82.7 kg) IBW/kg (Calculated) : 63.9  Vital Signs: BP: 110/49 (02/07 2356) Pulse Rate: 62 (02/07 2356)  Labs: Recent Labs    09/22/19 0522 09/22/19 0522 09/22/19 1052 09/23/19 0543 09/24/19 0355  HGB 9.7*   < >  --  9.0* 8.8*  HCT 30.8*  --   --  29.2* 27.7*  PLT 133*  --   --  151 164  CREATININE 0.89   < > 0.91 0.81 0.93   < > = values in this interval not displayed.    Estimated Creatinine Clearance: 49.8 mL/min (by C-G formula based on SCr of 0.93 mg/dL).   Medical History: Past Medical History:  Diagnosis Date  . Chronic kidney disease   . CVA (cerebral infarction)   . Dementia ([place])   . Hyperlipidemia   . Osteopenia   . UTI (lower urinary tract infection)     Assessment: 86 yo F sent from SNF to ED with fever and AMS on 09/18/19.  Patient being seen by cardiology during admission for atrial fibrillation. CHADS2VASC score 6, history of CVA; patient started on apixaban for atrial fibrillation. Pharmacy consulted to assist with dosing.   Today, 09/24/19  CBC: Hgb (8.8) remains low but relatively stable compared to yesterday. Plt - WNL.   Per MD note, no signs of overt bleeding.   Age > 20, Wt >60 kg, SCr <1.5  Pt also on ASA 81 mg PO daily for hx CVA   Plan:   Continue apixaban 5 mg PO BID for atrial fibrillation  Education provided to patients son via telephone on 2/8  Continue to monitor CBC closely  Lenis Noon, PharmD

## 2019-09-24 NOTE — TOC Progression Note (Signed)
Transition of Care [facility]) - Progression Note    Patient Details  Name: Kaylina Urso MRN: [id number] Date of Birth: 05-14-1932  Transition of Care [facility]) CM/SW Contact  Diyari Cherne, [name], RN Phone Number: 09/24/2019, 1:11 PM  Clinical Narrative: covid ordered today.TC to [facility] memory care to update on possible dc back tomorrow if stable-await call back.      Expected Discharge Plan: Memory Care Barriers to Discharge: Continued Medical Work up  Expected Discharge Plan and Services Expected Discharge Plan: Memory Care   Discharge Planning Services: CM Consult   Living arrangements for the past 2 months: Assisted Living Facility                                       Social Determinants of Health (SDOH) Interventions    Readmission Risk Interventions Readmission Risk Prevention Plan 09/19/2019  Transportation Screening Complete  PCP or Specialist Appt within 3-5 Days Complete  HRI or [place] Complete  Social Work Consult for [place] Planning/Counseling Complete  Palliative Care Screening Complete  Medication Review [employer]) Complete  Some recent data might be hidden

## 2019-09-24 NOTE — Progress Notes (Signed)
Progress Note  Patient Name: Victoria Walsh Date of Encounter: 09/24/2019  Primary Cardiologist:   No primary care provider on file.   Subjective   Very confused.  Denies pain or SOB.   Inpatient Medications    Scheduled Meds: . apixaban  5 mg Oral BID  . aspirin EC  81 mg Oral Daily  . atorvastatin  20 mg Oral q1800  . brimonidine  1 drop Left Eye Q12H   And  . timolol  1 drop Left Eye Q12H  . brinzolamide  1 drop Left Eye BID  . Chlorhexidine Gluconate Cloth  6 each Topical Daily  . diltiazem  30 mg Oral Q6H  . docusate sodium  100 mg Oral Daily  . DULoxetine  20 mg Oral QHS  . latanoprost  1 drop Left Eye QHS  . letrozole  2.5 mg Oral Daily  . levothyroxine  100 mcg Oral QAC breakfast  . metoprolol tartrate  12.5 mg Oral BID  . pantoprazole  40 mg Oral Daily  . potassium chloride  40 mEq Oral BID  . [START ON 09/25/2019] potassium chloride  40 mEq Oral Daily  . pregabalin  25 mg Oral QHS  . psyllium  1 packet Oral QHS  . sodium chloride flush  10-40 mL Intracatheter Q12H   Continuous Infusions: . meropenem (MERREM) IV 1 g (09/24/19 1101)   PRN Meds: acetaminophen, acetaminophen, ipratropium-albuterol, metoprolol tartrate, ondansetron, sodium chloride flush   Vital Signs    Vitals:   09/23/19 2013 09/23/19 2027 09/23/19 2356 09/24/19 1219  BP:  (!) 111/57 (!) 110/49 140/64  Pulse:  65 62 73  Resp:  18  18  Temp:  98.6 F (37 C)  98.1 F (36.7 C)  TempSrc:  Oral  Oral  SpO2: 98% 96% 97% 90%  Weight:      Height:        Intake/Output Summary (Last 24 hours) at 09/24/2019 1327 Last data filed at 09/24/2019 0600 Gross per 24 hour  Intake 720 ml  Output 800 ml  Net -80 ml   Filed Weights   09/18/19 1457 09/21/19 0426  Weight: 79.9 kg 82.7 kg    Telemetry    NSR - Personally Reviewed  ECG    NA - Personally Reviewed  Physical Exam   GEN: No acute distress.   Neck: No  JVD Cardiac: RRR, no murmurs, rubs, or gallops.  Respiratory: Clear  to  auscultation bilaterally. GI: Soft, nontender, non-distended  MS: No  edema; No deformity. Neuro:  Nonfocal  Psych:   Confused but pleasant.   Labs    Chemistry Recent Labs  Lab 09/18/19 1137 09/19/19 0514 09/22/19 1052 09/23/19 0543 09/24/19 0355  NA 141   < > 141 142 138  K 3.9   < > 3.3* 3.5 3.1*  CL 110   < > 111 112* 109  CO2 19*   < > 21* 20* 22  GLUCOSE 143*   < > 119* 100* 101*  BUN 31*   < > 27* 25* 23  CREATININE 1.35*   < > 0.91 0.81 0.93  CALCIUM 8.7*   < > 8.1* 8.1* 7.7*  PROT 6.9  --   --   --   --   ALBUMIN 3.9  --   --   --   --   AST 19  --   --   --   --   ALT 16  --   --   --   --  ALKPHOS 77  --   --   --   --   BILITOT 1.3*  --   --   --   --   GFRNONAA 36*   < > 58* >60 56*  GFRAA 41*   < > >60 >60 >60  ANIONGAP 12   < > 9 10 7    < > = values in this interval not displayed.     Hematology Recent Labs  Lab 09/22/19 0522 09/23/19 0543 09/24/19 0355  WBC 8.9 8.3 8.3  RBC 3.06* 2.92* 2.80*  HGB 9.7* 9.0* 8.8*  HCT 30.8* 29.2* 27.7*  MCV 100.7* 100.0 98.9  MCH 31.7 30.8 31.4  MCHC 31.5 30.8 31.8  RDW 13.5 13.3 13.1  PLT 133* 151 164    Cardiac EnzymesNo results for input(s): TROPONINI in the last 168 hours. No results for input(s): TROPIPOC in the last 168 hours.   BNPNo results for input(s): BNP, PROBNP in the last 168 hours.   DDimer No results for input(s): DDIMER in the last 168 hours.   Radiology    No results found.  Cardiac Studies   2D echo 09/2019  IMPRESSIONS    1. Left ventricular ejection fraction, by visual estimation, is 55 to  60%. The left ventricle has normal function. There is no left ventricular  hypertrophy.  2. Left ventricular diastolic parameters are indeterminate due to atrial  fibrillation.  3. The left ventricle has no regional wall motion abnormalities.  4. Global right ventricle has normal systolic function.The right  ventricular size is normal. No increase in right ventricular wall    thickness.  5. Left atrial size was normal.  6. Right atrial size was normal.  7. Mild mitral annular calcification.  8. The mitral valve is normal in structure. Mild mitral valve  regurgitation. No evidence of mitral stenosis.  9. The tricuspid valve is normal in structure. Tricuspid valve  regurgitation is not demonstrated.  10. The aortic valve is normal in structure. Aortic valve regurgitation is  not visualized. Mild to moderate aortic valve stenosis.  11. The pulmonic valve was normal in structure. Pulmonic valve  regurgitation is not visualized.  12. Normal pulmonary artery systolic pressure.  13. The inferior vena cava is normal in size with greater than 50%  respiratory variability, suggesting right atrial pressure of 3 mmHg.    Patient Profile     86 y.o. 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 Nevada Crane.   Assessment & Plan    PAF:  Ms. Victoria Walsh has a CHA2DS2 - VASc score of 6.  On Eliquis.     Now back in NSR.   Change to Cardizem CD   HTN:  BP OK.    HYPOKALEMIA:  Supplementing with 80 mg Kdur today.  Repeat BMET in the AM.    For questions or updates, please contact [place] Please consult [email or web address] for contact info under Cardiology/STEMI.   Signed, Minus Breeding, MD  09/24/2019, 1:27 PM

## 2019-09-24 NOTE — Progress Notes (Signed)
PROGRESS NOTE  Victoria Walsh [id number] DOB: 16-Nov-1931 DOA: 09/18/2019 PCP: Reymundo Poll, MD  HPI/Recap of past 24 hours: Victoria Walsh is a 86 y.o. 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  Due to patient mental status, all information was taken from the son on phone.  Patient has neurogenic bladder from stroke so she has a chronic indwelling Foley catheter.  Facility reported that the catheter stopped draining.  When she presented here she had fever of 102 F, tachycardic.  Foley catheter was very dirty and it was not draining urine.  Her blood pressure was stable on presentation.  She has history of ESBL E. coli UTI.  Presentation to the ED positive UA and urine culture greater than 100,000 colonies of ESBL E. coli.  ESBL E. coli bacteremia.  She is on meropenem.  Hospital course complicated by A. fib with RVR on 09/21/2019.  No prior history of A. fib.  Cardiology consulted.  Was placed on diltiazem drip with improvement of heart rate.  2D echo showed normal LVEF.  09/24/19: Seen and examined.  Reported soft blood pressures and bradycardia overnight.  Cardiac medications held overnight, Cardizem dose reduced to 30 mg QID.  She has no new complaints.  Cardiology following.  Anticipate DC to memory care once cardiology signs off.   Assessment/Plan: Principal Problem:   Sepsis ([place]) Active Problems:   Acute lower UTI   AKI (acute kidney injury) ([place])   Dementia with behavioral disturbance (HCC)   Ischemic stroke ([place])   Hypothyroidism   Breast cancer ([place])   Atrial fibrillation with RVR (HCC)   Chronic diastolic CHF (congestive heart failure) (HCC)   Coronary artery disease involving native coronary artery of native heart without angina  pectoris   Essential hypertension  Resolving sepsis secondary to recurrent ESBL E. Coli bacteremia likely urinary source from ESBL E. coli UTI ESBL E. coli UTI, ESBL E.  Coli bacteremia. Repeated blood cultures x2 - to date. Sepsis criteria is improving, no leukocytosis and afebrile. Day #7 of meropenem.  Recommended 7 to 14 days of IV meropenem for ESBL E. coli bacteremia.  Newly diagnosed A. fib with RVR TSH normal. CHADSVASC score 6 On Eliquis for secondary CVA prevention. Continue Eliquis 5 mg twice daily. 2D echo showed normal LVEF. Continue Lopressor 12.5 mg twice daily, p.o. Cardizem 30 mg every 6 hours. Rate is controlled on this regimen.   Obtain twelve-lead EKG. Appreciate cardiology's assistance.  Soft blood pressure/bradycardia In the setting of cardiac medications Currently on metoprolol 12.5 mg twice daily Most home diltiazem p.o. 60 mg 4 times daily, dose reduced to 30 mg twice daily. Continue to closely monitor vital signs. Cardiology following.  Resolving AKI likely prerenal in the setting of dehydration from poor oral intake Baseline creatinine appears to be 0.8 with GFR greater than 60 Presented with creatinine of 1.3 with GFR of 37. Creatinine 0.9 with GFR of 56. Continue to avoid nephrotoxins and hypotension Continue to monitor urine output BMPs  Hypokalemia Potassium 3.1 Repleted with oral potassium 40 mEq x 2 doses Repeat serum potassium level.  Chronic macrocytic anemia with mild drop in hemoglobin from 9.7 to 9.0. MCV 100 Baseline hemoglobin appears to be 11 No sign of overt bleeding Negative FOBT Continue to monitor H&H  Hx of ischemic CVA C/w ASA LDL  68 Continue Lipitor 20 mg nightly Continue Eliquis for secondary CVA prevention.  Dementia Reorient as needed Fall precautions Coming from memory care.  Hypothyroidism Continue Synthroid Tsh normal  GERD Continue PPI  History of left breast cancer  Continue letrozole   DVT  prophylaxis: Lovenox subcu daily. Code Status: Full code Family Communication:  None at bedside. Consults called:  Cardiology.  Disposition: Patient is from SNF.  Anticipate discharge to SNF/memory care.  Barrier to discharge adjustment of cardiac medications.   Objective: Vitals:   09/23/19 2013 09/23/19 2027 09/23/19 2356 09/24/19 1219  BP:  (!) 111/57 (!) 110/49 140/64  Pulse:  65 62 73  Resp:  18  18  Temp:  98.6 F (37 C)  98.1 F (36.7 C)  TempSrc:  Oral  Oral  SpO2: 98% 96% 97% 90%  Weight:      Height:        Intake/Output Summary (Last 24 hours) at 09/24/2019 1243 Last data filed at 09/24/2019 0600 Gross per 24 hour  Intake 840 ml  Output 800 ml  Net 40 ml   Filed Weights   09/18/19 1457 09/21/19 0426  Weight: 79.9 kg 82.7 kg    Exam:  . General: 86 y.o. year-old female pleasantly demented no acute distress.  Alert and interactive.   Cardiovascular: Irregular rate and rhythm no rubs or gallops.   Respiratory: Clear to auscultation no wheezes or rales.   Abdomen: Obese normal bowel sounds present.  Nontender. Musculoskeletal: Trace lower extremity edema bilaterally.   Psychiatry: Mood is appropriate for condition and setting.  Data Reviewed: CBC: Recent Labs  Lab 09/20/19 0500 09/21/19 0401 09/22/19 0522 09/23/19 0543 09/24/19 0355  WBC 8.8 6.7 8.9 8.3 8.3  NEUTROABS 7.5 5.2 6.9 6.3 6.0  HGB 8.6* 9.3* 9.7* 9.0* 8.8*  HCT 28.1* 30.7* 30.8* 29.2* 27.7*  MCV 104.1* 101.3* 100.7* 100.0 98.9  PLT 132* 136* 133* 151 [id number]   Basic Metabolic Panel: Recent Labs  Lab 09/21/19 0401 09/22/19 0522 09/22/19 1052 09/23/19 0543 09/24/19 0355  NA 143 143 141 142 138  K 3.6 3.2* 3.3* 3.5 3.1*  CL 120* 110 111 112* 109  CO2 18* 18* 21* 20* 22  GLUCOSE 104* 110* 119* 100* 101*  BUN 26* 25* 27* 25* 23  CREATININE 1.00 0.89 0.91 0.81 0.93  CALCIUM 8.0* 7.8* 8.1* 8.1* 7.7*  MG  --  1.8  --   --   --   PHOS  --  2.3*  --   --   --    GFR: Estimated Creatinine  Clearance: 49.8 mL/min (by C-G formula based on SCr of 0.93 mg/dL). Liver Function Tests: Recent Labs  Lab 09/18/19 1137  AST 19  ALT 16  ALKPHOS 77  BILITOT 1.3*  PROT 6.9  ALBUMIN 3.9   No results for input(s): LIPASE, AMYLASE in the last 168 hours. No results for input(s): AMMONIA in the last 168 hours. Coagulation Profile: Recent Labs  Lab 09/18/19 1137  INR 1.1   Cardiac Enzymes: No results for input(s): CKTOTAL, CKMB, CKMBINDEX, TROPONINI in the last 168 hours. BNP (last 3 results) No results for input(s): PROBNP in the last 8760 hours. HbA1C: No results for input(s): HGBA1C in the last 72 hours. CBG: No results for input(s): GLUCAP in the last 168 hours. Lipid Profile: Recent Labs    09/21/19 1607  CHOL 123  HDL 20*  LDLCALC 68  TRIG 173*  CHOLHDL 6.2   Thyroid Function Tests: No results for input(s):  TSH, T4TOTAL, FREET4, T3FREE, THYROIDAB in the last 72 hours. Anemia Panel: Recent Labs    09/21/19 1607 09/24/19 0623  VITAMINB12 275 373  FERRITIN  --  330*  TIBC  --  171*  IRON  --  43   Urine analysis:    Component Value Date/Time   COLORURINE YELLOW 09/18/2019 1138   APPEARANCEUR TURBID (A) 09/18/2019 1138   LABSPEC 1.013 09/18/2019 1138   PHURINE 6.0 09/18/2019 1138   GLUCOSEU NEGATIVE 09/18/2019 1138   HGBUR SMALL (A) 09/18/2019 1138   BILIRUBINUR NEGATIVE 09/18/2019 1138   KETONESUR NEGATIVE 09/18/2019 1138   PROTEINUR 100 (A) 09/18/2019 1138   NITRITE POSITIVE (A) 09/18/2019 1138   LEUKOCYTESUR LARGE (A) 09/18/2019 1138   Sepsis Labs: @LABRCNTIP (procalcitonin:4,lacticidven:4)  ) Recent Results (from the past 240 hour(s))  Blood Culture (routine x 2)     Status: Abnormal   Collection Time: 09/18/19 11:38 AM   Specimen: BLOOD  Result Value Ref Range Status   Specimen Description   Final    BLOOD LEFT ARM Performed at [facility], [place] [address] [place], [place] [address]    Special Requests   Final     BOTTLES DRAWN AEROBIC AND ANAEROBIC Blood Culture adequate volume Performed at [place] [address], [place], [place] [address]    Culture  Setup Time   Final    GRAM NEGATIVE RODS AEROBIC BOTTLE ONLY CRITICAL RESULT CALLED TO, READ BACK BY AND VERIFIED WITH: [place]. [id number] FCP Performed at [facility], [place] [address] [place], [place] [address]    Culture (A)  Final    ESCHERICHIA COLI Confirmed Extended Spectrum Beta-Lactamase Producer (ESBL).  In bloodstream infections from ESBL organisms, carbapenems are preferred over piperacillin/tazobactam. They are shown to have a lower risk of mortality.    Report Status 09/21/2019 FINAL  Final   Organism ID, Bacteria ESCHERICHIA COLI  Final      Susceptibility   Escherichia coli - MIC*    AMPICILLIN >=32 RESISTANT Resistant     CEFAZOLIN >=64 RESISTANT Resistant     CEFEPIME >=32 RESISTANT Resistant     CEFTAZIDIME RESISTANT Resistant     CEFTRIAXONE >=64 RESISTANT Resistant     CIPROFLOXACIN >=4 RESISTANT Resistant     GENTAMICIN <=1 SENSITIVE Sensitive     IMIPENEM <=0.25 SENSITIVE Sensitive     TRIMETH/SULFA >=320 RESISTANT Resistant     AMPICILLIN/SULBACTAM >=32 RESISTANT Resistant     PIP/TAZO <=4 SENSITIVE Sensitive     * ESCHERICHIA COLI  Blood Culture (routine x 2)     Status: None   Collection Time: 09/18/19 11:38 AM   Specimen: BLOOD  Result Value Ref Range Status   Specimen Description   Final    BLOOD RIGHT ARM Performed at [place] [address], [place], [place] [address]    Special Requests   Final    BOTTLES DRAWN AEROBIC ONLY Blood Culture results may not be optimal due to an inadequate volume of blood received in culture bottles Performed at [place] [address] [place], [place] [address]    Culture   Final    NO GROWTH 5 DAYS Performed at [facility], [place] [address], [place],  [address]    Report  Status 09/23/2019 FINAL  Final  Urine culture     Status: Abnormal   Collection Time: 09/18/19 11:38 AM   Specimen: In/Out Cath Urine  Result  Value Ref Range Status   Specimen Description   Final    IN/OUT CATH URINE Performed at [facility] [address] [place], [place] [address]    Special Requests   Final    NONE Performed at [facility], [place] [address] [place], [place] [address]    Culture (A)  Final    >=100,000 COLONIES/mL ESCHERICHIA COLI Confirmed Extended Spectrum Beta-Lactamase Producer (ESBL).  In bloodstream infections from ESBL organisms, carbapenems are preferred over piperacillin/tazobactam. They are shown to have a lower risk of mortality.    Report Status 09/20/2019 FINAL  Final   Organism ID, Bacteria ESCHERICHIA COLI (A)  Final      Susceptibility   Escherichia coli - MIC*    AMPICILLIN >=32 RESISTANT Resistant     CEFAZOLIN >=64 RESISTANT Resistant     CEFTRIAXONE >=64 RESISTANT Resistant     CIPROFLOXACIN >=4 RESISTANT Resistant     GENTAMICIN <=1 SENSITIVE Sensitive     IMIPENEM <=0.25 SENSITIVE Sensitive     NITROFURANTOIN <=16 SENSITIVE Sensitive     TRIMETH/SULFA >=320 RESISTANT Resistant     AMPICILLIN/SULBACTAM >=32 RESISTANT Resistant     PIP/TAZO <=4 SENSITIVE Sensitive     * >=100,000 COLONIES/mL ESCHERICHIA COLI  Blood Culture ID Panel (Reflexed)     Status: Abnormal   Collection Time: 09/18/19 11:38 AM  Result Value Ref Range Status   Enterococcus species NOT DETECTED NOT DETECTED Final   Listeria monocytogenes NOT DETECTED NOT DETECTED Final   Staphylococcus species NOT DETECTED NOT DETECTED Final   Staphylococcus aureus (BCID) NOT DETECTED NOT DETECTED Final   Streptococcus species NOT DETECTED NOT DETECTED Final   Streptococcus agalactiae NOT DETECTED NOT DETECTED Final   Streptococcus pneumoniae NOT DETECTED NOT DETECTED Final   Streptococcus pyogenes NOT DETECTED NOT DETECTED Final    Acinetobacter baumannii NOT DETECTED NOT DETECTED Final   Enterobacteriaceae species DETECTED (A) NOT DETECTED Final    Comment: Enterobacteriaceae represent a large family of gram-negative bacteria, not a single organism. CRITICAL RESULT CALLED TO, READ BACK BY AND VERIFIED WITH: PHARMD DREW WOFFORD [id number] FCP    Enterobacter cloacae complex NOT DETECTED NOT DETECTED Final   Escherichia coli DETECTED (A) NOT DETECTED Final    Comment: CRITICAL RESULT CALLED TO, READ BACK BY AND VERIFIED WITH: PHARMD DREW WOFFORD [id number] FCP    Klebsiella oxytoca NOT DETECTED NOT DETECTED Final   Klebsiella pneumoniae NOT DETECTED NOT DETECTED Final   Proteus species NOT DETECTED NOT DETECTED Final   Serratia marcescens NOT DETECTED NOT DETECTED Final   Carbapenem resistance NOT DETECTED NOT DETECTED Final   Haemophilus influenzae NOT DETECTED NOT DETECTED Final   Neisseria meningitidis NOT DETECTED NOT DETECTED Final   Pseudomonas aeruginosa NOT DETECTED NOT DETECTED Final   Candida albicans NOT DETECTED NOT DETECTED Final   Candida glabrata NOT DETECTED NOT DETECTED Final   Candida krusei NOT DETECTED NOT DETECTED Final   Candida parapsilosis NOT DETECTED NOT DETECTED Final   Candida tropicalis NOT DETECTED NOT DETECTED Final    Comment: Performed at [facility], [place] [address], [place], [place] [address]  Respiratory Panel by RT PCR (Flu A&B, Covid) - Nasopharyngeal Swab     Status: None   Collection Time: 09/18/19  1:20 PM   Specimen: Nasopharyngeal Swab  Result Value Ref Range Status   SARS Coronavirus 2 by RT PCR NEGATIVE NEGATIVE Final    Comment: (NOTE) SARS-CoV-2 target nucleic acids are NOT DETECTED.  The SARS-CoV-2 RNA is generally detectable in upper respiratoy specimens during the acute phase of infection. The lowest concentration of SARS-CoV-2 viral copies this assay can detect is 131 copies/mL. A negative result does not preclude SARS-Cov-2 infection and should not be  used as the sole basis for treatment or other patient management decisions. A negative result may occur with  improper specimen collection/handling, submission of specimen other than nasopharyngeal swab, presence of viral mutation(s) within the areas targeted by this assay, and inadequate number of viral copies (<131 copies/mL). A negative result must be combined with clinical observations, patient history, and epidemiological information. The expected result is Negative. Fact Sheet for Patients:  PinkCheek.be Fact Sheet for Healthcare Providers:  GravelBags.it This test is not yet ap proved or cleared by the [place] FDA and  has been authorized for detection and/or diagnosis of SARS-CoV-2 by FDA under an Emergency Use Authorization (EUA). This EUA will remain  in effect (meaning this test can be used) for the duration of the COVID-19 declaration under Section 564(b)(1) of the Act, 21 U.S.C. section 360bbb-3(b)(1), unless the authorization is terminated or revoked sooner.    Influenza A by PCR NEGATIVE NEGATIVE Final   Influenza B by PCR NEGATIVE NEGATIVE Final    Comment: (NOTE) The Xpert Xpress SARS-CoV-2/FLU/RSV assay is intended as an aid in  the diagnosis of influenza from Nasopharyngeal swab specimens and  should not be used as a sole basis for treatment. Nasal washings and  aspirates are unacceptable for Xpert Xpress SARS-CoV-2/FLU/RSV  testing. Fact Sheet for Patients: PinkCheek.be Fact Sheet for Healthcare Providers: GravelBags.it This test is not yet approved or cleared by the [place] FDA and  has been authorized for detection and/or diagnosis of SARS-CoV-2 by  FDA under an Emergency Use Authorization (EUA). This EUA will remain  in effect (meaning this test can be used) for the duration of the  Covid-19 declaration under Section 564(b)(1) of the  Act, 21  U.S.C. section 360bbb-3(b)(1), unless the authorization is  terminated or revoked. Performed at [facility], [place] [address], [place], [place] [address]   Culture, blood (routine x 2)     Status: None (Preliminary result)   Collection Time: 09/20/19  5:03 AM   Specimen: BLOOD  Result Value Ref Range Status   Specimen Description   Final    BLOOD RIGHT ANTECUBITAL Performed at [facility] [address], [place], [place] [address]    Special Requests   Final    BOTTLES DRAWN AEROBIC AND ANAEROBIC Blood Culture adequate volume Performed at [place] [address], [place], [place] [address]    Culture   Final    NO GROWTH 3 DAYS Performed at [facility], [place] [address], [place], [place] [address]    Report Status PENDING  Incomplete  Culture, blood (routine x 2)     Status: None (Preliminary result)   Collection Time: 09/20/19  5:03 AM   Specimen: BLOOD RIGHT HAND  Result Value Ref Range Status   Specimen Description   Final    BLOOD RIGHT HAND Performed at [facility] [address], [place], [place] [address]    Special Requests   Final    BOTTLES DRAWN AEROBIC ONLY Blood Culture adequate volume Performed at [facility] [address] [place], [place] [address]    Culture   Final    NO GROWTH 3 DAYS Performed at [facility],  [address], [place], [place] [address]    Report Status PENDING  Incomplete      Studies: No results found.  Scheduled Meds: . apixaban  5 mg Oral BID  . aspirin EC  81 mg Oral Daily  . atorvastatin  20 mg Oral q1800  . brimonidine  1 drop Left Eye Q12H   And  . timolol  1 drop Left Eye Q12H  . brinzolamide  1 drop Left Eye BID  . Chlorhexidine Gluconate Cloth  6 each Topical Daily  . diltiazem  60 mg Oral Q6H  . docusate sodium  100 mg Oral Daily  . DULoxetine  20 mg Oral QHS  . latanoprost  1 drop Left  Eye QHS  . letrozole  2.5 mg Oral Daily  . levothyroxine  100 mcg Oral QAC breakfast  . metoprolol tartrate  12.5 mg Oral BID  . pantoprazole  40 mg Oral Daily  . potassium chloride  40 mEq Oral BID  . [START ON 09/25/2019] potassium chloride  40 mEq Oral Daily  . pregabalin  25 mg Oral QHS  . psyllium  1 packet Oral QHS  . sodium chloride flush  10-40 mL Intracatheter Q12H    Continuous Infusions: . meropenem (MERREM) IV 1 g (09/24/19 1101)     LOS: 6 days     Kayleen Memos, MD [facility] Pager [phone]  If 7PM-7AM, please contact night-coverage [email or web address] Password [facility] 09/24/2019, 12:43 PM

## 2019-09-25 LAB — CBC
HCT: 27.7 % — ABNORMAL LOW (ref 36.0–46.0)
Hemoglobin: 8.9 g/dL — ABNORMAL LOW (ref 12.0–15.0)
MCH: 31.6 pg (ref 26.0–34.0)
MCHC: 32.1 g/dL (ref 30.0–36.0)
MCV: 98.2 fL (ref 80.0–100.0)
Platelets: 188 10*3/uL (ref 150–400)
RBC: 2.82 MIL/uL — ABNORMAL LOW (ref 3.87–5.11)
RDW: 12.9 % (ref 11.5–15.5)
WBC: 8.4 10*3/uL (ref 4.0–10.5)
nRBC: 0 % (ref 0.0–0.2)

## 2019-09-25 LAB — CULTURE, BLOOD (ROUTINE X 2)
Culture: NO GROWTH
Culture: NO GROWTH
Special Requests: ADEQUATE
Special Requests: ADEQUATE

## 2019-09-25 LAB — FOLATE RBC
Folate, Hemolysate: 468 ng/mL
Folate, RBC: 1660 ng/mL (ref >498)
Hematocrit: 28.2 % — ABNORMAL LOW (ref 34.0–46.6)

## 2019-09-25 LAB — MAGNESIUM: Magnesium: 1.8 mg/dL (ref 1.7–2.4)

## 2019-09-25 LAB — POTASSIUM: Potassium: 3.7 mmol/L (ref 3.5–5.1)

## 2019-09-25 MED ORDER — PANTOPRAZOLE SODIUM 40 MG PO TBEC: 40.0000 mg | DELAYED_RELEASE_TABLET | Freq: Every day | ORAL | 1 refills | Status: AC

## 2019-09-25 MED ORDER — DILTIAZEM HCL ER COATED BEADS 180 MG PO CP24: 180.0000 mg | ORAL_CAPSULE | Freq: Every day | ORAL | 1 refills | Status: AC

## 2019-09-25 MED ORDER — ACETAMINOPHEN 325 MG PO TABS: 650.0000 mg | ORAL_TABLET | Freq: Four times a day (QID) | ORAL | 0 refills | Status: AC | PRN

## 2019-09-25 MED ORDER — METOPROLOL TARTRATE 25 MG PO TABS: 12.5000 mg | ORAL_TABLET | Freq: Two times a day (BID) | ORAL | 1 refills | Status: AC

## 2019-09-25 MED ORDER — ATORVASTATIN CALCIUM 20 MG PO TABS: 20.0000 mg | ORAL_TABLET | Freq: Every day | ORAL | 0 refills | Status: AC

## 2019-09-25 MED ORDER — APIXABAN 5 MG PO TABS: 5.0000 mg | ORAL_TABLET | Freq: Two times a day (BID) | ORAL | 1 refills | Status: AC

## 2019-09-25 NOTE — Progress Notes (Signed)
Progress Note  Patient Name: Victoria Walsh Date of Encounter: 09/25/2019  Primary Cardiologist:   Sanda Klein, MD   Subjective   Very confused.  No pain. No SOB  Inpatient Medications    Scheduled Meds: . apixaban  5 mg Oral BID  . atorvastatin  20 mg Oral q1800  . brimonidine  1 drop Left Eye Q12H   And  . timolol  1 drop Left Eye Q12H  . brinzolamide  1 drop Left Eye BID  . Chlorhexidine Gluconate Cloth  6 each Topical Daily  . diltiazem  180 mg Oral Daily  . docusate sodium  100 mg Oral Daily  . DULoxetine  20 mg Oral QHS  . latanoprost  1 drop Left Eye QHS  . letrozole  2.5 mg Oral Daily  . levothyroxine  100 mcg Oral QAC breakfast  . metoprolol tartrate  12.5 mg Oral BID  . pantoprazole  40 mg Oral Daily  . potassium chloride  40 mEq Oral Daily  . pregabalin  25 mg Oral QHS  . psyllium  1 packet Oral QHS  . sodium chloride flush  10-40 mL Intracatheter Q12H   Continuous Infusions: . meropenem (MERREM) IV 1 g (09/25/19 0058)   PRN Meds: acetaminophen, acetaminophen, ipratropium-albuterol, metoprolol tartrate, ondansetron, sodium chloride flush   Vital Signs    Vitals:   09/24/19 1219 09/24/19 2216 09/25/19 0522 09/25/19 0943  BP: 140/64 (!) 128/55 122/72 (!) 124/56  Pulse: 73 62 60 63  Resp: 18 18 17    Temp: 98.1 F (36.7 C) 98.1 F (36.7 C) 98.4 F (36.9 C)   TempSrc: Oral Oral Oral   SpO2: 90% 97% 98%   Weight:      Height:        Intake/Output Summary (Last 24 hours) at 09/25/2019 1142 Last data filed at 09/25/2019 0942 Gross per 24 hour  Intake 490 ml  Output 1200 ml  Net -710 ml   Filed Weights   09/18/19 1457 09/21/19 0426  Weight: 79.9 kg 82.7 kg    Telemetry    NSR - Personally Reviewed  ECG    NA - Personally Reviewed  Physical Exam   GEN: No  acute distress.   Neck: No  JVD Cardiac: RRR, no murmurs, rubs, or gallops.  Respiratory: Clear   to auscultation bilaterally. GI: Soft, nontender, non-distended, normal bowel  sounds  MS:  No edema; No deformity. Neuro:   Left hemiparesis Psych:    Very confused but pleasant.     Labs    Chemistry Recent Labs  Lab 09/22/19 1052 09/22/19 1052 09/23/19 0543 09/24/19 0355 09/25/19 0630  NA 141  --  142 138  --   K 3.3*   < > 3.5 3.1* 3.7  CL 111  --  112* 109  --   CO2 21*  --  20* 22  --   GLUCOSE 119*  --  100* 101*  --   BUN 27*  --  25* 23  --   CREATININE 0.91  --  0.81 0.93  --   CALCIUM 8.1*  --  8.1* 7.7*  --   GFRNONAA 58*  --  >60 56*  --   GFRAA >60  --  >60 >60  --   ANIONGAP 9  --  10 7  --    < > = values in this interval not displayed.     Hematology Recent Labs  Lab 09/23/19 0543 09/24/19 0355 09/25/19 0358  WBC 8.3  8.3 8.4  RBC 2.92* 2.80* 2.82*  HGB 9.0* 8.8* 8.9*  HCT 29.2* 27.7* 27.7*  MCV 100.0 98.9 98.2  MCH 30.8 31.4 31.6  MCHC 30.8 31.8 32.1  RDW 13.3 13.1 12.9  PLT 151 164 188    Cardiac EnzymesNo results for input(s): TROPONINI in the last 168 hours. No results for input(s): TROPIPOC in the last 168 hours.   BNPNo results for input(s): BNP, PROBNP in the last 168 hours.   DDimer No results for input(s): DDIMER in the last 168 hours.   Radiology    No results found.  Cardiac Studies   2D echo 09/2019  IMPRESSIONS    1. Left ventricular ejection fraction, by visual estimation, is 55 to  60%. The left ventricle has normal function. There is no left ventricular  hypertrophy.  2. Left ventricular diastolic parameters are indeterminate due to atrial  fibrillation.  3. The left ventricle has no regional wall motion abnormalities.  4. Global right ventricle has normal systolic function.The right  ventricular size is normal. No increase in right ventricular wall  thickness.  5. Left atrial size was normal.  6. Right atrial size was normal.  7. Mild mitral annular calcification.  8. The mitral valve is normal in structure. Mild mitral valve  regurgitation. No evidence of mitral stenosis.    9. The tricuspid valve is normal in structure. Tricuspid valve  regurgitation is not demonstrated.  10. The aortic valve is normal in structure. Aortic valve regurgitation is  not visualized. Mild to moderate aortic valve stenosis.  11. The pulmonic valve was normal in structure. Pulmonic valve  regurgitation is not visualized.  12. Normal pulmonary artery systolic pressure.  13. The inferior vena cava is normal in size with greater than 50%  respiratory variability, suggesting right atrial pressure of 3 mmHg.    Patient Profile     86 y.o. female  with a hx of ischemic CVA with residual Lt eye blindness, Lt sided hemiparesis, wheelchair bound, advanced dementia, breast cancer, hypothyroid lives at [facility] and sent to ER for foley not draining (neurogenic bladder from CVA with chronic foley) hx of UTI  who is being seen for the evaluation of atrial fib with RVR at the request of Dr. [place] Crane.   Assessment & Plan    PAF:  Victoria Walsh has a CHA2DS2 - VASc score of 6.  She is on Eliquis.     Now back in NSR.   Changed to Cardizem CD.  OK to stop ASA on discharge.   We are arranging TOC follow up which I have talked to the nurse about to make sure that this gets on her AVS.  She needs follow up CBC soon after discharge given her low (but stable )Hgb.  She is going to a nursing home.    HTN:  BP OK.        For questions or updates, please contact [place] Please consult [email or web address] for contact info under Cardiology/STEMI.   Signed, Minus Breeding, MD  09/25/2019, 11:42 AM

## 2019-09-25 NOTE — Progress Notes (Signed)
Call placed to [facility] . Message left on VM

## 2019-09-25 NOTE — Progress Notes (Signed)
Pharmacy Antibiotic Note  Victoria Walsh is a 86 y.o. female with chronic foley cath and hx ESBL Ecoli UTI presented to the ED on 09/18/2019 with fever and clogged foley cath.  She was started on meropenem on admission for suspected UTI.  BCx and UCx are both positive for ESBL Ecoli.  Today, 09/25/2019: - day #7 full dose antibiotics - afeb, WBC WNL - Renal function stable. SCr 0.9, CrCl ~50 mL/min  Plan: - Continue meropenem 1g IV q8h _______________________________________  Height: 5\' 8"  (172.7 cm) Weight: 182 lb 5.1 oz (82.7 kg) IBW/kg (Calculated) : 63.9  Temp (24hrs), Avg:98.2 F (36.8 C), Min:98.1 F (36.7 C), Max:98.4 F (36.9 C)  Recent Labs  Lab 09/18/19 1137 09/19/19 0514 09/21/19 0401 09/22/19 0522 09/22/19 1052 09/23/19 0543 09/24/19 0355 09/25/19 0358  WBC 18.7*   < > 6.7 8.9  --  8.3 8.3 8.4  CREATININE 1.35*   < > 1.00 0.89 0.91 0.81 0.93  --   LATICACIDVEN 1.4  --   --   --   --   --   --   --    < > = values in this interval not displayed.    Estimated Creatinine Clearance: 49.8 mL/min (by C-G formula based on SCr of 0.93 mg/dL).    Allergies  Allergen Reactions  . Ciprofloxacin Other (See Comments)    On MAR  . Oxaprozin Other (See Comments)    DAYPRO (SULFA DRUG) On MAR  . Sulfa Antibiotics Other (See Comments)    On MAR    Antimicrobials this admission:  2/2 Meropenem >>    Microbiology results:  Previous: 09/21/18 ucx: >100K ESBL Ecoli  04/29/19 ucx: >100k proteus (R= nitrof); 80K ESBL ecoli (S=gent, imi, nitro, zosyn)  Current: 2/2 BCx: 1/3 bottles with GNR (BCID=Ecoli) ESBL Ecoli (S= gent, imi, zosyn) FINAL 2/2 UCx: ESBL Ecoli (S= gent, imi, nitro, zosyn) FINAL 2/2 Resp Panel:  neg for covid and influenza 2/4 repeat BCx x2: NGF  Lenis Noon, PharmD 09/25/2019 8:45 AM

## 2019-09-25 NOTE — TOC Transition Note (Signed)
Transition of Care [facility]) - CM/SW Discharge Note   Patient Details  Name: Victoria Walsh MRN: [id number] Date of Birth: Nov 08, 1931  [facility] CM/SW Contact:  Dessa Phi, RN Phone Number: 09/25/2019, 11:32 AM   Clinical Narrative:   D/c today back to [facility]-rep Bee received d/c summary,going to rm#[id number],nurse to call report tel#[phone]. PTAR called. No further CM needs.    Final next level of care: Assisted Living Barriers to Discharge: No Barriers Identified   Patient Goals and CMS Choice Patient states their goals for this hospitalization and ongoing recovery are:: return back to ALF CMS Medicare.gov Compare Post Acute Care list provided to:: Patient Represenative (must comment)(Jim-son [phone]) Choice offered to / list presented to : Adult Children  Discharge Placement              Patient chooses bed at: [facility] Patient to be transferred to facility by: Dora Name of family member notified: Sherilyn Hauswirth son [id number] Patient and family notified of of transfer: 09/25/19  Discharge Plan and Services   Discharge Planning Services: CM Consult                                 Social Determinants of Health ([place]) Interventions     Readmission Risk Interventions Readmission Risk Prevention Plan 09/19/2019  Transportation Screening Complete  PCP or Specialist Appt within 3-5 Days Complete  HRI or [place] Complete  Social Work Consult for [place] Planning/Counseling Complete  Palliative Care Screening Complete  Medication Review Press photographer) Complete  Some recent data might be hidden

## 2019-09-25 NOTE — Progress Notes (Signed)
Report given to nurse at [facility] , Call placed to family to notify of D/C awaiting transport

## 2019-09-25 NOTE — Plan of Care (Signed)
  Problem: Education: Goal: Knowledge of General Education information will improve Description: Including pain rating scale, medication(s)/side effects and non-pharmacologic comfort measures Outcome: Adequate for Discharge   Problem: Activity: Goal: Risk for activity intolerance will decrease Outcome: Adequate for Discharge   Problem: Nutrition: Goal: Adequate nutrition will be maintained Outcome: Adequate for Discharge   Problem: Elimination: Goal: Will not experience complications related to bowel motility Outcome: Adequate for Discharge Goal: Will not experience complications related to urinary retention Outcome: Adequate for Discharge   Problem: Pain Managment: Goal: General experience of comfort will improve Outcome: Adequate for Discharge   Problem: Safety: Goal: Ability to remain free from injury will improve Outcome: Adequate for Discharge   Problem: Skin Integrity: Goal: Risk for impaired skin integrity will decrease Outcome: Adequate for Discharge   

## 2019-09-25 NOTE — Care Management Important Message (Signed)
Important Message  Patient Details IM Letter given to Dessa Phi RN Case Manager to present to the Patient Name: Victoria Walsh MRN: [id number] Date of Birth: 02/21/1932   Medicare Important Message Given:  Yes     Kerin Salen 09/25/2019, 12:24 PM

## 2019-09-25 NOTE — Discharge Summary (Signed)
Discharge Summary  Victoria Walsh [id number] DOB: 30-Dec-1931  PCP: Reymundo Poll, MD  Admit date: 09/18/2019 Discharge date: 09/25/2019  Time spent: 35 minutes.  Recommendations for Outpatient Follow-up:  1. Follow up with cardiology in 1 to 2 weeks 2. Follow up with your PCP in 1 to 2 weeks 3. Take your medications as prescribed 4. Fall precautions  Discharge Diagnoses:  Active Hospital Problems   Diagnosis Date Noted  . Sepsis ([place]) 09/18/2019  . PAF (paroxysmal atrial fibrillation) ([place])   . Atrial fibrillation with RVR ([place])   . Chronic diastolic CHF (congestive heart failure) ([place])   . Coronary artery disease involving native coronary artery of native heart without angina pectoris   . Essential hypertension   . Acute lower UTI 09/18/2019  . AKI (acute kidney injury) ([place]) 09/18/2019  . Dementia with behavioral disturbance ([place]) 09/18/2019  . Ischemic stroke ([place]) 09/18/2019  . Hypothyroidism 09/18/2019  . Breast cancer ([place]) 09/18/2019    Resolved Hospital Problems  No resolved problems to display.    Discharge Condition: Stable  Diet recommendation: Heart healthy diet.  Vitals:   09/25/19 0522 09/25/19 0943  BP: 122/72 (!) 124/56  Pulse: 60 63  Resp: 17   Temp: 98.4 F (36.9 C)   SpO2: 98%     History of present illness:  Victoria Walsh a 86 y.o.femalewith medical history significant ofischemic stroke with residual left eye blindness, left sided hemiparesis, wheelchair-bound, advanced dementia, breast cancer, hypothyroidism who was sent from [facility] skilled nursing facility with complaints of fever and altered mental status. Due to patient mental status, all information was taken from the son on phone. Patient has neurogenic bladder from stroke so she has a chronic indwelling Foley catheter. Facility reported that the catheter stopped draining. When she presented here she had fever of 102 F, tachycardic. Foley catheter was very dirty and it was  not draining urine. Her blood pressure was stable on presentation. She has history of ESBL E. coli UTI.  Presentation to the ED positive UA and urine culture greater than 100,000 colonies of ESBL E. coli.  ESBL E. coli bacteremia.  She is on meropenem.  Hospital course complicated by A. fib with RVR on 09/21/2019.  No prior history of A. fib.  Cardiology consulted.  Was placed on diltiazem drip with improvement of heart rate then switched to po diltiazem.  2D echo completed on 09/21/19 showed normal LVEF.  09/24/19: Seen and examined at her bedside this morning.  No acute events overnight.  She has no new complaints.  Vital signs and labs reviewed and are stable.  Patient will be discharged to memory care.   Hospital Course:  Principal Problem:   Sepsis ([place]) Active Problems:   Acute lower UTI   AKI (acute kidney injury) ([place])   Dementia with behavioral disturbance (HCC)   Ischemic stroke ([place])   Hypothyroidism   Breast cancer ([place])   Atrial fibrillation with RVR (H[facility])   Chronic diastolic CHF (congestive heart failure) (HCC)   Coronary artery disease involving native coronary artery of native heart without angina pectoris   Essential hypertension   PAF (paroxysmal atrial fibrillation) (H[facility])  Resolved sepsis secondary to recurrent ESBL E. Coli bacteremia likely urinary source from ESBL E. coli UTI ESBL E. coli UTI, ESBL E.  Coli bacteremia. Repeated blood cultures x2 done on 09/20/19 negative final. Sepsis criteria has resolved.  No leukocytosis.  Afebrile. Sepsis criteria is improving, no leukocytosis and afebrile. Completed 7 days of IV meropenem. Follow-up  with your primary care provider  Newly diagnosed A. fib with RVR Now back in normal sinus rhythm. TSH normal. CHADSVASC score 6 On Eliquis for secondary CVA prevention. Continue Eliquis 5 mg twice daily. 2D echo showed normal LVEF. Continue Lopressor 12.5 mg twice daily, p.o. Cardizem 180 mg daily. Rate is controlled on  this regimen.   Follow-up with your cardiologist outpatient. Vital signs are stable: Blood pressure 124/56, heart rate 64, respiration rate 21, O2 saturation 98% on room air.  Resolved AKI likely prerenal in the setting of dehydration from poor oral intake Baseline creatinine appears to be 0.8 with GFR greater than 60 Presented with creatinine of 1.3 with GFR of 37. Creatinine 0.9 with GFR of 56. Continue to avoid nephrotoxins Follow-up with your PCP  Resolved post repletion: Hypokalemia Potassium 3.1>> 3.7. Repleted. Follow-up with your PCP.  Chronic normocytic anemia  No sign of overt bleeding Negative FOBT H&H 8.9/27.7 with MCV of 98, stable and mildly trending up from hemoglobin 8.8 on 09/24/2019.  Hx of ischemic CVA Aspirin discontinued per cardiologist request, discussed with Dr. Percival Spanish on 09/25/2019. LDL 68 Continue Lipitor 20 mg nightly Continue Eliquis for secondary CVA prevention.  Dementia Reorient as needed with fall precautions. Will be discharged to memory care.  Hypothyroidism Continue Synthroid Tsh normal  GERD Continue PPI  History of left breast cancer  Continue letrozole    Code Status:Full code  Consults called: Cardiology.   Discharge Exam: BP (!) 124/56   Pulse 63   Temp 98.4 F (36.9 C) (Oral)   Resp 17   Ht 5\' 8"  (1.727 m)   Wt 82.7 kg   SpO2 98%   BMI 27.72 kg/m  . General: 86 y.o. year-old female pleasant, well developed well nourished in no acute distress.  Alert and interactive in the setting of dementia. . Cardiovascular: Regular rate and rhythm with no rubs or gallops.  No thyromegaly or JVD noted.   Marland Kitchen Respiratory: Clear to auscultation with no wheezes or rales. Good inspiratory effort. . Abdomen: Soft nontender nondistended with normal bowel sounds x4 quadrants. . Musculoskeletal: No lower extremity edema. 2/4 pulses in all 4 extremities. Marland Kitchen Psychiatry: Mood is appropriate for condition and setting  Discharge  Instructions You were cared for by a hospitalist during your hospital stay. If you have any questions about your discharge medications or the care you received while you were in the hospital after you are discharged, you can call the unit and asked to speak with the hospitalist on call if the hospitalist that took care of you is not available. Once you are discharged, your primary care physician will handle any further medical issues. Please note that NO REFILLS for any discharge medications will be authorized once you are discharged, as it is imperative that you return to your primary care physician (or establish a relationship with a primary care physician if you do not have one) for your aftercare needs so that they can reassess your need for medications and monitor your lab values.   Allergies as of 09/25/2019      Reactions   Ciprofloxacin Other (See Comments)   On MAR   Oxaprozin Other (See Comments)   DAYPRO (SULFA DRUG) On MAR   Sulfa Antibiotics Other (See Comments)   On MAR      Medication List    STOP taking these medications   aspirin EC 81 MG tablet   ICaps Areds 2 Caps   omeprazole 20 MG capsule Commonly known as: PRILOSEC  traMADol 50 MG tablet Commonly known as: ULTRAM     TAKE these medications   acetaminophen 325 MG tablet Commonly known as: TYLENOL Take 2 tablets (650 mg total) by mouth every 6 (six) hours as needed for up to 3 days for mild pain, fever or headache. What changed:   when to take this  reasons to take this   apixaban 5 MG Tabs tablet Commonly known as: ELIQUIS Take 1 tablet (5 mg total) by mouth 2 (two) times daily.   atorvastatin 20 MG tablet Commonly known as: LIPITOR Take 1 tablet (20 mg total) by mouth daily at 6 PM.   brinzolamide 1 % ophthalmic suspension Commonly known as: AZOPT Place 1 drop into the left eye 2 (two) times daily.   Calmoseptine 0.44-20.6 % Oint Generic drug: Menthol-Zinc Oxide Apply 1 application topically 2  (two) times daily. Apply to buttocks for rednedd   Combigan 0.2-0.5 % ophthalmic solution Generic drug: brimonidine-timolol Place 1 drop into the left eye every 12 (twelve) hours.   Cranberry 450 MG Caps Take 450 mg by mouth 2 (two) times daily.   diltiazem 180 MG 24 hr capsule Commonly known as: CARDIZEM CD Take 1 capsule (180 mg total) by mouth daily. Start taking on: September 26, 2019   docusate sodium 100 MG capsule Commonly known as: COLACE Take 100 mg by mouth daily.   DULoxetine 20 MG capsule Commonly known as: CYMBALTA Take 20 mg by mouth at bedtime.   latanoprost 0.005 % ophthalmic solution Commonly known as: XALATAN Place 1 drop into the left eye at bedtime.   letrozole 2.5 MG tablet Commonly known as: FEMARA Take 2.5 mg by mouth daily.   levothyroxine 100 MCG tablet Commonly known as: SYNTHROID Take 100 mcg by mouth daily before breakfast.   Melatonin 3 MG Tabs Take 3 mg by mouth at bedtime.   metoprolol tartrate 25 MG tablet Commonly known as: LOPRESSOR Take 0.5 tablets (12.5 mg total) by mouth 2 (two) times daily.   multivitamins with iron Tabs tablet Take 1 tablet by mouth at bedtime.   pantoprazole 40 MG tablet Commonly known as: PROTONIX Take 1 tablet (40 mg total) by mouth daily. Start taking on: September 26, 2019   pregabalin 25 MG capsule Commonly known as: LYRICA Take 25 mg by mouth at bedtime.   Psyllium 500 MG Caps Take 500 mg by mouth at bedtime.   Sarna lotion Generic drug: camphor-menthol Apply 1 application topically every 8 (eight) hours as needed for itching. Apply to left ear topically for itching unsupervised self administration may keep in room and self applyprn   senna 8.6 MG Tabs tablet Commonly known as: SENOKOT Take 1 tablet by mouth See admin instructions. Monday, Wednesday and Friday in the evening      Allergies  Allergen Reactions  . Ciprofloxacin Other (See Comments)    On MAR  . Oxaprozin Other (See  Comments)    DAYPRO (SULFA DRUG) On MAR  . Sulfa Antibiotics Other (See Comments)    On MAR   Follow-up Information    Reymundo Poll, MD. Call in 1 day(s).   Specialty: Family Medicine Why: Please call for a post hospital follow-up appointment. Contact information: [address] [place] [address] [phone]        Sanda Klein, MD .   Specialty: Cardiology Contact information: [address] [place] [address] [phone]            The results of  significant diagnostics from this hospitalization (including imaging, microbiology, ancillary and laboratory) are listed below for reference.    Significant Diagnostic Studies: DG CHEST PORT 1 VIEW  Result Date: 09/21/2019 CLINICAL DATA:  Wheezing, dementia EXAM: PORTABLE CHEST 1 VIEW COMPARISON:  09/18/2019 FINDINGS: Single frontal view of the chest demonstrates a stable cardiac silhouette. Increasing interstitial prominence with Kerley B-lines consistent with mild interstitial edema. No airspace disease, effusion, or pneumothorax. IMPRESSION: 1. Mild interstitial edema. Electronically Signed   By: Randa Ngo M.D.   On: 09/21/2019 08:53   DG Chest Port 1 View  Result Date: 09/18/2019 CLINICAL DATA:  Fever.  Altered mental status. EXAM: PORTABLE CHEST 1 VIEW COMPARISON:  Chest x-ray 04/26/2019. FINDINGS: Mediastinum heart size appears stable. Low lung volumes. Mild bibasilar atelectasis. Mild left base infiltrate cannot be excluded. No pleural effusion or pneumothorax. Surgical clips right chest. IMPRESSION: Low lung volumes with mild bibasilar atelectasis. Mild left base infiltrate cannot be excluded. Electronically Signed   By: Marcello Moores  Register   On: 09/18/2019 12:11   ECHOCARDIOGRAM COMPLETE  Result Date: 09/21/2019   ECHOCARDIOGRAM REPORT   Patient Name:   MUNACHISO LEONHART Date of Exam: 09/21/2019 Medical Rec #:  [id number]   Height:       68.0 in Accession #:    [id number]  Weight:        182.3 lb Date of Birth:  1931-09-27   BSA:          1.96 m Patient Age:    6 years    BP:           108/52 mmHg Patient Gender: F           HR:           105 bpm. Exam Location:  Inpatient Procedure: 2D Echo, Cardiac Doppler and Color Doppler Indications:    I48.0 Paroxysmal atrial fibrillation  History:        Patient has no prior history of Echocardiogram examinations.                 Stroke; Risk Factors:Dyslipidemia. CKD.  Sonographer:    Jonelle Sidle Dance Referring Phys: [id number] [place]  1. Left ventricular ejection fraction, by visual estimation, is 55 to 60%. The left ventricle has normal function. There is no left ventricular hypertrophy.  2. Left ventricular diastolic parameters are indeterminate due to atrial fibrillation.  3. The left ventricle has no regional wall motion abnormalities.  4. Global right ventricle has normal systolic function.The right ventricular size is normal. No increase in right ventricular wall thickness.  5. Left atrial size was normal.  6. Right atrial size was normal.  7. Mild mitral annular calcification.  8. The mitral valve is normal in structure. Mild mitral valve regurgitation. No evidence of mitral stenosis.  9. The tricuspid valve is normal in structure. Tricuspid valve regurgitation is not demonstrated. 10. The aortic valve is normal in structure. Aortic valve regurgitation is not visualized. Mild to moderate aortic valve stenosis. 11. The pulmonic valve was normal in structure. Pulmonic valve regurgitation is not visualized. 12. Normal pulmonary artery systolic pressure. 13. The inferior vena cava is normal in size with greater than 50% respiratory variability, suggesting right atrial pressure of 3 mmHg. FINDINGS  Left Ventricle: Left ventricular ejection fraction, by visual estimation, is 55 to 60%. The left ventricle has normal function. The left ventricle has no regional wall motion abnormalities. There is no left ventricular hypertrophy. Left  ventricular diastolic parameters are indeterminate.  Normal left atrial pressure. Right Ventricle: The right ventricular size is normal. No increase in right ventricular wall thickness. Global RV systolic function is has normal systolic function. The tricuspid regurgitant velocity is 2.10 m/s, and with an assumed right atrial pressure  of 3 mmHg, the estimated right ventricular systolic pressure is normal at 20.6 mmHg. Left Atrium: Left atrial size was normal in size. Right Atrium: Right atrial size was normal in size Pericardium: There is no evidence of pericardial effusion. Mitral Valve: The mitral valve is normal in structure. Mild mitral annular calcification. Mild mitral valve regurgitation. No evidence of mitral valve stenosis by observation. Tricuspid Valve: The tricuspid valve is normal in structure. Tricuspid valve regurgitation is not demonstrated. Aortic Valve: The aortic valve is normal in structure.. There is moderate thickening and mild calcification of the aortic valve. Aortic valve regurgitation is not visualized. Aortic regurgitation PHT measures 514 msec. Mild to moderate aortic stenosis is  present. There is moderate thickening of the aortic valve. There is mild calcification of the aortic valve. Pulmonic Valve: The pulmonic valve was normal in structure. Pulmonic valve regurgitation is not visualized. Pulmonic regurgitation is not visualized. Aorta: The aortic root, ascending aorta and aortic arch are all structurally normal, with no evidence of dilitation or obstruction. Venous: The inferior vena cava is normal in size with greater than 50% respiratory variability, suggesting right atrial pressure of 3 mmHg. IAS/Shunts: No atrial level shunt detected by color flow Doppler. There is no evidence of a patent foramen ovale. No ventricular septal defect is seen or detected. There is no evidence of an atrial septal defect.  LEFT VENTRICLE PLAX 2D LVIDd:         3.80 cm LVIDs:         3.10 cm LV PW:          1.20 cm LV IVS:        1.00 cm LVOT diam:     1.80 cm LV SV:         24 ml LV SV Index:   11.96 LVOT Area:     2.54 cm  RIGHT VENTRICLE             IVC RV Basal diam:  2.80 cm     IVC diam: 1.70 cm RV S prime:     10.30 cm/s TAPSE (M-mode): 1.9 cm LEFT ATRIUM             Index       RIGHT ATRIUM           Index LA diam:        3.40 cm 1.73 cm/m  RA Area:     12.00 cm LA Vol (A2C):   41.6 ml 21.17 ml/m RA Volume:   23.80 ml  12.11 ml/m LA Vol (A4C):   48.0 ml 24.43 ml/m LA Biplane Vol: 46.7 ml 23.77 ml/m  AORTIC VALVE LVOT Vmax:   86.60 cm/s LVOT Vmean:  55.700 cm/s LVOT VTI:    0.159 m AI PHT:      514 msec  AORTA Ao Root diam: 3.70 cm Ao Asc diam:  3.60 cm MITRAL VALVE                        TRICUSPID VALVE MV Area (PHT): 3.75 cm             TR Peak grad:   17.6 mmHg MV PHT:        58.72 msec  TR Vmax:        210.00 cm/s MV Decel Time: 203 msec MV E velocity: 109.00 cm/s 103 cm/s SHUNTS                                     Systemic VTI:  0.16 m                                     Systemic Diam: 1.80 cm  Dani Gobble Croitoru MD Electronically signed by Sanda Klein MD Signature Date/Time: 09/21/2019/4:43:21 PM    Final     Microbiology: Recent Results (from the past 240 hour(s))  Blood Culture (routine x 2)     Status: Abnormal   Collection Time: 09/18/19 11:38 AM   Specimen: BLOOD  Result Value Ref Range Status   Specimen Description   Final    BLOOD LEFT ARM Performed at [facility] [address] [place], [place] [address]    Special Requests   Final    BOTTLES DRAWN AEROBIC AND ANAEROBIC Blood Culture adequate volume Performed at [facility] [address], [place], [place] [address]    Culture  Setup Time   Final    GRAM NEGATIVE RODS AEROBIC BOTTLE ONLY CRITICAL RESULT CALLED TO, READ BACK BY AND VERIFIED WITH: [place]. [id number] FCP Performed at [facility], [place] [address], [place], [place] [address]    Culture (A)   Final    ESCHERICHIA COLI Confirmed Extended Spectrum Beta-Lactamase Producer (ESBL).  In bloodstream infections from ESBL organisms, carbapenems are preferred over piperacillin/tazobactam. They are shown to have a lower risk of mortality.    Report Status 09/21/2019 FINAL  Final   Organism ID, Bacteria ESCHERICHIA COLI  Final      Susceptibility   Escherichia coli - MIC*    AMPICILLIN >=32 RESISTANT Resistant     CEFAZOLIN >=64 RESISTANT Resistant     CEFEPIME >=32 RESISTANT Resistant     CEFTAZIDIME RESISTANT Resistant     CEFTRIAXONE >=64 RESISTANT Resistant     CIPROFLOXACIN >=4 RESISTANT Resistant     GENTAMICIN <=1 SENSITIVE Sensitive     IMIPENEM <=0.25 SENSITIVE Sensitive     TRIMETH/SULFA >=320 RESISTANT Resistant     AMPICILLIN/SULBACTAM >=32 RESISTANT Resistant     PIP/TAZO <=4 SENSITIVE Sensitive     * ESCHERICHIA COLI  Blood Culture (routine x 2)     Status: None   Collection Time: 09/18/19 11:38 AM   Specimen: BLOOD  Result Value Ref Range Status   Specimen Description   Final    BLOOD RIGHT ARM Performed at [facility] [address], [place], [place] [address]    Special Requests   Final    BOTTLES DRAWN AEROBIC ONLY Blood Culture results may not be optimal due to an inadequate volume of blood received in culture bottles Performed at [facility] [address], [place], [place] [address]    Culture   Final    NO GROWTH 5 DAYS Performed at [facility], [place] [address] [place],  [address]    Report Status 09/23/2019 FINAL  Final  Urine culture     Status: Abnormal   Collection Time: 09/18/19 11:38 AM   Specimen: In/Out Cath Urine  Result Value Ref Range Status  Specimen Description   Final    IN/OUT CATH URINE Performed at [facility] [address], [place], [place] [address]    Special Requests   Final    NONE Performed at [facility], [place] [address]  [place], [place] [address]    Culture (A)  Final    >=100,000 COLONIES/mL ESCHERICHIA COLI Confirmed Extended Spectrum Beta-Lactamase Producer (ESBL).  In bloodstream infections from ESBL organisms, carbapenems are preferred over piperacillin/tazobactam. They are shown to have a lower risk of mortality.    Report Status 09/20/2019 FINAL  Final   Organism ID, Bacteria ESCHERICHIA COLI (A)  Final      Susceptibility   Escherichia coli - MIC*    AMPICILLIN >=32 RESISTANT Resistant     CEFAZOLIN >=64 RESISTANT Resistant     CEFTRIAXONE >=64 RESISTANT Resistant     CIPROFLOXACIN >=4 RESISTANT Resistant     GENTAMICIN <=1 SENSITIVE Sensitive     IMIPENEM <=0.25 SENSITIVE Sensitive     NITROFURANTOIN <=16 SENSITIVE Sensitive     TRIMETH/SULFA >=320 RESISTANT Resistant     AMPICILLIN/SULBACTAM >=32 RESISTANT Resistant     PIP/TAZO <=4 SENSITIVE Sensitive     * >=100,000 COLONIES/mL ESCHERICHIA COLI  Blood Culture ID Panel (Reflexed)     Status: Abnormal   Collection Time: 09/18/19 11:38 AM  Result Value Ref Range Status   Enterococcus species NOT DETECTED NOT DETECTED Final   Listeria monocytogenes NOT DETECTED NOT DETECTED Final   Staphylococcus species NOT DETECTED NOT DETECTED Final   Staphylococcus aureus (BCID) NOT DETECTED NOT DETECTED Final   Streptococcus species NOT DETECTED NOT DETECTED Final   Streptococcus agalactiae NOT DETECTED NOT DETECTED Final   Streptococcus pneumoniae NOT DETECTED NOT DETECTED Final   Streptococcus pyogenes NOT DETECTED NOT DETECTED Final   Acinetobacter baumannii NOT DETECTED NOT DETECTED Final   Enterobacteriaceae species DETECTED (A) NOT DETECTED Final    Comment: Enterobacteriaceae represent a large family of gram-negative bacteria, not a single organism. CRITICAL RESULT CALLED TO, READ BACK BY AND VERIFIED WITH: PHARMD DREW WOFFORD PO:[phone] FCP    Enterobacter cloacae complex NOT DETECTED NOT DETECTED Final   Escherichia coli DETECTED (A) NOT  DETECTED Final    Comment: CRITICAL RESULT CALLED TO, READ BACK BY AND VERIFIED WITH: PHARMD DREW WOFFORD PO:[id number] FCP    Klebsiella oxytoca NOT DETECTED NOT DETECTED Final   Klebsiella pneumoniae NOT DETECTED NOT DETECTED Final   Proteus species NOT DETECTED NOT DETECTED Final   Serratia marcescens NOT DETECTED NOT DETECTED Final   Carbapenem resistance NOT DETECTED NOT DETECTED Final   Haemophilus influenzae NOT DETECTED NOT DETECTED Final   Neisseria meningitidis NOT DETECTED NOT DETECTED Final   Pseudomonas aeruginosa NOT DETECTED NOT DETECTED Final   Candida albicans NOT DETECTED NOT DETECTED Final   Candida glabrata NOT DETECTED NOT DETECTED Final   Candida krusei NOT DETECTED NOT DETECTED Final   Candida parapsilosis NOT DETECTED NOT DETECTED Final   Candida tropicalis NOT DETECTED NOT DETECTED Final    Comment: Performed at [facility], [place] [address], [place], [place] [address]  Respiratory Panel by RT PCR (Flu A&B, Covid) - Nasopharyngeal Swab     Status: None   Collection Time: 09/18/19  1:20 PM   Specimen: Nasopharyngeal Swab  Result Value Ref Range Status   SARS Coronavirus 2 by RT PCR NEGATIVE NEGATIVE Final    Comment: (NOTE) SARS-CoV-2 target nucleic acids are NOT DETECTED. The SARS-CoV-2 RNA is generally  detectable in upper respiratoy specimens during the acute phase of infection. The lowest concentration of SARS-CoV-2 viral copies this assay can detect is 131 copies/mL. A negative result does not preclude SARS-Cov-2 infection and should not be used as the sole basis for treatment or other patient management decisions. A negative result may occur with  improper specimen collection/handling, submission of specimen other than nasopharyngeal swab, presence of viral mutation(s) within the areas targeted by this assay, and inadequate number of viral copies (<131 copies/mL). A negative result must be combined with clinical observations, patient history, and  epidemiological information. The expected result is Negative. Fact Sheet for Patients:  PinkCheek.be Fact Sheet for Healthcare Providers:  GravelBags.it This test is not yet ap proved or cleared by the [place] FDA and  has been authorized for detection and/or diagnosis of SARS-CoV-2 by FDA under an Emergency Use Authorization (EUA). This EUA will remain  in effect (meaning this test can be used) for the duration of the COVID-19 declaration under Section 564(b)(1) of the Act, 21 U.S.C. section 360bbb-3(b)(1), unless the authorization is terminated or revoked sooner.    Influenza A by PCR NEGATIVE NEGATIVE Final   Influenza B by PCR NEGATIVE NEGATIVE Final    Comment: (NOTE) The Xpert Xpress SARS-CoV-2/FLU/RSV assay is intended as an aid in  the diagnosis of influenza from Nasopharyngeal swab specimens and  should not be used as a sole basis for treatment. Nasal washings and  aspirates are unacceptable for Xpert Xpress SARS-CoV-2/FLU/RSV  testing. Fact Sheet for Patients: PinkCheek.be Fact Sheet for Healthcare Providers: GravelBags.it This test is not yet approved or cleared by the [place] FDA and  has been authorized for detection and/or diagnosis of SARS-CoV-2 by  FDA under an Emergency Use Authorization (EUA). This EUA will remain  in effect (meaning this test can be used) for the duration of the  Covid-19 declaration under Section 564(b)(1) of the Act, 21  U.S.C. section 360bbb-3(b)(1), unless the authorization is  terminated or revoked. Performed at [facility], [place] [address], [place], [place] [address]   Culture, blood (routine x 2)     Status: None   Collection Time: 09/20/19  5:03 AM   Specimen: BLOOD  Result Value Ref Range Status   Specimen Description   Final    BLOOD RIGHT ANTECUBITAL Performed at [facility] [address], [place], [place] [address]    Special Requests   Final    BOTTLES DRAWN AEROBIC AND ANAEROBIC Blood Culture adequate volume Performed at [facility] [address], [place], [place] [address]    Culture   Final    NO GROWTH 5 DAYS Performed at [facility], [place] [address] [place], [place] [address]    Report Status 09/25/2019 FINAL  Final  Culture, blood (routine x 2)     Status: None   Collection Time: 09/20/19  5:03 AM   Specimen: BLOOD RIGHT HAND  Result Value Ref Range Status   Specimen Description   Final    BLOOD RIGHT HAND Performed at [facility] [address] [place], [place] [address]    Special Requests   Final    BOTTLES DRAWN AEROBIC ONLY Blood Culture adequate volume Performed at [facility] [address] [place], [place] [address]    Culture   Final    NO GROWTH 5 DAYS Performed at [facility], [place] [address] [place], [place] [address]  Report Status 09/25/2019 FINAL  Final  SARS CORONAVIRUS 2 (TAT 6-24 HRS) Nasopharyngeal Nasopharyngeal Swab     Status: None   Collection Time: 09/24/19 12:44 PM   Specimen: Nasopharyngeal Swab  Result Value Ref Range Status   SARS Coronavirus 2 NEGATIVE NEGATIVE Final    Comment: (NOTE) SARS-CoV-2 target nucleic acids are NOT DETECTED. The SARS-CoV-2 RNA is generally detectable in upper and lower respiratory specimens during the acute phase of infection. Negative results do not preclude SARS-CoV-2 infection, do not rule out co-infections with other pathogens, and should not be used as the sole basis for treatment or other patient management decisions. Negative results must be combined with clinical observations, patient history, and epidemiological information. The expected result is Negative. Fact Sheet for Patients: SugarRoll.be Fact Sheet for Healthcare  Providers: https://www.woods-mathews.com/ This test is not yet approved or cleared by the [place] FDA and  has been authorized for detection and/or diagnosis of SARS-CoV-2 by FDA under an Emergency Use Authorization (EUA). This EUA will remain  in effect (meaning this test can be used) for the duration of the COVID-19 declaration under Section 56 4(b)(1) of the Act, 21 U.S.C. section 360bbb-3(b)(1), unless the authorization is terminated or revoked sooner. Performed at [facility], [place] [address], [place], [place] [address]      Labs: Basic Metabolic Panel: Recent Labs  Lab 09/21/19 0401 09/21/19 0401 09/22/19 0522 09/22/19 1052 09/23/19 0543 09/24/19 0355 09/25/19 0630  NA 143  --  143 141 142 138  --   K 3.6   < > 3.2* 3.3* 3.5 3.1* 3.7  CL 120*  --  110 111 112* 109  --   CO2 18*  --  18* 21* 20* 22  --   GLUCOSE 104*  --  110* 119* 100* 101*  --   BUN 26*  --  25* 27* 25* 23  --   CREATININE 1.00  --  0.89 0.91 0.81 0.93  --   CALCIUM 8.0*  --  7.8* 8.1* 8.1* 7.7*  --   MG  --   --  1.8  --   --   --  1.8  PHOS  --   --  2.3*  --   --   --   --    < > = values in this interval not displayed.   Liver Function Tests: Recent Labs  Lab 09/18/19 1137  AST 19  ALT 16  ALKPHOS 77  BILITOT 1.3*  PROT 6.9  ALBUMIN 3.9   No results for input(s): LIPASE, AMYLASE in the last 168 hours. No results for input(s): AMMONIA in the last 168 hours. CBC: Recent Labs  Lab 09/20/19 0500 09/20/19 0500 09/21/19 0401 09/21/19 1607 09/22/19 0522 09/23/19 0543 09/24/19 0355 09/25/19 0358  WBC 8.8   < > 6.7  --  8.9 8.3 8.3 8.4  NEUTROABS 7.5  --  5.2  --  6.9 6.3 6.0  --   HGB 8.6*   < > 9.3*  --  9.7* 9.0* 8.8* 8.9*  HCT 28.1*   < > 30.7* REJ5 30.8* 29.2* 27.7* 27.7*  MCV 104.1*   < > 101.3*  --  100.7* 100.0 98.9 98.2  PLT 132*   < > 136*  --  133* 151 164 188   < > = values in this interval not displayed.   Cardiac Enzymes: No results for  input(s): CKTOTAL, CKMB, CKMBINDEX, TROPONINI in the last 168 hours. BNP: BNP (last 3 results)  No results for input(s): BNP in the last 8760 hours.  ProBNP (last 3 results) No results for input(s): PROBNP in the last 8760 hours.  CBG: No results for input(s): GLUCAP in the last 168 hours.     Signed:  Kayleen Memos, MD [facility] 09/25/2019, 11:08 AM

## 2019-09-30 ENCOUNTER — Encounter (HOSPITAL_COMMUNITY): Payer: Self-pay

## 2019-09-30 ENCOUNTER — Other Ambulatory Visit: Payer: Self-pay

## 2019-09-30 ENCOUNTER — Emergency Department (HOSPITAL_COMMUNITY)
Admission: EM | Admit: 2019-09-30 | Discharge: 2019-09-30 | Disposition: A | Discharge status: Home / Self Care | Payer: Medicare Other | Attending: Emergency Medicine | Admitting: Emergency Medicine

## 2019-09-30 DIAGNOSIS — Y828 Other medical devices associated with adverse incidents: Secondary | ICD-10-CM | POA: Insufficient documentation

## 2019-09-30 DIAGNOSIS — E039 Hypothyroidism, unspecified: Secondary | ICD-10-CM | POA: Diagnosis not present

## 2019-09-30 DIAGNOSIS — N189 Chronic kidney disease, unspecified: Secondary | ICD-10-CM | POA: Insufficient documentation

## 2019-09-30 DIAGNOSIS — I130 Hypertensive heart and chronic kidney disease with heart failure and stage 1 through stage 4 chronic kidney disease, or unspecified chronic kidney disease: Secondary | ICD-10-CM | POA: Diagnosis not present

## 2019-09-30 DIAGNOSIS — R339 Retention of urine, unspecified: Secondary | ICD-10-CM | POA: Diagnosis not present

## 2019-09-30 DIAGNOSIS — N3000 Acute cystitis without hematuria: Secondary | ICD-10-CM | POA: Insufficient documentation

## 2019-09-30 DIAGNOSIS — Z79899 Other long term (current) drug therapy: Secondary | ICD-10-CM | POA: Insufficient documentation

## 2019-09-30 DIAGNOSIS — I5032 Chronic diastolic (congestive) heart failure: Secondary | ICD-10-CM | POA: Diagnosis not present

## 2019-09-30 DIAGNOSIS — T83021A Displacement of indwelling urethral catheter, initial encounter: Secondary | ICD-10-CM | POA: Diagnosis present

## 2019-09-30 DIAGNOSIS — T839XXA Unspecified complication of genitourinary prosthetic device, implant and graft, initial encounter: Secondary | ICD-10-CM

## 2019-09-30 DIAGNOSIS — F0391 Unspecified dementia with behavioral disturbance: Secondary | ICD-10-CM | POA: Diagnosis not present

## 2019-09-30 DIAGNOSIS — Z7901 Long term (current) use of anticoagulants: Secondary | ICD-10-CM | POA: Diagnosis not present

## 2019-09-30 LAB — URINALYSIS, ROUTINE W REFLEX MICROSCOPIC
Bilirubin Urine: NEGATIVE
Glucose, UA: NEGATIVE mg/dL
Ketones, ur: 20 mg/dL — AB
Nitrite: POSITIVE — AB
Protein, ur: 100 mg/dL — AB
Specific Gravity, Urine: 1.014 (ref 1.005–1.030)
WBC, UA: >50 WBC/hpf — ABNORMAL HIGH (ref 0–5)
pH: 7 (ref 5.0–8.0)

## 2019-09-30 LAB — BASIC METABOLIC PANEL WITH GFR
Anion gap: 11 (ref 5–15)
BUN: 17 mg/dL (ref 8–23)
CO2: 25 mmol/L (ref 22–32)
Calcium: 8.6 mg/dL — ABNORMAL LOW (ref 8.9–10.3)
Chloride: 102 mmol/L (ref 98–111)
Creatinine, Ser: 0.69 mg/dL (ref 0.44–1.00)
GFR calc Af Amer: >60 mL/min
GFR calc non Af Amer: >60 mL/min
Glucose, Bld: 93 mg/dL (ref 70–99)
Potassium: 3.5 mmol/L (ref 3.5–5.1)
Sodium: 138 mmol/L (ref 135–145)

## 2019-09-30 LAB — CBC
HCT: 36.4 % (ref 36.0–46.0)
Hemoglobin: 11.5 g/dL — ABNORMAL LOW (ref 12.0–15.0)
MCH: 31 pg (ref 26.0–34.0)
MCHC: 31.6 g/dL (ref 30.0–36.0)
MCV: 98.1 fL (ref 80.0–100.0)
Platelets: 346 10*3/uL (ref 150–400)
RBC: 3.71 MIL/uL — ABNORMAL LOW (ref 3.87–5.11)
RDW: 12.7 % (ref 11.5–15.5)
WBC: 13.6 10*3/uL — ABNORMAL HIGH (ref 4.0–10.5)
nRBC: 0 % (ref 0.0–0.2)

## 2019-09-30 MED ORDER — NITROFURANTOIN MONOHYD MACRO 100 MG PO CAPS: 100.0000 mg | ORAL_CAPSULE | Freq: Two times a day (BID) | ORAL | 0 refills | Status: DC

## 2019-09-30 MED ORDER — NITROFURANTOIN MONOHYD MACRO 100 MG PO CAPS
100.0000 mg | ORAL_CAPSULE | Freq: Once | ORAL | Status: AC
  Administered 2019-09-30: 14:00:00 100 mg via ORAL
  Filled 2019-09-30: qty 1

## 2019-09-30 NOTE — Discharge Instructions (Signed)
Take the antibiotics as prescribed, the foley catheter was replaced.  Follow up with your doctor to make sure the infection resolves

## 2019-09-30 NOTE — ED Provider Notes (Signed)
[facility] DEPT Provider Note   CSN: [id number] Arrival date & time: 09/30/19  1153     History Chief Complaint  Patient presents with  . Urinary catheter out    Victoria Walsh is a 86 y.o. female.  HPI   Patient presents to the ED for evaluation of a displaced Foley catheter.  According to the medical records the patient was admitted to the hospital on February 2.  She was treated during her hospital stay for altered mental status felt to be associated with urosepsis.  Patient also had a Foley catheter that is not draining properly at that time and was replaced.  Patient was treated for ESBL E. coli bacteremia.  According to the EMS report the patient was sent to the ED today because her Foley catheter fell out.  According to the EMS report they did not notice the catheter yesterday or today.  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  She is not sure why she is here.  She denies any specific complaints.  Past Medical History:  Diagnosis Date  . Chronic kidney disease   . CVA (cerebral infarction)   . Dementia ([place])   . Hyperlipidemia   . Osteopenia   . UTI (lower urinary tract infection)     Patient Active Problem List   Diagnosis Date Noted  . PAF (paroxysmal atrial fibrillation) ([place])   . Atrial fibrillation with RVR ([place])   . Chronic diastolic CHF (congestive heart failure) ([place])   . Coronary artery disease involving native coronary artery of native heart without angina pectoris   . Essential hypertension   . Acute lower UTI 09/18/2019  . Sepsis ([place]) 09/18/2019  . AKI (acute kidney injury) ([place]) 09/18/2019  . Dementia with behavioral disturbance ([place]) 09/18/2019  . Ischemic stroke ([place]) 09/18/2019  . Hypothyroidism 09/18/2019  . Breast cancer ([place]) 09/18/2019    Past Surgical History:  Procedure Laterality Date  . ABDOMINAL HYSTERECTOMY    . BREAST LUMPECTOMY    . REPLACEMENT TOTAL  KNEE    . TOTAL HIP ARTHROPLASTY       OB History   No obstetric history on file.     History reviewed. No pertinent family history.  Social History   Tobacco Use  . Smoking status: Never Smoker  . Smokeless tobacco: Never Used  Substance Use Topics  . Alcohol use: Yes    Comment: socially: Once every month  . Drug use: No    Home Medications Prior to Admission medications   Medication Sig Start Date End Date Taking? Authorizing Provider  apixaban (ELIQUIS) 5 MG TABS tablet Take 1 tablet (5 mg total) by mouth 2 (two) times daily. 09/25/19 11/24/19  Kayleen Memos, DO  atorvastatin (LIPITOR) 20 MG tablet Take 1 tablet (20 mg total) by mouth daily at 6 PM. 09/25/19 10/25/19  Kayleen Memos, DO  brimonidine-timolol (COMBIGAN) 0.2-0.5 % ophthalmic solution Place 1 drop into the left eye every 12 (twelve) hours.    [provider]  brinzolamide (AZOPT) 1 % ophthalmic suspension Place 1 drop into the left eye 2 (two) times daily.    [provider]  camphor-menthol Timoteo Ace) lotion Apply 1 application topically every 8 (eight) hours as needed for itching. Apply to left ear topically for itching unsupervised self administration may keep in room and self applyprn    [provider]  Cranberry 450 MG CAPS Take 450 mg by mouth 2 (two) times  daily.    [provider]  diltiazem (CARDIZEM CD) 180 MG 24 hr capsule Take 1 capsule (180 mg total) by mouth daily. 09/26/19 10/26/19  Kayleen Memos, DO  docusate sodium (COLACE) 100 MG capsule Take 100 mg by mouth daily.    [provider]  DULoxetine (CYMBALTA) 20 MG capsule Take 20 mg by mouth at bedtime.     [provider]  latanoprost (XALATAN) 0.005 % ophthalmic solution Place 1 drop into the left eye at bedtime.    [provider]  letrozole (FEMARA) 2.5 MG tablet Take 2.5 mg by mouth daily.    [provider]  levothyroxine (SYNTHROID, LEVOTHROID) 100 MCG tablet Take 100 mcg by  mouth daily before breakfast.     [provider]  Melatonin 3 MG TABS Take 3 mg by mouth at bedtime.    [provider]  Menthol-Zinc Oxide (CALMOSEPTINE) 0.44-20.6 % OINT Apply 1 application topically 2 (two) times daily. Apply to buttocks for rednedd    [provider]  metoprolol tartrate (LOPRESSOR) 25 MG tablet Take 0.5 tablets (12.5 mg total) by mouth 2 (two) times daily. 09/25/19 10/25/19  Kayleen Memos, DO  Multiple Vitamins-Iron (MULTIVITAMINS WITH IRON) TABS tablet Take 1 tablet by mouth at bedtime.    [provider]  nitrofurantoin, macrocrystal-monohydrate, (MACROBID) 100 MG capsule Take 1 capsule (100 mg total) by mouth 2 (two) times daily. 09/30/19   Dorie Rank, MD  pantoprazole (PROTONIX) 40 MG tablet Take 1 tablet (40 mg total) by mouth daily. 09/26/19 10/26/19  Kayleen Memos, DO  pregabalin (LYRICA) 25 MG capsule Take 25 mg by mouth at bedtime.    [provider]  Psyllium 500 MG CAPS Take 500 mg by mouth at bedtime.     [provider]  senna (SENOKOT) 8.6 MG TABS tablet Take 1 tablet by mouth See admin instructions. Monday, Wednesday and Friday in the evening    [provider]    Allergies    Ciprofloxacin, Oxaprozin, and Sulfa antibiotics  Review of Systems   Review of Systems  All other systems reviewed and are negative.   Physical Exam Updated Vital Signs BP (!) 158/65 (BP Location: Right Arm)   Pulse 63   Temp 97.7 F (36.5 C) (Oral)   Resp 16   Wt 82.7 kg   SpO2 98%   BMI 27.72 kg/m   Physical Exam Vitals and nursing note reviewed.  Constitutional:      General: She is not in acute distress.    Appearance: She is well-developed.  HENT:     Head: Normocephalic and atraumatic.     Right Ear: External ear normal.     Left Ear: External ear normal.  Eyes:     General: No scleral icterus.       Right eye: No discharge.        Left eye: No discharge.     Conjunctiva/sclera: Conjunctivae  normal.  Neck:     Trachea: No tracheal deviation.  Cardiovascular:     Rate and Rhythm: Normal rate and regular rhythm.  Pulmonary:     Effort: Pulmonary effort is normal. No respiratory distress.     Breath sounds: Normal breath sounds. No stridor. No wheezing or rales.  Abdominal:     General: Bowel sounds are normal. There is no distension.     Palpations: Abdomen is soft.     Tenderness: There is no abdominal tenderness. There is no guarding or rebound.  Musculoskeletal:        General: No tenderness.     Cervical back: Neck supple.  Skin:    General: Skin is warm and dry.     Findings: No rash.  Neurological:     Mental Status: She is alert.     Cranial Nerves: No cranial nerve deficit (no facial droop, extraocular movements intact, no slurred speech).     Sensory: No sensory deficit.     Motor: Weakness and abnormal muscle tone present. No seizure activity.     Comments: Left sided weakness, contracture left ue, alert and answers questions, confused about why she is here     ED Results / Procedures / Treatments   Labs (all labs ordered are listed, but only abnormal results are displayed) Labs Reviewed  CBC - Abnormal; Notable for the following components:      Result Value   WBC 13.6 (*)    RBC 3.71 (*)    Hemoglobin 11.5 (*)    All other components within normal limits  BASIC METABOLIC PANEL - Abnormal; Notable for the following components:   Calcium 8.6 (*)    All other components within normal limits  URINALYSIS, ROUTINE W REFLEX MICROSCOPIC - Abnormal; Notable for the following components:   APPearance TURBID (*)    Hgb urine dipstick SMALL (*)    Ketones, ur 20 (*)    Protein, ur 100 (*)    Nitrite POSITIVE (*)    Leukocytes,Ua MODERATE (*)    WBC, UA >50 (*)    Bacteria, UA RARE (*)    Non Squamous Epithelial 0-5 (*)    All other components within normal limits    EKG None  Radiology No results found.  Procedures Procedures (including critical  care time)  Medications Ordered in ED Medications  nitrofurantoin (macrocrystal-monohydrate) (MACROBID) capsule 100 mg (has no administration in time range)    ED Course  I have reviewed the triage vital signs and the nursing notes.  Pertinent labs & imaging results that were available during my care of the patient were reviewed by me and considered in my medical decision making (see chart for details).  Clinical Course as of Sep 29 1356  Sun Sep 30, 2019  1342 Urinalysis suggestive of UTI.   [id number] White blood cell count elevated.  Electrolyte panel unremarkable.   [JK]  1352 Recent urine culture reviewed.  E. coli with multiple drug resistance.  Sensitive to imipenem, PIP [place], nitrofurantoin and gentamicin.   [JK]    Clinical Course User Index [JK] Dorie Rank, MD   MDM Rules/Calculators/A&P                      Patient presented to the ED for evaluation of a displaced Foley catheter.  Medical records reviewed and it appears the patient left the hospital on Friday with a Foley catheter in place.  She has history of stroke and urinary retention and had an indwelling catheter prior to her recent hospitalization.  ED work-up is notable for urinary tract infection.  Foley catheter was replaced while the patient was in the ED.  It is draining adequately.  I reviewed her recent cultures and I will start her on Macrobid as the E. coli was sensitive to this last time she was in the hospital.  Patient is nontoxic and afebrile.  No signs of sepsis.  At this time there does not appear to be any evidence of an acute emergency medical  condition and the patient appears stable for discharge with appropriate outpatient follow up.  Final Clinical Impression(s) / ED Diagnoses Final diagnoses:  Problem with Foley catheter, initial encounter ([place])  Acute cystitis without hematuria    Rx / DC Orders ED Discharge Orders         Ordered    nitrofurantoin, macrocrystal-monohydrate, (MACROBID)  100 MG capsule  2 times daily     09/30/19 1355           Dorie Rank, MD 09/30/19 1358

## 2019-09-30 NOTE — ED Triage Notes (Addendum)
Pt to ED by [facility] with c/o of urinary catheter out. Pt was here Friday for the same issue. Foley was placed and when patient returned home Friday foley was present. At some point Catheter fell out and wasn't present yesterday or therefore today. Pt has hx of Dementia and CVa, but answering appropriately today.  Left arm contracted and restricted and deficits.

## 2019-10-03 ENCOUNTER — Encounter: Payer: Medicare Other | Admitting: Physician Assistant

## 2019-10-03 NOTE — Progress Notes (Signed)
This encounter was created in error - please disregard.

## 2019-10-10 ENCOUNTER — Encounter (HOSPITAL_COMMUNITY): Payer: Self-pay | Admitting: Emergency Medicine

## 2019-10-10 ENCOUNTER — Other Ambulatory Visit: Payer: Self-pay

## 2019-10-10 ENCOUNTER — Inpatient Hospital Stay (HOSPITAL_COMMUNITY)
Admission: EM | Admit: 2019-10-10 | Discharge: 2019-10-13 | DRG: 698 | Disposition: A | Discharge status: Skilled Nursing Facility | Payer: Medicare Other | Source: Skilled Nursing Facility | Attending: Internal Medicine | Admitting: Internal Medicine

## 2019-10-10 ENCOUNTER — Emergency Department (HOSPITAL_COMMUNITY): Payer: Medicare Other

## 2019-10-10 DIAGNOSIS — E876 Hypokalemia: Secondary | ICD-10-CM | POA: Diagnosis not present

## 2019-10-10 DIAGNOSIS — M8580 Other specified disorders of bone density and structure, unspecified site: Secondary | ICD-10-CM | POA: Diagnosis present

## 2019-10-10 DIAGNOSIS — Y846 Urinary catheterization as the cause of abnormal reaction of the patient, or of later complication, without mention of misadventure at the time of the procedure: Secondary | ICD-10-CM | POA: Diagnosis present

## 2019-10-10 DIAGNOSIS — I10 Essential (primary) hypertension: Secondary | ICD-10-CM | POA: Diagnosis present

## 2019-10-10 DIAGNOSIS — E785 Hyperlipidemia, unspecified: Secondary | ICD-10-CM | POA: Diagnosis present

## 2019-10-10 DIAGNOSIS — I2510 Atherosclerotic heart disease of native coronary artery without angina pectoris: Secondary | ICD-10-CM | POA: Diagnosis present

## 2019-10-10 DIAGNOSIS — Z79890 Hormone replacement therapy (postmenopausal): Secondary | ICD-10-CM | POA: Diagnosis not present

## 2019-10-10 DIAGNOSIS — I69354 Hemiplegia and hemiparesis following cerebral infarction affecting left non-dominant side: Secondary | ICD-10-CM | POA: Diagnosis not present

## 2019-10-10 DIAGNOSIS — F0390 Unspecified dementia without behavioral disturbance: Secondary | ICD-10-CM | POA: Diagnosis present

## 2019-10-10 DIAGNOSIS — E860 Dehydration: Secondary | ICD-10-CM | POA: Diagnosis present

## 2019-10-10 DIAGNOSIS — Z87440 Personal history of urinary (tract) infections: Secondary | ICD-10-CM

## 2019-10-10 DIAGNOSIS — Z79811 Long term (current) use of aromatase inhibitors: Secondary | ICD-10-CM | POA: Diagnosis not present

## 2019-10-10 DIAGNOSIS — T83511A Infection and inflammatory reaction due to indwelling urethral catheter, initial encounter: Secondary | ICD-10-CM | POA: Diagnosis not present

## 2019-10-10 DIAGNOSIS — N179 Acute kidney failure, unspecified: Secondary | ICD-10-CM | POA: Diagnosis present

## 2019-10-10 DIAGNOSIS — N319 Neuromuscular dysfunction of bladder, unspecified: Secondary | ICD-10-CM | POA: Diagnosis present

## 2019-10-10 DIAGNOSIS — I5032 Chronic diastolic (congestive) heart failure: Secondary | ICD-10-CM | POA: Diagnosis present

## 2019-10-10 DIAGNOSIS — Z882 Allergy status to sulfonamides status: Secondary | ICD-10-CM

## 2019-10-10 DIAGNOSIS — F0391 Unspecified dementia with behavioral disturbance: Secondary | ICD-10-CM | POA: Diagnosis present

## 2019-10-10 DIAGNOSIS — K5900 Constipation, unspecified: Secondary | ICD-10-CM | POA: Diagnosis present

## 2019-10-10 DIAGNOSIS — I130 Hypertensive heart and chronic kidney disease with heart failure and stage 1 through stage 4 chronic kidney disease, or unspecified chronic kidney disease: Secondary | ICD-10-CM | POA: Diagnosis present

## 2019-10-10 DIAGNOSIS — F03918 Unspecified dementia, unspecified severity, with other behavioral disturbance: Secondary | ICD-10-CM | POA: Diagnosis present

## 2019-10-10 DIAGNOSIS — E039 Hypothyroidism, unspecified: Secondary | ICD-10-CM | POA: Diagnosis present

## 2019-10-10 DIAGNOSIS — N1831 Chronic kidney disease, stage 3a: Secondary | ICD-10-CM | POA: Diagnosis present

## 2019-10-10 DIAGNOSIS — G9341 Metabolic encephalopathy: Secondary | ICD-10-CM | POA: Diagnosis present

## 2019-10-10 DIAGNOSIS — Z993 Dependence on wheelchair: Secondary | ICD-10-CM

## 2019-10-10 DIAGNOSIS — Z881 Allergy status to other antibiotic agents status: Secondary | ICD-10-CM

## 2019-10-10 DIAGNOSIS — Z7901 Long term (current) use of anticoagulants: Secondary | ICD-10-CM

## 2019-10-10 DIAGNOSIS — N390 Urinary tract infection, site not specified: Secondary | ICD-10-CM | POA: Diagnosis present

## 2019-10-10 DIAGNOSIS — Z20822 Contact with and (suspected) exposure to covid-19: Secondary | ICD-10-CM | POA: Diagnosis present

## 2019-10-10 DIAGNOSIS — I480 Paroxysmal atrial fibrillation: Secondary | ICD-10-CM | POA: Diagnosis present

## 2019-10-10 DIAGNOSIS — Z79899 Other long term (current) drug therapy: Secondary | ICD-10-CM

## 2019-10-10 DIAGNOSIS — Z66 Do not resuscitate: Secondary | ICD-10-CM | POA: Diagnosis present

## 2019-10-10 HISTORY — DX: Cerebral infarction, unspecified: I63.9

## 2019-10-10 LAB — BASIC METABOLIC PANEL
Anion gap: 11 (ref 5–15)
BUN: 71 mg/dL — ABNORMAL HIGH (ref 8–23)
CO2: 25 mmol/L (ref 22–32)
Calcium: 9 mg/dL (ref 8.9–10.3)
Chloride: 105 mmol/L (ref 98–111)
Creatinine, Ser: 1.77 mg/dL — ABNORMAL HIGH (ref 0.44–1.00)
GFR calc Af Amer: 30 mL/min — ABNORMAL LOW (ref >60)
GFR calc non Af Amer: 26 mL/min — ABNORMAL LOW (ref >60)
Glucose, Bld: 131 mg/dL — ABNORMAL HIGH (ref 70–99)
Potassium: 4.1 mmol/L (ref 3.5–5.1)
Sodium: 141 mmol/L (ref 135–145)

## 2019-10-10 LAB — URINALYSIS, ROUTINE W REFLEX MICROSCOPIC
RBC / HPF: >50 RBC/hpf — ABNORMAL HIGH (ref 0–5)
WBC, UA: >50 WBC/hpf — ABNORMAL HIGH (ref 0–5)

## 2019-10-10 LAB — CBC WITH DIFFERENTIAL/PLATELET
Abs Immature Granulocytes: 0.1 10*3/uL — ABNORMAL HIGH (ref 0.00–0.07)
Basophils Absolute: 0.1 10*3/uL (ref 0.0–0.1)
Basophils Relative: 1 %
Eosinophils Absolute: 0.5 10*3/uL (ref 0.0–0.5)
Eosinophils Relative: 5 %
HCT: 34.7 % — ABNORMAL LOW (ref 36.0–46.0)
Hemoglobin: 10.9 g/dL — ABNORMAL LOW (ref 12.0–15.0)
Immature Granulocytes: 1 %
Lymphocytes Relative: 16 %
Lymphs Abs: 1.7 10*3/uL (ref 0.7–4.0)
MCH: 31.1 pg (ref 26.0–34.0)
MCHC: 31.4 g/dL (ref 30.0–36.0)
MCV: 98.9 fL (ref 80.0–100.0)
Monocytes Absolute: 0.9 10*3/uL (ref 0.1–1.0)
Monocytes Relative: 8 %
Neutro Abs: 7 10*3/uL (ref 1.7–7.7)
Neutrophils Relative %: 69 %
Platelets: 330 10*3/uL (ref 150–400)
RBC: 3.51 MIL/uL — ABNORMAL LOW (ref 3.87–5.11)
RDW: 13.3 % (ref 11.5–15.5)
WBC: 10.2 10*3/uL (ref 4.0–10.5)
nRBC: 0 % (ref 0.0–0.2)

## 2019-10-10 MED ORDER — ONDANSETRON HCL 4 MG PO TABS: 4.0000 mg | ORAL_TABLET | Freq: Four times a day (QID) | ORAL | Status: DC | PRN

## 2019-10-10 MED ORDER — SODIUM CHLORIDE 0.9 % IV SOLN: INTRAVENOUS | Status: DC

## 2019-10-10 MED ORDER — ACETAMINOPHEN 325 MG PO TABS: 650.0000 mg | ORAL_TABLET | Freq: Four times a day (QID) | ORAL | Status: DC | PRN

## 2019-10-10 MED ORDER — ZOLPIDEM TARTRATE 5 MG PO TABS: 5.0000 mg | ORAL_TABLET | Freq: Every evening | ORAL | Status: DC | PRN

## 2019-10-10 MED ORDER — LATANOPROST 0.005 % OP SOLN
1.0000 [drp] | Freq: Every day | OPHTHALMIC | Status: DC
  Administered 2019-10-10 – 2019-10-13 (×3): 1 [drp] via OPHTHALMIC
  Filled 2019-10-10: qty 2.5

## 2019-10-10 MED ORDER — ENSURE ENLIVE PO LIQD: 237.0000 mL | Freq: Every day | ORAL | Status: DC

## 2019-10-10 MED ORDER — MELATONIN 3 MG PO TABS
3.0000 mg | ORAL_TABLET | Freq: Every day | ORAL | Status: DC
  Administered 2019-10-10 – 2019-10-13 (×3): 3 mg via ORAL
  Filled 2019-10-10 (×3): qty 1

## 2019-10-10 MED ORDER — ZINC OXIDE 40 % EX OINT
1.0000 "application " | TOPICAL_OINTMENT | Freq: Two times a day (BID) | CUTANEOUS | Status: DC
  Administered 2019-10-11 – 2019-10-13 (×4): 1 via TOPICAL
  Filled 2019-10-10: qty 57

## 2019-10-10 MED ORDER — DILTIAZEM HCL ER COATED BEADS 180 MG PO CP24
180.0000 mg | ORAL_CAPSULE | Freq: Every day | ORAL | Status: DC
  Administered 2019-10-11 – 2019-10-12 (×2): 180 mg via ORAL
  Filled 2019-10-10 (×2): qty 1

## 2019-10-10 MED ORDER — SODIUM CHLORIDE 0.9 % IV BOLUS
500.0000 mL | Freq: Once | INTRAVENOUS | Status: AC
  Administered 2019-10-10: 20:00:00 500 mL via INTRAVENOUS

## 2019-10-10 MED ORDER — ADULT MULTIVITAMIN W/MINERALS CH
1.0000 | ORAL_TABLET | Freq: Every day | ORAL | Status: DC
  Administered 2019-10-10 – 2019-10-13 (×3): 1 via ORAL
  Filled 2019-10-10 (×3): qty 1

## 2019-10-10 MED ORDER — CRANBERRY 450 MG PO CAPS: 450.0000 mg | ORAL_CAPSULE | Freq: Two times a day (BID) | ORAL | Status: DC

## 2019-10-10 MED ORDER — ACETAMINOPHEN 325 MG PO TABS
650.0000 mg | ORAL_TABLET | Freq: Four times a day (QID) | ORAL | Status: DC | PRN
  Administered 2019-10-11: 11:00:00 650 mg via ORAL

## 2019-10-10 MED ORDER — ACETAMINOPHEN 650 MG RE SUPP: 650.0000 mg | Freq: Four times a day (QID) | RECTAL | Status: DC | PRN

## 2019-10-10 MED ORDER — TIMOLOL MALEATE 0.5 % OP SOLN
1.0000 [drp] | Freq: Two times a day (BID) | OPHTHALMIC | Status: DC
  Administered 2019-10-10 – 2019-10-13 (×6): 1 [drp] via OPHTHALMIC
  Filled 2019-10-10: qty 5

## 2019-10-10 MED ORDER — ATORVASTATIN CALCIUM 10 MG PO TABS
20.0000 mg | ORAL_TABLET | Freq: Every day | ORAL | Status: DC
  Administered 2019-10-11 (×2): 20 mg via ORAL
  Filled 2019-10-10: qty 2

## 2019-10-10 MED ORDER — DULOXETINE HCL 20 MG PO CPEP
20.0000 mg | ORAL_CAPSULE | Freq: Every day | ORAL | Status: DC
  Administered 2019-10-10 – 2019-10-11 (×2): 20 mg via ORAL
  Filled 2019-10-10 (×4): qty 1

## 2019-10-10 MED ORDER — BRIMONIDINE TARTRATE-TIMOLOL 0.2-0.5 % OP SOLN: 1.0000 [drp] | Freq: Two times a day (BID) | OPHTHALMIC | Status: DC

## 2019-10-10 MED ORDER — SORBITOL 70 % SOLN
30.0000 mL | Status: DC | PRN
  Filled 2019-10-10: qty 30

## 2019-10-10 MED ORDER — HYDROXYZINE HCL 25 MG PO TABS: 25.0000 mg | ORAL_TABLET | Freq: Three times a day (TID) | ORAL | Status: DC | PRN

## 2019-10-10 MED ORDER — NEPRO/CARBSTEADY PO LIQD
237.0000 mL | Freq: Three times a day (TID) | ORAL | Status: DC | PRN
  Filled 2019-10-10: qty 237

## 2019-10-10 MED ORDER — SENNA 8.6 MG PO TABS
1.0000 | ORAL_TABLET | ORAL | Status: DC
  Administered 2019-10-11: 8.6 mg via ORAL

## 2019-10-10 MED ORDER — CAMPHOR-MENTHOL 0.5-0.5 % EX LOTN
1.0000 "application " | TOPICAL_LOTION | Freq: Three times a day (TID) | CUTANEOUS | Status: DC | PRN
  Filled 2019-10-10: qty 222

## 2019-10-10 MED ORDER — PREGABALIN 25 MG PO CAPS
25.0000 mg | ORAL_CAPSULE | Freq: Every day | ORAL | Status: DC
  Administered 2019-10-10 – 2019-10-13 (×3): 25 mg via ORAL
  Filled 2019-10-10 (×3): qty 1

## 2019-10-10 MED ORDER — BRINZOLAMIDE 1 % OP SUSP
1.0000 [drp] | Freq: Two times a day (BID) | OPHTHALMIC | Status: DC
  Administered 2019-10-10 – 2019-10-13 (×6): 1 [drp] via OPHTHALMIC
  Filled 2019-10-10: qty 10

## 2019-10-10 MED ORDER — CALCIUM CARBONATE ANTACID 500 MG PO CHEW: 500.0000 mg | CHEWABLE_TABLET | Freq: Four times a day (QID) | ORAL | Status: DC | PRN

## 2019-10-10 MED ORDER — PANTOPRAZOLE SODIUM 40 MG PO TBEC
40.0000 mg | DELAYED_RELEASE_TABLET | Freq: Every day | ORAL | Status: DC
  Administered 2019-10-11 – 2019-10-12 (×2): 40 mg via ORAL
  Filled 2019-10-10 (×2): qty 1

## 2019-10-10 MED ORDER — SODIUM CHLORIDE 0.9 % IV SOLN
1.0000 g | Freq: Two times a day (BID) | INTRAVENOUS | Status: DC
  Administered 2019-10-11 – 2019-10-13 (×6): 1 g via INTRAVENOUS
  Filled 2019-10-10 (×7): qty 1

## 2019-10-10 MED ORDER — LEVOTHYROXINE SODIUM 100 MCG PO TABS
100.0000 ug | ORAL_TABLET | Freq: Every day | ORAL | Status: DC
  Administered 2019-10-11 – 2019-10-12 (×2): 100 ug via ORAL
  Filled 2019-10-10 (×3): qty 1

## 2019-10-10 MED ORDER — BRIMONIDINE TARTRATE 0.2 % OP SOLN
1.0000 [drp] | Freq: Two times a day (BID) | OPHTHALMIC | Status: DC
  Administered 2019-10-10 – 2019-10-13 (×6): 1 [drp] via OPHTHALMIC
  Filled 2019-10-10: qty 5

## 2019-10-10 MED ORDER — ONDANSETRON HCL 4 MG/2ML IJ SOLN: 4.0000 mg | Freq: Four times a day (QID) | INTRAMUSCULAR | Status: DC | PRN

## 2019-10-10 MED ORDER — METOPROLOL TARTRATE 12.5 MG HALF TABLET
12.5000 mg | ORAL_TABLET | Freq: Two times a day (BID) | ORAL | Status: DC
  Administered 2019-10-10 – 2019-10-13 (×5): 12.5 mg via ORAL
  Filled 2019-10-10 (×5): qty 1

## 2019-10-10 MED ORDER — PSYLLIUM 95 % PO PACK
1.0000 | PACK | Freq: Every day | ORAL | Status: DC
  Administered 2019-10-10 – 2019-10-11 (×2): 1 via ORAL
  Filled 2019-10-10 (×4): qty 1

## 2019-10-10 MED ORDER — DOCUSATE SODIUM 283 MG RE ENEM
1.0000 | ENEMA | RECTAL | Status: DC | PRN
  Filled 2019-10-10: qty 1

## 2019-10-10 MED ORDER — DOCUSATE SODIUM 100 MG PO CAPS
100.0000 mg | ORAL_CAPSULE | Freq: Every day | ORAL | Status: DC
  Administered 2019-10-11 – 2019-10-12 (×2): 100 mg via ORAL
  Filled 2019-10-10 (×2): qty 1

## 2019-10-10 MED ORDER — LETROZOLE 2.5 MG PO TABS
2.5000 mg | ORAL_TABLET | Freq: Every day | ORAL | Status: DC
  Administered 2019-10-11 – 2019-10-12 (×2): 2.5 mg via ORAL
  Filled 2019-10-10 (×3): qty 1

## 2019-10-10 NOTE — ED Provider Notes (Signed)
[facility] DEPT Provider Note   CSN: [id number] Arrival date & time: 10/10/19  1543     History Chief Complaint  Patient presents with  . Hematuria    Victoria Walsh is a 86 y.o. female with past medical history significant for CKD, CVA, dementia, hyperlipidemia, A. fib on Eliquis presents to emergency room today via EMS with chief complaint of hematuria.  Patient is a resident at [facility] memory care facility.  Level 5 caveat applies given patient's dementia.  RN at [facility] provides history of the phone.  She states staff noticed patient had blood in her urine 2 days ago.  She was treated recently for UTI on 09/30/2019 with Macrobid given previous urine cultures and her allergy restrictions.  Staff reports she completed the antibiotics and her urine had appeared slightly pink but was not grossly bloody.  They have not seen any blood clots in the Foley bag.  They wanted patient to be evaluated for sepsis given her recent history of ESBL E. coli bacteremia. Patient is typically alert and oriented to self only.  They deny any change in mental status.  Current Foley was placed during last ED visit.  Also denies fever, vomiting, diarrhea.  Patient has history of a CVA with left-sided deficits.  Additional history obtained from chart review.     Past Medical History:  Diagnosis Date  . Chronic kidney disease   . CVA (cerebral infarction)   . Dementia ([place])   . Hyperlipidemia   . Osteopenia   . Stroke ([place])   . UTI (lower urinary tract infection)     Patient Active Problem List   Diagnosis Date Noted  . PAF (paroxysmal atrial fibrillation) ([place])   . Atrial fibrillation with RVR ([place])   . Chronic diastolic CHF (congestive heart failure) ([place])   . Coronary artery disease involving native coronary artery of native heart without angina pectoris   . Essential hypertension   . Acute lower UTI 09/18/2019  . Sepsis ([place]) 09/18/2019  . AKI (acute  kidney injury) ([place]) 09/18/2019  . Dementia with behavioral disturbance ([place]) 09/18/2019  . Ischemic stroke ([place]) 09/18/2019  . Hypothyroidism 09/18/2019  . Breast cancer ([place]) 09/18/2019    Past Surgical History:  Procedure Laterality Date  . ABDOMINAL HYSTERECTOMY    . BREAST LUMPECTOMY    . REPLACEMENT TOTAL KNEE    . TOTAL HIP ARTHROPLASTY       OB History   No obstetric history on file.     History reviewed. No pertinent family history.  Social History   Tobacco Use  . Smoking status: Never Smoker  . Smokeless tobacco: Never Used  Substance Use Topics  . Alcohol use: Yes    Comment: socially: Once every month  . Drug use: No    Home Medications Prior to Admission medications   Medication Sig Start Date End Date Taking? Authorizing Provider  apixaban (ELIQUIS) 5 MG TABS tablet Take 1 tablet (5 mg total) by mouth 2 (two) times daily. 09/25/19 11/24/19  Kayleen Memos, DO  atorvastatin (LIPITOR) 20 MG tablet Take 1 tablet (20 mg total) by mouth daily at 6 PM. 09/25/19 10/25/19  Kayleen Memos, DO  brimonidine-timolol (COMBIGAN) 0.2-0.5 % ophthalmic solution Place 1 drop into the left eye every 12 (twelve) hours.    [provider]  brinzolamide (AZOPT) 1 % ophthalmic suspension Place 1 drop into the left eye 2 (two) times daily.    [provider]  camphor-menthol (  SARNA) lotion Apply 1 application topically every 8 (eight) hours as needed for itching. Apply to left ear topically for itching unsupervised self administration may keep in room and self applyprn    [provider]  Cranberry 450 MG CAPS Take 450 mg by mouth 2 (two) times daily.    [provider]  diltiazem (CARDIZEM CD) 180 MG 24 hr capsule Take 1 capsule (180 mg total) by mouth daily. 09/26/19 10/26/19  Kayleen Memos, DO  docusate sodium (COLACE) 100 MG capsule Take 100 mg by mouth daily.    [provider]  DULoxetine (CYMBALTA) 20 MG capsule Take 20 mg by mouth at  bedtime.     [provider]  latanoprost (XALATAN) 0.005 % ophthalmic solution Place 1 drop into the left eye at bedtime.    [provider]  letrozole (FEMARA) 2.5 MG tablet Take 2.5 mg by mouth daily.    [provider]  levothyroxine (SYNTHROID, LEVOTHROID) 100 MCG tablet Take 100 mcg by mouth daily before breakfast.     [provider]  Melatonin 3 MG TABS Take 3 mg by mouth at bedtime.    [provider]  Menthol-Zinc Oxide (CALMOSEPTINE) 0.44-20.6 % OINT Apply 1 application topically 2 (two) times daily. Apply to buttocks for rednedd    [provider]  metoprolol tartrate (LOPRESSOR) 25 MG tablet Take 0.5 tablets (12.5 mg total) by mouth 2 (two) times daily. 09/25/19 10/25/19  Kayleen Memos, DO  Multiple Vitamins-Iron (MULTIVITAMINS WITH IRON) TABS tablet Take 1 tablet by mouth at bedtime.    [provider]  nitrofurantoin, macrocrystal-monohydrate, (MACROBID) 100 MG capsule Take 1 capsule (100 mg total) by mouth 2 (two) times daily. 09/30/19   Dorie Rank, MD  pantoprazole (PROTONIX) 40 MG tablet Take 1 tablet (40 mg total) by mouth daily. 09/26/19 10/26/19  Kayleen Memos, DO  pregabalin (LYRICA) 25 MG capsule Take 25 mg by mouth at bedtime.    [provider]  Psyllium 500 MG CAPS Take 500 mg by mouth at bedtime.     [provider]  senna (SENOKOT) 8.6 MG TABS tablet Take 1 tablet by mouth See admin instructions. Monday, Wednesday and Friday in the evening    [provider]    Allergies    Ciprofloxacin, Oxaprozin, and Sulfa antibiotics  Review of Systems   Review of Systems  Unable to perform ROS: Dementia      Physical Exam Updated Vital Signs BP (!) 107/59 (BP Location: Right Arm)   Pulse 73   Temp 97.8 F (36.6 C) (Oral)   SpO2 97%   Physical Exam Vitals and nursing note reviewed.  Constitutional:      General: She is not in acute distress.    Appearance: She is not ill-appearing.   HENT:     Head: Normocephalic and atraumatic.     Right Ear: Tympanic membrane and external ear normal.     Left Ear: Tympanic membrane and external ear normal.     Nose: Nose normal.     Mouth/Throat:     Mouth: Mucous membranes are moist.     Pharynx: Oropharynx is clear.  Eyes:     General: No scleral icterus.       Right eye: No discharge.        Left eye: No discharge.     Extraocular Movements: Extraocular movements intact.     Conjunctiva/sclera: Conjunctivae normal.     Pupils: Pupils are equal, round, and  reactive to light.  Neck:     Vascular: No JVD.  Cardiovascular:     Rate and Rhythm: Normal rate and regular rhythm.     Pulses: Normal pulses.          Radial pulses are 2+ on the right side and 2+ on the left side.     Heart sounds: Normal heart sounds.  Pulmonary:     Comments: Lungs clear to auscultation in all fields. Symmetric chest rise. No wheezing, rales, or rhonchi. Abdominal:     Tenderness: There is no right CVA tenderness or left CVA tenderness.     Comments: Abdomen is soft, non-distended, and non-tender in all quadrants. No rigidity, no guarding. No peritoneal signs.  Genitourinary:    Comments: Foley catheter in place.  Gross bloody urine in Foley bag.  No blood clots seen. Musculoskeletal:        General: Normal range of motion.     Cervical back: Normal range of motion.  Skin:    General: Skin is warm and dry.     Capillary Refill: Capillary refill takes less than 2 seconds.  Neurological:     Mental Status: Mental status is at baseline.     GCS: GCS eye subscore is 4. GCS verbal subscore is 5. GCS motor subscore is 6.     Comments: Fluent speech, no facial droop.  Patient follows simple commands.  alert and answers questions  Left sided weakness, contracture left upper extremity, baseline per facilty  She is alert to self and location.  She thinks the year is 2020 and the month is December (current is February 2021).  Psychiatric:         Behavior: Behavior normal.     ED Results / Procedures / Treatments   Labs (all labs ordered are listed, but only abnormal results are displayed) Labs Reviewed  URINALYSIS, ROUTINE W REFLEX MICROSCOPIC - Abnormal; Notable for the following components:      Result Value   Color, Urine RED (*)    APPearance TURBID (*)    Glucose, UA   (*)    Value: TEST NOT REPORTED DUE TO COLOR INTERFERENCE OF URINE PIGMENT   Hgb urine dipstick   (*)    Value: TEST NOT REPORTED DUE TO COLOR INTERFERENCE OF URINE PIGMENT   Bilirubin Urine   (*)    Value: TEST NOT REPORTED DUE TO COLOR INTERFERENCE OF URINE PIGMENT   Ketones, ur   (*)    Value: TEST NOT REPORTED DUE TO COLOR INTERFERENCE OF URINE PIGMENT   Protein, ur   (*)    Value: TEST NOT REPORTED DUE TO COLOR INTERFERENCE OF URINE PIGMENT   Nitrite   (*)    Value: TEST NOT REPORTED DUE TO COLOR INTERFERENCE OF URINE PIGMENT   Leukocytes,Ua   (*)    Value: TEST NOT REPORTED DUE TO COLOR INTERFERENCE OF URINE PIGMENT   RBC / HPF >50 (*)    WBC, UA >50 (*)    Bacteria, UA MANY (*)    All other components within normal limits  BASIC METABOLIC PANEL - Abnormal; Notable for the following components:   Glucose, Bld 131 (*)    BUN 71 (*)    Creatinine, Ser 1.77 (*)    GFR calc non Af Amer 26 (*)    GFR calc Af Amer 30 (*)    All other components within normal limits  CBC WITH DIFFERENTIAL/PLATELET - Abnormal; Notable for the following components:  RBC 3.51 (*)    Hemoglobin 10.9 (*)    HCT 34.7 (*)    Abs Immature Granulocytes 0.10 (*)    All other components within normal limits  URINE CULTURE  SARS CORONAVIRUS 2 (TAT 6-24 HRS)    EKG None  Radiology CT Renal Stone Study  Result Date: 10/10/2019 CLINICAL DATA:  Hematuria. Passing blood clots in Foley bag. Nonambulatory. EXAM: CT ABDOMEN AND PELVIS WITHOUT CONTRAST TECHNIQUE: Multidetector CT imaging of the abdomen and pelvis was performed following the standard protocol without IV  contrast. COMPARISON:  None. FINDINGS: Lower chest: Trace bilateral pleural effusions with mild dependent atelectasis at both lung bases. The heart is mildly enlarged. There is no significant pericardial fluid. Atherosclerosis of the aorta and coronary arteries noted. Hepatobiliary: Small cysts in the left hepatic lobe. No suspicious hepatic findings on noncontrast imaging. The gallbladder is mildly distended with possible dependent sludge. No evidence of gallbladder wall thickening, surrounding inflammation or significant biliary dilatation. Pancreas: Diffuse atrophy and fatty replacement. No evidence of pancreatic ductal dilatation or surrounding inflammation. Spleen: Normal in size without focal abnormality. Adrenals/Urinary Tract: Both adrenal glands appear normal. There are calculi in the renal pelves bilaterally. There is a discrete calculus in the right renal pelvis measuring up to 7 mm on image 34/2. There are less well-defined calcifications within the left renal pelvis and infundibula. There is no ureteral calculus, hydronephrosis or perinephric soft tissue stranding. A 6.6 cm cyst is present in the upper pole of the right kidney. There is a possible smaller cyst in the upper pole of the left kidney. The bladder is decompressed by Foley catheter. A small right-sided bladder calculus is noted (image 80/2). Stomach/Bowel: No evidence of bowel wall thickening, distention or surrounding inflammatory change. The appendix appears normal. Mild diverticular changes within the distal colon. Vascular/Lymphatic: There are no enlarged abdominal or pelvic lymph nodes. Diffuse aortic and branch vessel atherosclerosis. No acute vascular findings on noncontrast imaging. Reproductive: Hysterectomy. Well-circumscribed right adnexal mass measures near water density and 7.5 x 5.2 cm on image 68/2. No suspicious left adnexal findings. Other: No evidence of abdominal wall mass or hernia. No ascites. Musculoskeletal: No acute or  significant osseous findings. There are degenerative changes throughout the spine associated with a convex left thoracolumbar scoliosis. Previous proximal left femoral ORIF. IMPRESSION: 1. 7 mm calculus in the right renal pelvis. There are less well-defined calcifications within the left renal pelvis and infundibula. No evidence of ureteral calculus, hydronephrosis or perinephric soft tissue stranding. 2. Small bladder calculus. 3. Well-circumscribed right adnexal mass measuring up to 7.5 cm in diameter is likely cystic but incompletely evaluated by this noncontrast study. This could be further evaluated with pelvic ultrasound. 4. Trace bilateral pleural effusions with mild dependent atelectasis at both lung bases. Aortic Atherosclerosis (ICD10-I70.0). Electronically Signed   By: Richardean Sale M.D.   On: 10/10/2019 19:07    Procedures Procedures (including critical care time)  Medications Ordered in ED Medications  sodium chloride 0.9 % bolus 500 mL (500 mLs Intravenous New Bag/Given (Non-Interop) 10/10/19 1951)    ED Course  I have reviewed the triage vital signs and the nursing notes.  Pertinent labs & imaging results that were available during my care of the patient were reviewed by me and considered in my medical decision making (see chart for details).  MDM Rules/Calculators/A&P                      Patient seen and examined. Patient  presents awake, at mental status baseline, hemodynamically stable, afebrile, non toxic.  On my exam she is alert and oriented to self and location.  She is able to tell me she is here in the hospital.  Her only complaint is back pain which she states is chronic.  No abdominal tenderness, no peritoneal signs.  Foley in place with grossly bloody urine in bag.  UA is red and turbid, unable to perform testing sides RBC and WBCs which are both over 50.Marland Kitchen  Urine culture sent.  Previous urine culture from 09/18/2019 shows she is only sensitive to gentamicin, imipenem,  Macrobid, pip/tazo.  CBC with no leukocytosis.  Hemoglobin is 10.9, previous was 11.8 x10 days ago.  BMP with elevated BUN/creatinine at 71 1.77 while previous was normal 17/0.69 x10 days ago. Small fluid bolus given. Last echo 09/21/2019 shows EF 55-65%.  CT renal shows stone in the right renal pelvis and in bladder, no signs of hydronephrosis or ureteral calculus. Patient will benefit from admission for hydration given AKI and treatment for UTI. Findings and plan of care discussed with supervising physician Dr. Zenia Resides. Spoke with Dr. Jonelle Sidle with hospitalist service who agrees to assume care of patient and bring into the hospital for further evaluation and management. I did not start antibiotics here in the ED given her recent culture results with very limited choices and now AKI.  Hospitalist will look into this further. Appreciate admission.   Portions of this note were generated with [employer]. Dictation errors may occur despite best attempts at proofreading.   Final Clinical Impression(s) / ED Diagnoses Final diagnoses:  AKI (acute kidney injury) ([place])  Urinary tract infection with hematuria, site unspecified    Rx / DC Orders ED Discharge Orders    None       Flint Melter 10/10/19 1955    Lacretia Leigh, MD 10/10/19 2237

## 2019-10-10 NOTE — H&P (Signed)
History and Physical   Victoria Walsh [id number] DOB: 08-09-1932 DOA: 10/10/2019  Referring MD/NP/PA: Dr. Vivi Martens  PCP: Reymundo Poll, MD   Outpatient Specialists: None  Patient coming from: [facility]  Chief Complaint: Hematuria  HPI: Victoria Walsh is a 86 y.o. female with medical history significant of dementia, CVA with residual left-sided weakness, atrial fibrillation, recurrent UTI, chronic kidney disease, hyperlipidemia who was brought in from facility where she resides in the memory unit secondary to gross hematuria noted in her Foley bag.  Patient is not a good historian.  She was however found to be weak and febrile.  Hematuria was noted with urinalysis consistent with urinary tract infection patient was recently treated with Macrobid for her UTI.  She has Significant allergies to most antibiotics.  She has completed the initial treatment and does not have any fever or chills.  Patient is on chronic anticoagulation with Eliquis.  She is also had recent infection with ESBL E. coli.  She typically communicates but they noted decrease in her responsiveness.  No fever no nausea vomiting or diarrhea.  Hemoglobin appears to still be stable.  Patient being admitted to the hospital for further work-up and treatment...  ED Course: Temperature 97.9 blood pressure 118/53 pulse 79 respirate of 17 oxygen sat 100% on room air.  Chemistry showed glucose 131 creatinine 1.77 and BUN of 71.  Previous BUN was 17 with creatinine 0.69 10 days ago.  White count is 10.2 hemoglobin 10.9 and platelets 330.  Urinalysis showed turbid urine positive nitrite many bacteria WBC more than 50.  Urine cultures obtained blood culture obtained and patient initiated on antibiotics and being admitted to the hospital for treatment  Review of Systems: As per HPI otherwise 10 point review of systems negative.    Past Medical History:  Diagnosis Date  . Chronic kidney disease   . CVA (cerebral infarction)   . Dementia  ([place])   . Hyperlipidemia   . Osteopenia   . Stroke ([place])   . UTI (lower urinary tract infection)     Past Surgical History:  Procedure Laterality Date  . ABDOMINAL HYSTERECTOMY    . BREAST LUMPECTOMY    . REPLACEMENT TOTAL KNEE    . TOTAL HIP ARTHROPLASTY       reports that she has never smoked. She has never used smokeless tobacco. She reports current alcohol use. She reports that she does not use drugs.  Allergies  Allergen Reactions  . Ciprofloxacin Other (See Comments)    On MAR  . Oxaprozin Other (See Comments)    DAYPRO (SULFA DRUG) On MAR  . Sulfa Antibiotics Other (See Comments)    On MAR    History reviewed. No pertinent family history.   Prior to Admission medications   Medication Sig Start Date End Date Taking? Authorizing Provider  apixaban (ELIQUIS) 5 MG TABS tablet Take 1 tablet (5 mg total) by mouth 2 (two) times daily. 09/25/19 11/24/19  Kayleen Memos, DO  atorvastatin (LIPITOR) 20 MG tablet Take 1 tablet (20 mg total) by mouth daily at 6 PM. 09/25/19 10/25/19  Kayleen Memos, DO  brimonidine-timolol (COMBIGAN) 0.2-0.5 % ophthalmic solution Place 1 drop into the left eye every 12 (twelve) hours.    [provider]  brinzolamide (AZOPT) 1 % ophthalmic suspension Place 1 drop into the left eye 2 (two) times daily.    [provider]  camphor-menthol Timoteo Ace) lotion Apply 1 application topically every 8 (eight) hours as needed for itching. Apply  to left ear topically for itching unsupervised self administration may keep in room and self applyprn    [provider]  Cranberry 450 MG CAPS Take 450 mg by mouth 2 (two) times daily.    [provider]  diltiazem (CARDIZEM CD) 180 MG 24 hr capsule Take 1 capsule (180 mg total) by mouth daily. 09/26/19 10/26/19  Kayleen Memos, DO  docusate sodium (COLACE) 100 MG capsule Take 100 mg by mouth daily.    [provider]  DULoxetine (CYMBALTA) 20 MG capsule Take 20 mg by mouth at bedtime.      [provider]  latanoprost (XALATAN) 0.005 % ophthalmic solution Place 1 drop into the left eye at bedtime.    [provider]  letrozole (FEMARA) 2.5 MG tablet Take 2.5 mg by mouth daily.    [provider]  levothyroxine (SYNTHROID, LEVOTHROID) 100 MCG tablet Take 100 mcg by mouth daily before breakfast.     [provider]  Melatonin 3 MG TABS Take 3 mg by mouth at bedtime.    [provider]  Menthol-Zinc Oxide (CALMOSEPTINE) 0.44-20.6 % OINT Apply 1 application topically 2 (two) times daily. Apply to buttocks for rednedd    [provider]  metoprolol tartrate (LOPRESSOR) 25 MG tablet Take 0.5 tablets (12.5 mg total) by mouth 2 (two) times daily. 09/25/19 10/25/19  Kayleen Memos, DO  Multiple Vitamins-Iron (MULTIVITAMINS WITH IRON) TABS tablet Take 1 tablet by mouth at bedtime.    [provider]  nitrofurantoin, macrocrystal-monohydrate, (MACROBID) 100 MG capsule Take 1 capsule (100 mg total) by mouth 2 (two) times daily. 09/30/19   Dorie Rank, MD  pantoprazole (PROTONIX) 40 MG tablet Take 1 tablet (40 mg total) by mouth daily. 09/26/19 10/26/19  Kayleen Memos, DO  pregabalin (LYRICA) 25 MG capsule Take 25 mg by mouth at bedtime.    [provider]  Psyllium 500 MG CAPS Take 500 mg by mouth at bedtime.     [provider]  senna (SENOKOT) 8.6 MG TABS tablet Take 1 tablet by mouth See admin instructions. Monday, Wednesday and Friday in the evening    [provider]    Physical Exam: Vitals:   10/10/19 1930 10/10/19 2000 10/10/19 2030 10/10/19 2151  BP: (!) 108/57 (!) 105/59 101/71 110/69  Pulse: 78 73 79 67  Resp: 16 13 17 16   Temp:    97.9 F (36.6 C)  TempSrc:    Oral  SpO2: 100% 100% 100% 100%      Constitutional: Pleasantly confused no acute distress Vitals:   10/10/19 1930 10/10/19 2000 10/10/19 2030 10/10/19 2151  BP: (!) 108/57 (!) 105/59 101/71 110/69  Pulse: 78 73 79 67  Resp:  16 13 17 16   Temp:    97.9 F (36.6 C)  TempSrc:    Oral  SpO2: 100% 100% 100% 100%   Eyes: PERRL, lids and conjunctivae normal ENMT: Mucous membranes are dry. Posterior pharynx clear of any exudate or lesions.Normal dentition.  Neck: normal, supple, no masses, no thyromegaly Respiratory: clear to auscultation bilaterally, no wheezing, no crackles. Normal respiratory effort. No accessory muscle use.  Cardiovascular: Regular rate and rhythm, no murmurs / rubs / gallops. No extremity edema. 2+ pedal pulses. No carotid bruits.  Abdomen: no tenderness, no masses palpated. No hepatosplenomegaly. Bowel sounds positive.  Foley and urine bag with gross hematuria Musculoskeletal: no clubbing / cyanosis. No joint deformity upper and lower extremities. Good ROM, no contractures. Normal muscle tone.  Skin: no rashes, lesions, ulcers. No induration Neurologic: CN 2-12 grossly intact. Sensation intact, DTR normal. Strength 5/5 in all 4.  Psychiatric: Confused, no distress    Labs on Admission: I have personally reviewed following labs and imaging studies  CBC: Recent Labs  Lab 10/10/19 1700  WBC 10.2  NEUTROABS 7.0  HGB 10.9*  HCT 34.7*  MCV 98.9  PLT [id number]   Basic Metabolic Panel: Recent Labs  Lab 10/10/19 1700  NA 141  K 4.1  CL 105  CO2 25  GLUCOSE 131*  BUN 71*  CREATININE 1.77*  CALCIUM 9.0   GFR: Estimated Creatinine Clearance: 26.2 mL/min (A) (by C-G formula based on SCr of 1.77 mg/dL (H)). Liver Function Tests: No results for input(s): AST, ALT, ALKPHOS, BILITOT, PROT, ALBUMIN in the last 168 hours. No results for input(s): LIPASE, AMYLASE in the last 168 hours. No results for input(s): AMMONIA in the last 168 hours. Coagulation Profile: No results for input(s): INR, PROTIME in the last 168 hours. Cardiac Enzymes: No results for input(s): CKTOTAL, CKMB, CKMBINDEX, TROPONINI in the last 168 hours. BNP (last 3 results) No results for input(s): PROBNP in the last 8760  hours. HbA1C: No results for input(s): HGBA1C in the last 72 hours. CBG: No results for input(s): GLUCAP in the last 168 hours. Lipid Profile: No results for input(s): CHOL, HDL, LDLCALC, TRIG, CHOLHDL, LDLDIRECT in the last 72 hours. Thyroid Function Tests: No results for input(s): TSH, T4TOTAL, FREET4, T3FREE, THYROIDAB in the last 72 hours. Anemia Panel: No results for input(s): VITAMINB12, FOLATE, FERRITIN, TIBC, IRON, RETICCTPCT in the last 72 hours. Urine analysis:    Component Value Date/Time   COLORURINE RED (A) 10/10/2019 1626   APPEARANCEUR TURBID (A) 10/10/2019 1626   LABSPEC  10/10/2019 1626    TEST NOT REPORTED DUE TO COLOR INTERFERENCE OF URINE PIGMENT   PHURINE  10/10/2019 1626    TEST NOT REPORTED DUE TO COLOR INTERFERENCE OF URINE PIGMENT   GLUCOSEU (A) 10/10/2019 1626    TEST NOT REPORTED DUE TO COLOR INTERFERENCE OF URINE PIGMENT   HGBUR (A) 10/10/2019 1626    TEST NOT REPORTED DUE TO COLOR INTERFERENCE OF URINE PIGMENT   BILIRUBINUR (A) 10/10/2019 1626    TEST NOT REPORTED DUE TO COLOR INTERFERENCE OF URINE PIGMENT   KETONESUR (A) 10/10/2019 1626    TEST NOT REPORTED DUE TO COLOR INTERFERENCE OF URINE PIGMENT   PROTEINUR (A) 10/10/2019 1626    TEST NOT REPORTED DUE TO COLOR INTERFERENCE OF URINE PIGMENT   NITRITE (A) 10/10/2019 1626    TEST NOT REPORTED DUE TO COLOR INTERFERENCE OF URINE PIGMENT   LEUKOCYTESUR (A) 10/10/2019 1626    TEST NOT REPORTED DUE TO COLOR INTERFERENCE OF URINE PIGMENT   Sepsis Labs: @LABRCNTIP (procalcitonin:4,lacticidven:4) )No results found for this or any previous visit (from the past 240 hour(s)).   Radiological Exams on Admission: CT Renal Stone Study  Result Date: 10/10/2019 CLINICAL DATA:  Hematuria. Passing blood clots in Foley bag. Nonambulatory. EXAM: CT ABDOMEN AND PELVIS WITHOUT CONTRAST TECHNIQUE: Multidetector CT imaging of the abdomen and pelvis was performed following the standard protocol without IV contrast.  COMPARISON:  None. FINDINGS: Lower chest: Trace bilateral pleural effusions with mild dependent atelectasis at both lung bases. The heart is mildly enlarged. There is no significant pericardial fluid. Atherosclerosis of the aorta and coronary arteries noted. Hepatobiliary: Small cysts in the left hepatic lobe. No suspicious hepatic findings on noncontrast imaging. The gallbladder is mildly distended with possible dependent sludge.  No evidence of gallbladder wall thickening, surrounding inflammation or significant biliary dilatation. Pancreas: Diffuse atrophy and fatty replacement. No evidence of pancreatic ductal dilatation or surrounding inflammation. Spleen: Normal in size without focal abnormality. Adrenals/Urinary Tract: Both adrenal glands appear normal. There are calculi in the renal pelves bilaterally. There is a discrete calculus in the right renal pelvis measuring up to 7 mm on image 34/2. There are less well-defined calcifications within the left renal pelvis and infundibula. There is no ureteral calculus, hydronephrosis or perinephric soft tissue stranding. A 6.6 cm cyst is present in the upper pole of the right kidney. There is a possible smaller cyst in the upper pole of the left kidney. The bladder is decompressed by Foley catheter. A small right-sided bladder calculus is noted (image 80/2). Stomach/Bowel: No evidence of bowel wall thickening, distention or surrounding inflammatory change. The appendix appears normal. Mild diverticular changes within the distal colon. Vascular/Lymphatic: There are no enlarged abdominal or pelvic lymph nodes. Diffuse aortic and branch vessel atherosclerosis. No acute vascular findings on noncontrast imaging. Reproductive: Hysterectomy. Well-circumscribed right adnexal mass measures near water density and 7.5 x 5.2 cm on image 68/2. No suspicious left adnexal findings. Other: No evidence of abdominal wall mass or hernia. No ascites. Musculoskeletal: No acute or  significant osseous findings. There are degenerative changes throughout the spine associated with a convex left thoracolumbar scoliosis. Previous proximal left femoral ORIF. IMPRESSION: 1. 7 mm calculus in the right renal pelvis. There are less well-defined calcifications within the left renal pelvis and infundibula. No evidence of ureteral calculus, hydronephrosis or perinephric soft tissue stranding. 2. Small bladder calculus. 3. Well-circumscribed right adnexal mass measuring up to 7.5 cm in diameter is likely cystic but incompletely evaluated by this noncontrast study. This could be further evaluated with pelvic ultrasound. 4. Trace bilateral pleural effusions with mild dependent atelectasis at both lung bases. Aortic Atherosclerosis (ICD10-I70.0). Electronically Signed   By: Richardean Sale M.D.   On: 10/10/2019 19:07    Assessment/Plan Principal Problem:   AKI (acute kidney injury) ([place]) Active Problems:   Acute lower UTI   Dementia with behavioral disturbance (H[facility])   Hypothyroidism   Chronic diastolic CHF (congestive heart failure) (H[facility])   Essential hypertension   PAF (paroxysmal atrial fibrillation) ([place])     #1 acute kidney injury: Most likely prerenal due to dehydration.  Patient has multiple calculus on her CT scan.  No evidence of ureteral calculus however or hydronephrosis.  We will hydrate the patient and monitor renal function.  #2 UTI: Patient will be placed on IV meropenem based on previous sensitivities.  Monitor closely  #3 dementia: Patient at baseline.  Continue monitor  #4 paroxysmal atrial fibrillation: Patient still in A. fib.  Stable however.  Continue home regimen including Eliquis which is held briefly  #5 chronic diastolic CHF: Appears compensated.  Continue monitor  #6 essential hypertension: Continue blood pressure monitoring and control.  #7 hypothyroidism: Continue levothyroxine    DVT prophylaxis: SCD Code Status: Full code Family Communication: No  family readily available Disposition Plan: Back to skilled facility Consults called: None Admission status: Inpatient  Severity of Illness: The appropriate patient status for this patient is INPATIENT. Inpatient status is judged to be reasonable and necessary in order to provide the required intensity of service to ensure the patient's safety. The patient's presenting symptoms, physical exam findings, and initial radiographic and laboratory data in the context of their chronic comorbidities is felt to place them at high risk for further clinical  deterioration. Furthermore, it is not anticipated that the patient will be medically stable for discharge from the hospital within 2 midnights of admission. The following factors support the patient status of inpatient.   " The patient's presenting symptoms include gross hematuria. " The worrisome physical exam findings include confusion. " The initial radiographic and laboratory data are worrisome because of urinalysis consistent with UTI. " The chronic co-morbidities include dementia.   * I certify that at the point of admission it is my clinical judgment that the patient will require inpatient hospital care spanning beyond 2 midnights from the point of admission due to high intensity of service, high risk for further deterioration and high frequency of surveillance required.Barbette Merino MD [facility] Pager [phone]  If 7PM-7AM, please contact night-coverage [email or web address] Password [facility]  10/10/2019, 11:26 PM

## 2019-10-10 NOTE — ED Triage Notes (Signed)
86 yo female BIB [facility] from [place] care facility. Pt is baseline alert to self. Facility called EMS for blood in pts urine. Facility states that pt is passing clots in her foley bag. Pt is non ambulatory due to residual left side deficits from previous CVA. Pt is full code  Vitals: bp 102/58 Hr 90 rr 18 cbg 159

## 2019-10-11 DIAGNOSIS — I10 Essential (primary) hypertension: Secondary | ICD-10-CM

## 2019-10-11 DIAGNOSIS — N390 Urinary tract infection, site not specified: Secondary | ICD-10-CM

## 2019-10-11 DIAGNOSIS — E039 Hypothyroidism, unspecified: Secondary | ICD-10-CM

## 2019-10-11 DIAGNOSIS — I480 Paroxysmal atrial fibrillation: Secondary | ICD-10-CM

## 2019-10-11 DIAGNOSIS — I5032 Chronic diastolic (congestive) heart failure: Secondary | ICD-10-CM

## 2019-10-11 DIAGNOSIS — F0391 Unspecified dementia with behavioral disturbance: Secondary | ICD-10-CM

## 2019-10-11 LAB — BASIC METABOLIC PANEL
Anion gap: 10 (ref 5–15)
BUN: 55 mg/dL — ABNORMAL HIGH (ref 8–23)
CO2: 22 mmol/L (ref 22–32)
Calcium: 8.3 mg/dL — ABNORMAL LOW (ref 8.9–10.3)
Chloride: 111 mmol/L (ref 98–111)
Creatinine, Ser: 1.3 mg/dL — ABNORMAL HIGH (ref 0.44–1.00)
GFR calc Af Amer: 43 mL/min — ABNORMAL LOW (ref >60)
GFR calc non Af Amer: 37 mL/min — ABNORMAL LOW (ref >60)
Glucose, Bld: 107 mg/dL — ABNORMAL HIGH (ref 70–99)
Potassium: 3.8 mmol/L (ref 3.5–5.1)
Sodium: 143 mmol/L (ref 135–145)

## 2019-10-11 LAB — CBC
HCT: 32.9 % — ABNORMAL LOW (ref 36.0–46.0)
Hemoglobin: 10.3 g/dL — ABNORMAL LOW (ref 12.0–15.0)
MCH: 30.5 pg (ref 26.0–34.0)
MCHC: 31.3 g/dL (ref 30.0–36.0)
MCV: 97.3 fL (ref 80.0–100.0)
Platelets: 249 10*3/uL (ref 150–400)
RBC: 3.38 MIL/uL — ABNORMAL LOW (ref 3.87–5.11)
RDW: 13.2 % (ref 11.5–15.5)
WBC: 9.3 10*3/uL (ref 4.0–10.5)
nRBC: 0 % (ref 0.0–0.2)

## 2019-10-11 LAB — SARS CORONAVIRUS 2 (TAT 6-24 HRS): SARS Coronavirus 2: NEGATIVE

## 2019-10-11 MED ORDER — CHLORHEXIDINE GLUCONATE CLOTH 2 % EX PADS
6.0000 | MEDICATED_PAD | Freq: Every day | CUTANEOUS | Status: DC
  Administered 2019-10-11 – 2019-10-13 (×3): 6 via TOPICAL

## 2019-10-11 NOTE — Progress Notes (Addendum)
PROGRESS NOTE  Victoria Walsh [id number] DOB: [birth year] DOA: 10/10/2019 PCP: Reymundo Poll, MD   LOS: 1 day   Brief narrative: As per HPI,  Victoria Walsh is a 86 y.o. female with medical history significant of dementia, CVA with residual left-sided weakness, atrial fibrillation, recurrent UTI, chronic kidney disease, hyperlipidemia  was brought in from the memory care unit with complaints of gross hematuria in the Foley bag. Patient was also noted to be febrile and weak.  Hematuria was noted with urinalysis consistent with urinary tract infection. Patient was recently treated with Macrobid for her UTI.  She has significant allergies to most antibiotics.  Patient is on chronic anticoagulation with Eliquis.  She is also had recent infection with ESBL E. coli.  She typically communicates but they noted decrease in her responsiveness.    ED Course: Temperature 97.9 blood pressure 118/53 pulse 79 respirate of 17 oxygen sat 100% on room air.  Chemistry showed glucose 131 creatinine 1.77 and BUN of 71.  Previous BUN was 17 with creatinine 0.69- 10 days ago.  White count was 10.2 hemoglobin 10.9 and platelets 330.  Urinalysis showed turbid urine positive nitrite many bacteria, WBC more than 50.  Urine cultures were obtained blood culture obtained and patient initiated on antibiotics and was admitted to the hospital for treatment.  Assessment/Plan:  Principal Problem:   AKI (acute kidney injury) ([place]) Active Problems:   Acute lower UTI   Dementia with behavioral disturbance (HCC)   Hypothyroidism   Chronic diastolic CHF (congestive heart failure) (HCC)   Essential hypertension   PAF (paroxysmal atrial fibrillation) (HCC)  Acute metabolic encephalopathy secondary to UTI on the background of dementia, patient is more alert awake and communicative.   Acute kidney injury:  On presentation likely secondary to volume depletion.  CT scan shows multiple calculus but no obstruction or hydronephrosis.   Continue IV fluid hydration.  Check renal function test closely.  Creatinine of 1.3 today from 1.7 yesterday.  Mild improvement noted.  Will decrease IV fluids today.  Catheter associated UTI with hematuria. Present on admission.  On IV meropenem based on previous sensitivities.    Follow urine cultures. On indwelling foley catheter for several years due to neurogenic bladder.  Patient is afebrile without leukocytosis.  If the cultures are negative we will consider de-escalation of the antibiotic.  I spoke with the patient's son regarding the risk of continuation of antibiotic.  Dementia: Patient is at the memory care unit.  Likely at baseline at this time.  History of paroxysmal atrial fibrillation:  Rate controlled.  Eliquis is on hold due to hematuria.  Will restart settles.   Chronic diastolic CHF:  Compensated at this time.  Will closely monitor for volume overload.  Essential hypertension:  On metoprolol.  Will continue.  hypothyroidism: Continue levothyroxine  History of constipation.  On multiple bowel regimen.  Debility, deconditioning. Wheel chair bound.  VTE Prophylaxis:  SCD  Code Status:  DNR/I.  Family Communication:  I spoke with the patients' son and updated her about the clinical condition of the patient.  I spoke with the patient's son regarding the goals of care and resuscitation status.  Patient's son stated that he would not like his mother to undergo CPR or artificial ventilation and expressed DNR.   Disposition Plan:  . Patient is from [facility] memory care unit . Likely disposition to memory care unit in 2 to 3 days . Barriers to discharge: Pending clinical improvement, pending urine culture, acute kidney injury  on IV fluids  Consultants:  None  Procedures:  None  Antibiotics:  . Meropenem iv  Anti-infectives (From admission, onward)   Start     Dose/Rate Route Frequency Ordered Stop   10/11/19 0000  meropenem (MERREM) 1 g in sodium  chloride 0.9 % 100 mL IVPB     1 g 200 mL/hr over 30 Minutes Intravenous 2 times daily 10/10/19 2333       Subjective: Today, patient was seen and examined at bedside.  Patient is a poor historian but denies overt nausea, vomiting or abdominal pain.  Objective: Vitals:   10/10/19 2030 10/10/19 2151  BP: 101/71 110/69  Pulse: 79 67  Resp: 17 16  Temp:  97.9 F (36.6 C)  SpO2: 100% 100%    Intake/Output Summary (Last 24 hours) at 10/11/2019 0948 Last data filed at 10/11/2019 0400 Gross per 24 hour  Intake 1572.08 ml  Output 300 ml  Net 1272.08 ml   Filed Weights   10/11/19 0500  Weight: 80.9 kg   Body mass index is 27.12 kg/m.   Physical Exam: GENERAL: Patient is alert awake and oriented to self.  Not in obvious distress. HENT: No scleral pallor or icterus. Pupils equally reactive to light. Oral mucosa is dry NECK: is supple, no gross swelling noted. CHEST: Clear to auscultation. No crackles or wheezes.  Diminished breath sounds bilaterally. CVS: S1 and S2 heard, no murmur.  Irregular rhythm ABDOMEN: Soft, non-tender, bowel sounds are present.  Foley catheter in place EXTREMITIES: No edema. CNS: Cranial nerves are intact.  Moving all extremities, left sided weakness SKIN: warm and dry without rashes.  Data Review: I have personally reviewed the following laboratory data and studies,  CBC: Recent Labs  Lab 10/10/19 1700 10/11/19 0503  WBC 10.2 9.3  NEUTROABS 7.0  --   HGB 10.9* 10.3*  HCT 34.7* 32.9*  MCV 98.9 97.3  PLT 330 0000000   Basic Metabolic Panel: Recent Labs  Lab 10/10/19 1700 10/11/19 0739  NA 141 143  K 4.1 3.8  CL 105 111  CO2 25 22  GLUCOSE 131* 107*  BUN 71* 55*  CREATININE 1.77* 1.30*  CALCIUM 9.0 8.3*   Liver Function Tests: No results for input(s): AST, ALT, ALKPHOS, BILITOT, PROT, ALBUMIN in the last 168 hours. No results for input(s): LIPASE, AMYLASE in the last 168 hours. No results for input(s): AMMONIA in the last 168  hours. Cardiac Enzymes: No results for input(s): CKTOTAL, CKMB, CKMBINDEX, TROPONINI in the last 168 hours. BNP (last 3 results) No results for input(s): BNP in the last 8760 hours.  ProBNP (last 3 results) No results for input(s): PROBNP in the last 8760 hours.  CBG: No results for input(s): GLUCAP in the last 168 hours. Recent Results (from the past 240 hour(s))  SARS CORONAVIRUS 2 (TAT 6-24 HRS) Nasopharyngeal Nasopharyngeal Swab     Status: None   Collection Time: 10/10/19  9:17 PM   Specimen: Nasopharyngeal Swab  Result Value Ref Range Status   SARS Coronavirus 2 NEGATIVE NEGATIVE Final    Comment: (NOTE) SARS-CoV-2 target nucleic acids are NOT DETECTED. The SARS-CoV-2 RNA is generally detectable in upper and lower respiratory specimens during the acute phase of infection. Negative results do not preclude SARS-CoV-2 infection, do not rule out co-infections with other pathogens, and should not be used as the sole basis for treatment or other patient management decisions. Negative results must be combined with clinical observations, patient history, and epidemiological information. The expected result is Negative.  Fact Sheet for Patients: SugarRoll.be Fact Sheet for Healthcare Providers: https://www.woods-mathews.com/ This test is not yet approved or cleared by the [place] FDA and  has been authorized for detection and/or diagnosis of SARS-CoV-2 by FDA under an Emergency Use Authorization (EUA). This EUA will remain  in effect (meaning this test can be used) for the duration of the COVID-19 declaration under Section 56 4(b)(1) of the Act, 21 U.S.C. section 360bbb-3(b)(1), unless the authorization is terminated or revoked sooner. Performed at [facility], [place] [address] [place], [place] [address]      Studies: CT Renal Stone Study  Result Date: 10/10/2019 CLINICAL DATA:  Hematuria. Passing blood clots in Foley bag.  Nonambulatory. EXAM: CT ABDOMEN AND PELVIS WITHOUT CONTRAST TECHNIQUE: Multidetector CT imaging of the abdomen and pelvis was performed following the standard protocol without IV contrast. COMPARISON:  None. FINDINGS: Lower chest: Trace bilateral pleural effusions with mild dependent atelectasis at both lung bases. The heart is mildly enlarged. There is no significant pericardial fluid. Atherosclerosis of the aorta and coronary arteries noted. Hepatobiliary: Small cysts in the left hepatic lobe. No suspicious hepatic findings on noncontrast imaging. The gallbladder is mildly distended with possible dependent sludge. No evidence of gallbladder wall thickening, surrounding inflammation or significant biliary dilatation. Pancreas: Diffuse atrophy and fatty replacement. No evidence of pancreatic ductal dilatation or surrounding inflammation. Spleen: Normal in size without focal abnormality. Adrenals/Urinary Tract: Both adrenal glands appear normal. There are calculi in the renal pelves bilaterally. There is a discrete calculus in the right renal pelvis measuring up to 7 mm on image 34/2. There are less well-defined calcifications within the left renal pelvis and infundibula. There is no ureteral calculus, hydronephrosis or perinephric soft tissue stranding. A 6.6 cm cyst is present in the upper pole of the right kidney. There is a possible smaller cyst in the upper pole of the left kidney. The bladder is decompressed by Foley catheter. A small right-sided bladder calculus is noted (image 80/2). Stomach/Bowel: No evidence of bowel wall thickening, distention or surrounding inflammatory change. The appendix appears normal. Mild diverticular changes within the distal colon. Vascular/Lymphatic: There are no enlarged abdominal or pelvic lymph nodes. Diffuse aortic and branch vessel atherosclerosis. No acute vascular findings on noncontrast imaging. Reproductive: Hysterectomy. Well-circumscribed right adnexal mass measures  near water density and 7.5 x 5.2 cm on image 68/2. No suspicious left adnexal findings. Other: No evidence of abdominal wall mass or hernia. No ascites. Musculoskeletal: No acute or significant osseous findings. There are degenerative changes throughout the spine associated with a convex left thoracolumbar scoliosis. Previous proximal left femoral ORIF. IMPRESSION: 1. 7 mm calculus in the right renal pelvis. There are less well-defined calcifications within the left renal pelvis and infundibula. No evidence of ureteral calculus, hydronephrosis or perinephric soft tissue stranding. 2. Small bladder calculus. 3. Well-circumscribed right adnexal mass measuring up to 7.5 cm in diameter is likely cystic but incompletely evaluated by this noncontrast study. This could be further evaluated with pelvic ultrasound. 4. Trace bilateral pleural effusions with mild dependent atelectasis at both lung bases. Aortic Atherosclerosis (ICD10-I70.0). Electronically Signed   By: Richardean Sale M.D.   On: 10/10/2019 19:07    Flora Lipps, MD  [facility] 10/11/2019

## 2019-10-11 NOTE — TOC Initial Note (Signed)
Transition of Care [facility]) - Initial/Assessment Note    Patient Details  Name: Victoria Walsh MRN: [id number] Date of Birth: 1932-04-07  Transition of Care [facility]) CM/SW Contact:    Trish Mage, LCSW Phone Number: 10/11/2019, 10:56 AM  Clinical Narrative:  Plan to return back to [facility] [place]-w/c bound, f/c, L side hemiparesis. fl2 stating memory care needed at d/c-facility rep Bee following. FAX # is [phone]. Required COVID test within 72 hours of d/c. PTAR @ d/c.                 Expected Discharge Plan: Memory Care Barriers to Discharge: No Barriers Identified   Patient Goals and CMS Choice        Expected Discharge Plan and Services Expected Discharge Plan: Memory Care   Discharge Planning Services: CM Consult Post Acute Care Choice: Resumption of Svcs/PTA Provider Living arrangements for the past 2 months: [place]                                      Prior Living Arrangements/Services Living arrangements for the past 2 months: [place] Lives with:: Facility Resident Patient language and need for interpreter reviewed:: Yes Do you feel safe going back to the place where you live?: Yes      Need for Family Participation in Patient Care: No (Comment) Care giver support system in place?: Yes (comment) Current home services: DME Criminal Activity/Legal Involvement Pertinent to Current Situation/Hospitalization: No - Comment as needed  Activities of Daily Living Home Assistive Devices/Equipment: None ADL Screening (condition at time of admission) Patient's cognitive ability adequate to safely complete daily activities?: Yes Is the patient deaf or have difficulty hearing?: No Does the patient have difficulty seeing, even when wearing glasses/contacts?: No Does the patient have difficulty concentrating, remembering, or making decisions?: No Patient able to express need for assistance with ADLs?: No Does the patient have  difficulty dressing or bathing?: No Independently performs ADLs?: No Communication: Dependent Is this a change from baseline?: Pre-admission baseline Dressing (OT): Dependent Is this a change from baseline?: Pre-admission baseline Grooming: Dependent Is this a change from baseline?: Pre-admission baseline Feeding: Dependent Is this a change from baseline?: Pre-admission baseline Bathing: Dependent Is this a change from baseline?: Pre-admission baseline Toileting: Dependent Is this a change from baseline?: Pre-admission baseline In/Out Bed: Dependent Is this a change from baseline?: Pre-admission baseline Walks in Home: Dependent Is this a change from baseline?: Pre-admission baseline Does the patient have difficulty walking or climbing stairs?: Yes Weakness of Legs: Both Weakness of Arms/Hands: Both  Permission Sought/Granted Permission sought to share information with : Family Supports Permission granted to share information with : Yes, Verbal Permission Granted  Share Information with NAME: [name]     Permission granted to share info w Relationship: son  Permission granted to share info w Contact Information: [phone]  Emotional Assessment Appearance:: Appears stated age Attitude/Demeanor/Rapport: Gracious Affect (typically observed): Accepting Orientation: : Oriented to Self Alcohol / Substance Use: Not Applicable Psych Involvement: No (comment)  Admission diagnosis:  AKI (acute kidney injury) ([place]) [N17.9] Urinary tract infection with hematuria, site unspecified [N39.0, R31.9] Patient Active Problem List   Diagnosis Date Noted  . PAF (paroxysmal atrial fibrillation) ([place])   . Atrial fibrillation with RVR ([place])   . Chronic diastolic CHF (congestive heart failure) ([place])   . Coronary artery disease involving native coronary artery  of native heart without angina pectoris   . Essential hypertension   . Acute lower UTI 09/18/2019  . Sepsis ([place]) 09/18/2019  . AKI  (acute kidney injury) ([place]) 09/18/2019  . Dementia with behavioral disturbance ([place]) 09/18/2019  . Ischemic stroke ([place]) 09/18/2019  . Hypothyroidism 09/18/2019  . Breast cancer ([place]) 09/18/2019   PCP:  Reymundo Poll, MD Pharmacy:  No Pharmacies Listed    Social Determinants of Health (SDOH) Interventions    Readmission Risk Interventions Readmission Risk Prevention Plan 09/19/2019  Transportation Screening Complete  PCP or Specialist Appt within 3-5 Days Complete  HRI or [place] Complete  Social Work Consult for [place] Planning/Counseling Complete  Palliative Care Screening Complete  Medication Review Press photographer) Complete  Some recent data might be hidden

## 2019-10-12 LAB — CBC
HCT: 29.4 % — ABNORMAL LOW (ref 36.0–46.0)
Hemoglobin: 8.9 g/dL — ABNORMAL LOW (ref 12.0–15.0)
MCH: 30.6 pg (ref 26.0–34.0)
MCHC: 30.3 g/dL (ref 30.0–36.0)
MCV: 101 fL — ABNORMAL HIGH (ref 80.0–100.0)
Platelets: 245 10*3/uL (ref 150–400)
RBC: 2.91 MIL/uL — ABNORMAL LOW (ref 3.87–5.11)
RDW: 13.4 % (ref 11.5–15.5)
WBC: 9.8 10*3/uL (ref 4.0–10.5)
nRBC: 0 % (ref 0.0–0.2)

## 2019-10-12 LAB — BASIC METABOLIC PANEL
Anion gap: 9 (ref 5–15)
BUN: 41 mg/dL — ABNORMAL HIGH (ref 8–23)
CO2: 20 mmol/L — ABNORMAL LOW (ref 22–32)
Calcium: 8.1 mg/dL — ABNORMAL LOW (ref 8.9–10.3)
Chloride: 114 mmol/L — ABNORMAL HIGH (ref 98–111)
Creatinine, Ser: 1.18 mg/dL — ABNORMAL HIGH (ref 0.44–1.00)
GFR calc Af Amer: 49 mL/min — ABNORMAL LOW (ref >60)
GFR calc non Af Amer: 42 mL/min — ABNORMAL LOW (ref >60)
Glucose, Bld: 131 mg/dL — ABNORMAL HIGH (ref 70–99)
Potassium: 3.3 mmol/L — ABNORMAL LOW (ref 3.5–5.1)
Sodium: 143 mmol/L (ref 135–145)

## 2019-10-12 LAB — MAGNESIUM: Magnesium: 1.9 mg/dL (ref 1.7–2.4)

## 2019-10-12 MED ORDER — APIXABAN 5 MG PO TABS
5.0000 mg | ORAL_TABLET | Freq: Two times a day (BID) | ORAL | Status: DC
  Administered 2019-10-12 – 2019-10-13 (×2): 5 mg via ORAL
  Filled 2019-10-12 (×2): qty 1

## 2019-10-12 MED ORDER — POTASSIUM CHLORIDE CRYS ER 20 MEQ PO TBCR
40.0000 meq | EXTENDED_RELEASE_TABLET | Freq: Once | ORAL | Status: AC
  Administered 2019-10-12: 40 meq via ORAL
  Filled 2019-10-12: qty 2

## 2019-10-12 NOTE — Care Management Important Message (Signed)
Important Message  Patient Details IM Letter given to [facility] SW to present to the Patient Name: Victoria Walsh MRN: [id number] Date of Birth: 22-May-1932   Medicare Important Message Given:  Yes     Kerin Salen 10/12/2019, 10:05 AM

## 2019-10-12 NOTE — Progress Notes (Addendum)
PROGRESS NOTE  Victoria Walsh [id number] DOB: 1932/06/10 DOA: 10/10/2019 PCP: Reymundo Poll, MD   LOS: 2 days   Brief narrative: As per HPI,  Victoria Walsh is a 86 y.o. female with medical history significant of dementia, CVA with residual left-sided weakness, atrial fibrillation, recurrent UTI, chronic kidney disease, hyperlipidemia  was brought in from the memory care unit with complaints of gross hematuria in the Foley bag. Patient was also noted to be febrile and weak.  Hematuria was noted with urinalysis consistent with urinary tract infection. Patient was recently treated with Macrobid for her UTI.  She has significant allergies to most antibiotics.  Patient is on chronic anticoagulation with Eliquis.  She is also had recent infection with ESBL E. coli.  She typically communicates but they noted decrease in her responsiveness.    ED Course: Temperature 97.9 blood pressure 118/53 pulse 79 respirate of 17 oxygen sat 100% on room air.  Chemistry showed glucose 131 creatinine 1.77 and BUN of 71.  Previous BUN was 17 with creatinine 0.69- 10 days ago.  White count was 10.2 hemoglobin 10.9 and platelets 330.  Urinalysis showed turbid urine positive nitrite many bacteria, WBC more than 50.  Urine cultures were obtained blood culture obtained and patient initiated on antibiotics and was admitted to the hospital for treatment.  Assessment/Plan:  Principal Problem:   AKI (acute kidney injury) ([place]) Active Problems:   Acute lower UTI   Dementia with behavioral disturbance (HCC)   Hypothyroidism   Chronic diastolic CHF (congestive heart failure) (HCC)   Essential hypertension   PAF (paroxysmal atrial fibrillation) ([place])  Acute metabolic encephalopathy secondary to UTI on the background of dementia.  More communicative today.  Acute kidney injury on CKD IIIa:  likely secondary to volume depletion.  CT scan shows multiple calculus but no obstruction or hydronephrosis.  Patient is received IV fluids.   Improved renal function at this time.  Creatinine of 1.1 today.  Will discontinue IV fluids today.  Mild hypokalemia.  Will replenish.  Check BMP in a.m.  Catheter associated UTI with hematuria. Present on admission.  On IV meropenem based on previous sensitivities.    Urine culture showing Klebsiella pneumoniae sensitivity pending.. On indwelling foley catheter for several years due to neurogenic bladder.  Afebrile without leukocytosis.  Dementia: Patient is at the memory care unit.  Likely at baseline at this time.  History of paroxysmal atrial fibrillation:  Rate controlled.  Will restart eliquis when urine clears.   Chronic diastolic CHF:  Compensated at this time.  Will closely monitor for volume overload.  Discontinue IV fluids today.  Essential hypertension:  On metoprolol.  Blood pressure seems to be stable.  hypothyroidism: Continue levothyroxine  History of constipation.  On multiple bowel regimen.  Continue with that.  Debility, deconditioning. Wheel chair bound.  Currently at assisted living facility.  VTE Prophylaxis:  SCD  Code Status:  DNR/I.  Family Communication:  I the patient's son yesterday..   Disposition Plan:  . Patient is from [facility] memory care unit . Likely disposition to memory care unit in 1 to 2 days . Barriers to discharge: Pending clinical improvement, pending urine culture and sensitivity.  Patient is wheelchair-bound at baseline.  Consultants:  None  Procedures:  None  Antibiotics:  . Meropenem iv 2/25>  Anti-infectives (From admission, onward)   Start     Dose/Rate Route Frequency Ordered Stop   10/11/19 0000  meropenem (MERREM) 1 g in sodium chloride 0.9 % 100 mL IVPB  1 g 200 mL/hr over 30 Minutes Intravenous 2 times daily 10/10/19 2333       Subjective:  Today, patient was seen and examined at bedside.  Communicative and answering few questions.  Denies pain.  Poor historian.  Objective: Vitals:   10/11/19  2132 10/12/19 0514  BP: 124/72 112/62  Pulse: 63 (!) 59  Resp: 16 16  Temp: 98 F (36.7 C) 98.4 F (36.9 C)  SpO2: 96% 100%    Intake/Output Summary (Last 24 hours) at 10/12/2019 0950 Last data filed at 10/12/2019 0811 Gross per 24 hour  Intake 420 ml  Output 400 ml  Net 20 ml   Filed Weights   10/11/19 0500 10/12/19 0500  Weight: 80.9 kg 80.6 kg   Body mass index is 27.02 kg/m.   Physical Exam: GENERAL: Patient is alert awake and oriented to self.  Not in obvious distress.  Communicative HENT: No scleral pallor or icterus. Pupils equally reactive to light. Oral mucosa is dry NECK: is supple, no gross swelling noted. CHEST: Clear to auscultation. No crackles or wheezes.  Diminished breath sounds bilaterally. CVS: S1 and S2 heard, no murmur.  Irregular rhythm ABDOMEN: Soft, non-tender, bowel sounds are present.  Foley catheter in place EXTREMITIES: No edema. CNS: Cranial nerves are intact.  Moving all extremities, left sided weakness SKIN: warm and dry without rashes.  Data Review: I have personally reviewed the following laboratory data and studies,  CBC: Recent Labs  Lab 10/10/19 1700 10/11/19 0503 10/12/19 0454  WBC 10.2 9.3 9.8  NEUTROABS 7.0  --   --   HGB 10.9* 10.3* 8.9*  HCT 34.7* 32.9* 29.4*  MCV 98.9 97.3 101.0*  PLT 330 249 [id number]   Basic Metabolic Panel: Recent Labs  Lab 10/10/19 1700 10/11/19 0739 10/12/19 0454  NA 141 143 143  K 4.1 3.8 3.3*  CL 105 111 114*  CO2 25 22 20*  GLUCOSE 131* 107* 131*  BUN 71* 55* 41*  CREATININE 1.77* 1.30* 1.18*  CALCIUM 9.0 8.3* 8.1*  MG  --   --  1.9   Liver Function Tests: No results for input(s): AST, ALT, ALKPHOS, BILITOT, PROT, ALBUMIN in the last 168 hours. No results for input(s): LIPASE, AMYLASE in the last 168 hours. No results for input(s): AMMONIA in the last 168 hours. Cardiac Enzymes: No results for input(s): CKTOTAL, CKMB, CKMBINDEX, TROPONINI in the last 168 hours. BNP (last 3 results) No  results for input(s): BNP in the last 8760 hours.  ProBNP (last 3 results) No results for input(s): PROBNP in the last 8760 hours.  CBG: No results for input(s): GLUCAP in the last 168 hours. Recent Results (from the past 240 hour(s))  Urine Culture     Status: Abnormal (Preliminary result)   Collection Time: 10/10/19  4:26 PM   Specimen: Urine, Random  Result Value Ref Range Status   Specimen Description   Final    URINE, RANDOM Performed at [facility] [address], [place], [place] [address]    Special Requests   Final    NONE Performed at [facility], [place] [address] [place], [place] [address]    Culture (A)  Final    >=100,000 COLONIES/mL KLEBSIELLA PNEUMONIAE CULTURE REINCUBATED FOR BETTER GROWTH Performed at [facility], [place] [address], [place], [place] [address]    Report Status PENDING  Incomplete  SARS CORONAVIRUS 2 (TAT 6-24 HRS) Nasopharyngeal Nasopharyngeal Swab     Status: None   Collection  Time: 10/10/19  9:17 PM   Specimen: Nasopharyngeal Swab  Result Value Ref Range Status   SARS Coronavirus 2 NEGATIVE NEGATIVE Final    Comment: (NOTE) SARS-CoV-2 target nucleic acids are NOT DETECTED. The SARS-CoV-2 RNA is generally detectable in upper and lower respiratory specimens during the acute phase of infection. Negative results do not preclude SARS-CoV-2 infection, do not rule out co-infections with other pathogens, and should not be used as the sole basis for treatment or other patient management decisions. Negative results must be combined with clinical observations, patient history, and epidemiological information. The expected result is Negative. Fact Sheet for Patients: SugarRoll.be Fact Sheet for Healthcare Providers: https://www.woods-mathews.com/ This test is not yet approved or cleared by the [place] FDA and  has been authorized for detection and/or diagnosis  of SARS-CoV-2 by FDA under an Emergency Use Authorization (EUA). This EUA will remain  in effect (meaning this test can be used) for the duration of the COVID-19 declaration under Section 56 4(b)(1) of the Act, 21 U.S.C. section 360bbb-3(b)(1), unless the authorization is terminated or revoked sooner. Performed at [facility], [place] [address] [place],  [address]      Studies: CT Renal Stone Study  Result Date: 10/10/2019 CLINICAL DATA:  Hematuria. Passing blood clots in Foley bag. Nonambulatory. EXAM: CT ABDOMEN AND PELVIS WITHOUT CONTRAST TECHNIQUE: Multidetector CT imaging of the abdomen and pelvis was performed following the standard protocol without IV contrast. COMPARISON:  None. FINDINGS: Lower chest: Trace bilateral pleural effusions with mild dependent atelectasis at both lung bases. The heart is mildly enlarged. There is no significant pericardial fluid. Atherosclerosis of the aorta and coronary arteries noted. Hepatobiliary: Small cysts in the left hepatic lobe. No suspicious hepatic findings on noncontrast imaging. The gallbladder is mildly distended with possible dependent sludge. No evidence of gallbladder wall thickening, surrounding inflammation or significant biliary dilatation. Pancreas: Diffuse atrophy and fatty replacement. No evidence of pancreatic ductal dilatation or surrounding inflammation. Spleen: Normal in size without focal abnormality. Adrenals/Urinary Tract: Both adrenal glands appear normal. There are calculi in the renal pelves bilaterally. There is a discrete calculus in the right renal pelvis measuring up to 7 mm on image 34/2. There are less well-defined calcifications within the left renal pelvis and infundibula. There is no ureteral calculus, hydronephrosis or perinephric soft tissue stranding. A 6.6 cm cyst is present in the upper pole of the right kidney. There is a possible smaller cyst in the upper pole of the left kidney. The bladder is  decompressed by Foley catheter. A small right-sided bladder calculus is noted (image 80/2). Stomach/Bowel: No evidence of bowel wall thickening, distention or surrounding inflammatory change. The appendix appears normal. Mild diverticular changes within the distal colon. Vascular/Lymphatic: There are no enlarged abdominal or pelvic lymph nodes. Diffuse aortic and branch vessel atherosclerosis. No acute vascular findings on noncontrast imaging. Reproductive: Hysterectomy. Well-circumscribed right adnexal mass measures near water density and 7.5 x 5.2 cm on image 68/2. No suspicious left adnexal findings. Other: No evidence of abdominal wall mass or hernia. No ascites. Musculoskeletal: No acute or significant osseous findings. There are degenerative changes throughout the spine associated with a convex left thoracolumbar scoliosis. Previous proximal left femoral ORIF. IMPRESSION: 1. 7 mm calculus in the right renal pelvis. There are less well-defined calcifications within the left renal pelvis and infundibula. No evidence of ureteral calculus, hydronephrosis or perinephric soft tissue stranding. 2. Small bladder calculus. 3. Well-circumscribed right adnexal mass measuring up to 7.5 cm in diameter  is likely cystic but incompletely evaluated by this noncontrast study. This could be further evaluated with pelvic ultrasound. 4. Trace bilateral pleural effusions with mild dependent atelectasis at both lung bases. Aortic Atherosclerosis (ICD10-I70.0). Electronically Signed   By: Richardean Sale M.D.   On: 10/10/2019 19:07    Flora Lipps, MD  [facility] 10/12/2019

## 2019-10-13 LAB — BASIC METABOLIC PANEL
Anion gap: 7 (ref 5–15)
BUN: 30 mg/dL — ABNORMAL HIGH (ref 8–23)
CO2: 21 mmol/L — ABNORMAL LOW (ref 22–32)
Calcium: 8.6 mg/dL — ABNORMAL LOW (ref 8.9–10.3)
Chloride: 118 mmol/L — ABNORMAL HIGH (ref 98–111)
Creatinine, Ser: 1.08 mg/dL — ABNORMAL HIGH (ref 0.44–1.00)
GFR calc Af Amer: 54 mL/min — ABNORMAL LOW (ref >60)
GFR calc non Af Amer: 47 mL/min — ABNORMAL LOW (ref >60)
Glucose, Bld: 106 mg/dL — ABNORMAL HIGH (ref 70–99)
Potassium: 4 mmol/L (ref 3.5–5.1)
Sodium: 146 mmol/L — ABNORMAL HIGH (ref 135–145)

## 2019-10-13 LAB — URINE CULTURE: Culture: >=100000 — AB

## 2019-10-13 LAB — CBC
HCT: 30.6 % — ABNORMAL LOW (ref 36.0–46.0)
Hemoglobin: 9.3 g/dL — ABNORMAL LOW (ref 12.0–15.0)
MCH: 30.6 pg (ref 26.0–34.0)
MCHC: 30.4 g/dL (ref 30.0–36.0)
MCV: 100.7 fL — ABNORMAL HIGH (ref 80.0–100.0)
Platelets: 245 10*3/uL (ref 150–400)
RBC: 3.04 MIL/uL — ABNORMAL LOW (ref 3.87–5.11)
RDW: 13.6 % (ref 11.5–15.5)
WBC: 8.9 10*3/uL (ref 4.0–10.5)
nRBC: 0 % (ref 0.0–0.2)

## 2019-10-13 MED ORDER — SODIUM CHLORIDE 0.9 % IV SOLN
1.0000 g | INTRAVENOUS | Status: DC
  Filled 2019-10-13: qty 10

## 2019-10-13 MED ORDER — CEPHALEXIN 250 MG PO CAPS: 250.0000 mg | ORAL_CAPSULE | Freq: Four times a day (QID) | ORAL | 0 refills | Status: DC

## 2019-10-13 MED ORDER — CEPHALEXIN 250 MG PO CAPS: 250.0000 mg | ORAL_CAPSULE | Freq: Four times a day (QID) | ORAL | 0 refills | Status: AC

## 2019-10-13 NOTE — Progress Notes (Signed)
[facility] called and said pt is okay to go.  Please reconsult if future social work needs arise.  CSW signing off, as social work intervention is no longer needed.  Alphonse Guild. Majesty Stehlin, LCSW, LCAS, CSI Transitions of Care Clinical Social Worker Care Coordination Department Ph: [phone]

## 2019-10-13 NOTE — Progress Notes (Signed)
CSW received a call from [facility] stating the patient has been offered a bed and has been accepted and that the pt can arrive on 2/27.  The pt's accepting doctor is ALF MD.  The room number will be 174  The number for report is [phone] and please ask for [place].  Please send D/C summary w/ pt at D/C.  RN updated and will call report now..  CSW will update RN and EDP.  Alphonse Guild. Joury Allcorn, Latanya Presser, LCAS Clinical Social Worker Ph: [phone]

## 2019-10-13 NOTE — Progress Notes (Signed)
PROGRESS NOTE  Victoria Walsh [id number] DOB: [birth year] DOA: 10/10/2019 PCP: Reymundo Poll, MD   LOS: 3 days   Brief narrative: As per HPI,  Victoria Walsh is a 86 y.o. female with medical history significant of dementia, CVA with residual left-sided weakness, atrial fibrillation, recurrent UTI, chronic kidney disease, hyperlipidemia  was brought in from the memory care unit with complaints of gross hematuria in the Foley bag. Patient was also noted to be febrile and weak.  Hematuria was noted with urinalysis consistent with urinary tract infection. Patient was recently treated with Macrobid for her UTI.  She has significant allergies to most antibiotics.  Patient is on chronic anticoagulation with Eliquis.  She is also had recent infection with ESBL E. coli.  She typically communicates but they noted decrease in her responsiveness.    ED Course: Temperature 97.9 blood pressure 118/53 pulse 79 respirate of 17 oxygen sat 100% on room air.  Chemistry showed glucose 131 creatinine 1.77 and BUN of 71.  Previous BUN was 17 with creatinine 0.69- 10 days ago.  White count was 10.2 hemoglobin 10.9 and platelets 330.  Urinalysis showed turbid urine positive nitrite many bacteria, WBC more than 50.  Urine cultures were obtained, blood culture obtained and patient initiated on antibiotics and was admitted to the hospital for treatment.  Assessment/Plan:  Principal Problem:   AKI (acute kidney injury) ([place]) Active Problems:   Acute lower UTI   Dementia with behavioral disturbance (HCC)   Hypothyroidism   Chronic diastolic CHF (congestive heart failure) (HCC)   Essential hypertension   PAF (paroxysmal atrial fibrillation) ([place])  Acute metabolic encephalopathy secondary to UTI on the background of dementia.  More communicative today. Has baseline dementia.  Acute kidney injury on CKD IIIa:  likely secondary to volume depletion.  CT scan shows multiple calculus but no obstruction or hydronephrosis.   Patient has received IV fluids.  Improved renal function at this time.  Creatinine of 1.08 today. Off IV fluids.  Encourage oral fluids.  Mild hypokalemia.  Improved with replacement.  Catheter associated UTI with hematuria. Present on admission.  On IV meropenem based on previous sensitivities.    Urine culture showing Klebsiella pneumoniae and proteus continue Rocephin.  Will discontinue meropenem and put the patient on Rocephin. Will consider keflex on discharge. On indwelling foley catheter for several years due to neurogenic bladder.  Afebrile without leukocytosis.  Hemoglobin of 9.3.  Hemoglobin was 8.3 yesterday.  Dementia: Patient is at the memory care unit.  Likely at baseline at this time.  History of paroxysmal atrial fibrillation:  Rate controlled. on eliquis    Chronic diastolic CHF:  Compensated at this time.  Will closely monitor for volume overload.  Discontinue IV fluids today.  Essential hypertension:  On metoprolol.  Blood pressure seems to be stable.  hypothyroidism: Continue levothyroxine  History of constipation.  On multiple bowel regimen.  Continue with that.  Debility, deconditioning. Wheel chair bound.  Currently at assisted living facility.  VTE Prophylaxis:  SCD  Code Status:  DNR/I.  Family Communication:   Spoke with the patient's son and updated him about the clinical condition of the patient and the plan for transfer back to assisted living facility when bed is available.  Disposition Plan:  . Patient is from [facility] memory care unit . Likely disposition to memory care unit in 1 to 2 days . Medically stable for disposition . Barriers to discharge: Pending transfer back to memory care unit.  Consultants:  None  Procedures:  None  Antibiotics:  . Meropenem iv 2/25>2/27 . Rocephin 2/27>  Anti-infectives (From admission, onward)   Start     Dose/Rate Route Frequency Ordered Stop   10/11/19 0000  meropenem (MERREM) 1 g in sodium  chloride 0.9 % 100 mL IVPB     1 g 200 mL/hr over 30 Minutes Intravenous 2 times daily 10/10/19 2333       Subjective:  Today, patient was seen and examined at bedside.  Patient denies pain.  Poor historian.  Objective: Vitals:   10/13/19 0611 10/13/19 1302  BP: 109/69 121/65  Pulse: (!) 52 (!) 56  Resp: 18 18  Temp: 97.8 F (36.6 C) 97.9 F (36.6 C)  SpO2: 99% 100%    Intake/Output Summary (Last 24 hours) at 10/13/2019 1416 Last data filed at 10/13/2019 0000 Gross per 24 hour  Intake 120 ml  Output --  Net 120 ml   Filed Weights   10/11/19 0500 10/12/19 0500 10/13/19 0500  Weight: 80.9 kg 80.6 kg 80.8 kg   Body mass index is 27.08 kg/m.   Physical Exam: GENERAL: Patient is alert awake and oriented to self.  Not in obvious distress.  Communicative HENT: No scleral pallor or icterus. Pupils equally reactive to light. Oral mucosa is dry NECK: is supple, no gross swelling noted. CHEST: Clear to auscultation. No crackles or wheezes.  Diminished breath sounds bilaterally. CVS: S1 and S2 heard, no murmur.  Irregular rhythm ABDOMEN: Soft, non-tender, bowel sounds are present.  Foley catheter in place EXTREMITIES: No edema. CNS: Cranial nerves are intact.  Moving all extremities, left sided weakness SKIN: warm and dry without rashes.  Data Review: I have personally reviewed the following laboratory data and studies,  CBC: Recent Labs  Lab 10/10/19 1700 10/11/19 0503 10/12/19 0454 10/13/19 0539  WBC 10.2 9.3 9.8 8.9  NEUTROABS 7.0  --   --   --   HGB 10.9* 10.3* 8.9* 9.3*  HCT 34.7* 32.9* 29.4* 30.6*  MCV 98.9 97.3 101.0* 100.7*  PLT 330 249 245 [id number]   Basic Metabolic Panel: Recent Labs  Lab 10/10/19 1700 10/11/19 0739 10/12/19 0454 10/13/19 0539  NA 141 143 143 146*  K 4.1 3.8 3.3* 4.0  CL 105 111 114* 118*  CO2 25 22 20* 21*  GLUCOSE 131* 107* 131* 106*  BUN 71* 55* 41* 30*  CREATININE 1.77* 1.30* 1.18* 1.08*  CALCIUM 9.0 8.3* 8.1* 8.6*  MG  --   --   1.9  --    Liver Function Tests: No results for input(s): AST, ALT, ALKPHOS, BILITOT, PROT, ALBUMIN in the last 168 hours. No results for input(s): LIPASE, AMYLASE in the last 168 hours. No results for input(s): AMMONIA in the last 168 hours. Cardiac Enzymes: No results for input(s): CKTOTAL, CKMB, CKMBINDEX, TROPONINI in the last 168 hours. BNP (last 3 results) No results for input(s): BNP in the last 8760 hours.  ProBNP (last 3 results) No results for input(s): PROBNP in the last 8760 hours.  CBG: No results for input(s): GLUCAP in the last 168 hours. Recent Results (from the past 240 hour(s))  Urine Culture     Status: Abnormal   Collection Time: 10/10/19  4:26 PM   Specimen: Urine, Random  Result Value Ref Range Status   Specimen Description   Final    URINE, RANDOM Performed at [facility] [address], [place], [place] [address]    Special Requests   Final    NONE Performed  at [facility], [place] [address] [place], [place] [address]    Culture (A)  Final    >=100,000 COLONIES/mL KLEBSIELLA PNEUMONIAE >=100,000 COLONIES/mL PROTEUS MIRABILIS    Report Status 10/13/2019 FINAL  Final   Organism ID, Bacteria KLEBSIELLA PNEUMONIAE (A)  Final   Organism ID, Bacteria PROTEUS MIRABILIS (A)  Final      Susceptibility   Klebsiella pneumoniae - MIC*    AMPICILLIN >=32 RESISTANT Resistant     CEFAZOLIN <=4 SENSITIVE Sensitive     CEFTRIAXONE <=0.25 SENSITIVE Sensitive     CIPROFLOXACIN <=0.25 SENSITIVE Sensitive     GENTAMICIN <=1 SENSITIVE Sensitive     IMIPENEM <=0.25 SENSITIVE Sensitive     NITROFURANTOIN 64 INTERMEDIATE Intermediate     TRIMETH/SULFA <=20 SENSITIVE Sensitive     AMPICILLIN/SULBACTAM 8 SENSITIVE Sensitive     PIP/TAZO <=4 SENSITIVE Sensitive     * >=100,000 COLONIES/mL KLEBSIELLA PNEUMONIAE   Proteus mirabilis - MIC*    AMPICILLIN <=2 SENSITIVE Sensitive     CEFAZOLIN <=4 SENSITIVE Sensitive     CEFTRIAXONE  <=0.25 SENSITIVE Sensitive     CIPROFLOXACIN <=0.25 SENSITIVE Sensitive     GENTAMICIN <=1 SENSITIVE Sensitive     IMIPENEM 2 SENSITIVE Sensitive     NITROFURANTOIN 128 RESISTANT Resistant     TRIMETH/SULFA <=20 SENSITIVE Sensitive     AMPICILLIN/SULBACTAM <=2 SENSITIVE Sensitive     PIP/TAZO <=4 SENSITIVE Sensitive     * >=100,000 COLONIES/mL PROTEUS MIRABILIS  SARS CORONAVIRUS 2 (TAT 6-24 HRS) Nasopharyngeal Nasopharyngeal Swab     Status: None   Collection Time: 10/10/19  9:17 PM   Specimen: Nasopharyngeal Swab  Result Value Ref Range Status   SARS Coronavirus 2 NEGATIVE NEGATIVE Final    Comment: (NOTE) SARS-CoV-2 target nucleic acids are NOT DETECTED. The SARS-CoV-2 RNA is generally detectable in upper and lower respiratory specimens during the acute phase of infection. Negative results do not preclude SARS-CoV-2 infection, do not rule out co-infections with other pathogens, and should not be used as the sole basis for treatment or other patient management decisions. Negative results must be combined with clinical observations, patient history, and epidemiological information. The expected result is Negative. Fact Sheet for Patients: SugarRoll.be Fact Sheet for Healthcare Providers: https://www.woods-mathews.com/ This test is not yet approved or cleared by the [place] FDA and  has been authorized for detection and/or diagnosis of SARS-CoV-2 by FDA under an Emergency Use Authorization (EUA). This EUA will remain  in effect (meaning this test can be used) for the duration of the COVID-19 declaration under Section 56 4(b)(1) of the Act, 21 U.S.C. section 360bbb-3(b)(1), unless the authorization is terminated or revoked sooner. Performed at [facility], [place] [address], [place], [place] [address]      Studies: No results found.  Flora Lipps, MD  [facility] 10/13/2019

## 2019-10-13 NOTE — Progress Notes (Signed)
Pt talking with eyes closed earlier this morning (approx 0700). Now sleeping. Not alert enough to eat breakfast. Will continue monitor pt.

## 2019-10-13 NOTE — Progress Notes (Signed)
Pt being discharged back to [facility].  Discharge instructions and DNR form in pt packet, sent with PTAR and pt to SNF. Pt without c/o. MD has phone son to alert of pt discharge.  Writer has called [facility] and given report to [place], Therapist, sports.

## 2019-10-13 NOTE — Progress Notes (Signed)
CSW contacted provider via [place] to request signature for FL-2 and D/C summary as requested by [facility].   CSW awaiting return call from provider.  CSW will continue to follow for D/C needs.  Alphonse Guild. Cora Stetson, LCSW, LCAS, CSI Transitions of Care Clinical Social Worker Care Coordination Department Ph: [phone]

## 2019-10-13 NOTE — Progress Notes (Signed)
Pt has been asleep thus far all shift - awoke long enough to take a few of her meds with my attempts to awaken her. Kept spitting most of them out , would not allow for eye drops.

## 2019-10-13 NOTE — Progress Notes (Signed)
CSW called Kayla in admissions at [facility] and again received her office VM and left a message.  CSW spoke again to the admin, was told Lonn [place] is in a mtg and can not answer and stated if Lonn [place] does not immediately call the CSW back CSW would call APS and declare the pt has been abandoned by the facility although notice has been given by several hours that pt is ready for D/C that a report will be filed that continuity of care has not been followed by the facility by not staying in contact with the hospital regarding pt care and that a welfare check will be requested by [employer] to request Lonn [place] be contacted to ensure she is aware that pt is ready for D/C.  CSW will continue to follow for D/C needs.  Alphonse Guild. Kaila Devries, LCSW, LCAS, CSI Transitions of Care Clinical Social Worker Care Coordination Department Ph: [phone]

## 2019-10-13 NOTE — Progress Notes (Signed)
CSW received a call from [facility] and was connected to [place] who stated that the only barrier to D/C is if pt is still on IV antibiotics.  CSW called pt's RN Abigail Butts while Lonn [place] was on the phone and established pt will D/C on Keflex by mouth.  Kayla voiced understanding and stated pt is accepted for return.  Pt's CSW at [facility] called and stated an updated FL-2 with updated diet and designation for Assisted Living with Dementia be on FL-2.  CSW will continue to follow for D/C needs.  Alphonse Guild. Verdella Laidlaw, LCSW, LCAS, CSI Transitions of Care Clinical Social Worker Care Coordination Department Ph: [phone]

## 2019-10-13 NOTE — NC FL2 (Addendum)
[place] LEVEL OF CARE SCREENING TOOL     IDENTIFICATION  Patient Name: Victoria Walsh Birthdate: Jul 30, 1932 Sex: female Admission Date (Current Location): 10/10/2019  [facility] and [place] Number:  Herbalist and Address:  [facility],  [place] [address], [place]      Provider Number: [id number]  Attending Physician Name and Address:  Flora Lipps, MD  Relative Name and Phone Number:  Celsea Heltzel [phone]    Current Level of Care: Hospital Recommended Level of Care: [place] with Dementia Prior Approval Number:    Date Approved/Denied:   PASRR Number:    Discharge Plan: Home(ALF w/Dementia)    Current Diagnoses: Patient Active Problem List   Diagnosis Date Noted  . PAF (paroxysmal atrial fibrillation) ([place])   . Atrial fibrillation with RVR ([place])   . Chronic diastolic CHF (congestive heart failure) ([place])   . Coronary artery disease involving native coronary artery of native heart without angina pectoris   . Essential hypertension   . Acute lower UTI 09/18/2019  . Sepsis ([place]) 09/18/2019  . AKI (acute kidney injury) ([place]) 09/18/2019  . Dementia with behavioral disturbance ([place]) 09/18/2019  . Ischemic stroke ([place]) 09/18/2019  . Hypothyroidism 09/18/2019  . Breast cancer ([place]) 09/18/2019    Orientation RESPIRATION BLADDER Height & Weight     Self  Normal Incontinent Weight: 178 lb 2.1 oz (80.8 kg) Height:     BEHAVIORAL SYMPTOMS/MOOD NEUROLOGICAL BOWEL NUTRITION STATUS  (none) (none) Incontinent Diet(Renal carb-modified)  AMBULATORY STATUS COMMUNICATION OF NEEDS Skin   Total Care Verbally (Pt has a foam dressing on her sacram for protection)                       Personal Care Assistance Level of Assistance  Bathing, Dressing Bathing Assistance: Maximum assistance Feeding assistance: Maximum assistance Dressing Assistance: Maximum assistance     Functional Limitations Info  Sight,  Hearing, Speech Sight Info: Adequate Hearing Info: Adequate Speech Info: Adequate    SPECIAL CARE FACTORS FREQUENCY  PT (By licensed PT), OT (By licensed OT)     PT Frequency: 5 OT Frequency: 5            Contractures Contractures Info: Not present    Additional Factors Info  Code Status, Allergies Code Status Info: DNR Allergies Info: Ciprofloxacin, Oxaprozin, Sulfa Antibiotics           Current Medications (10/13/2019):  This is the current hospital active medication list Current Facility-Administered Medications  Medication Dose Route Frequency Provider Last Rate Last Admin  . acetaminophen (TYLENOL) tablet 650 mg  650 mg Oral Q6H PRN Elwyn Reach, MD       Or  . acetaminophen (TYLENOL) suppository 650 mg  650 mg Rectal Q6H PRN Elwyn Reach, MD      . acetaminophen (TYLENOL) tablet 650 mg  650 mg Oral Q6H PRN Elwyn Reach, MD   650 mg at 10/11/19 1111  . apixaban (ELIQUIS) tablet 5 mg  5 mg Oral BID Pokhrel, Laxman, MD   5 mg at 10/13/19 0002  . atorvastatin (LIPITOR) tablet 20 mg  20 mg Oral q1800 Elwyn Reach, MD   20 mg at 10/11/19 1754  . brimonidine (ALPHAGAN) 0.2 % ophthalmic solution 1 drop  1 drop Left Eye BID Gala Romney L, MD   1 drop at 10/13/19 1045   And  . timolol (TIMOPTIC) 0.5 % ophthalmic solution 1 drop  1 drop Left Eye BID Elwyn Reach, MD   1 drop at 10/13/19 1045  . brinzolamide (AZOPT) 1 % ophthalmic suspension 1 drop  1 drop Left Eye BID Elwyn Reach, MD   1 drop at 10/13/19 1046  . camphor-menthol (SARNA) lotion 1 application  1 application Topical [id number] PRN Elwyn Reach, MD       And  . hydrOXYzine (ATARAX/VISTARIL) tablet 25 mg  25 mg Oral Q8H PRN Jonelle Sidle, Mohammad L, MD      . cefTRIAXone (ROCEPHIN) 1 g in sodium chloride 0.9 % 100 mL IVPB  1 g Intravenous Q24H Pokhrel, Laxman, MD      . Chlorhexidine Gluconate Cloth 2 % PADS 6 each  6 each Topical Daily Pokhrel, Laxman, MD   6 each at 10/13/19 1044  .  diltiazem (CARDIZEM CD) 24 hr capsule 180 mg  180 mg Oral Daily Gala Romney L, MD   180 mg at 10/12/19 0959  . docusate sodium (COLACE) capsule 100 mg  100 mg Oral Daily Gala Romney L, MD   100 mg at 10/12/19 0959  . docusate sodium (ENEMEEZ) enema 283 mg  1 enema Rectal PRN Gala Romney L, MD      . DULoxetine (CYMBALTA) DR capsule 20 mg  20 mg Oral QHS Gala Romney L, MD   20 mg at 10/11/19 2249  . feeding supplement (ENSURE ENLIVE) (ENSURE ENLIVE) liquid 237 mL  237 mL Oral Daily Garba, Mohammad L, MD      . feeding supplement (NEPRO CARB STEADY) liquid 237 mL  237 mL Oral TID PRN Jonelle Sidle, Mohammad L, MD      . latanoprost (XALATAN) 0.005 % ophthalmic solution 1 drop  1 drop Left Eye QHS Elwyn Reach, MD   1 drop at 10/13/19 0014  . letrozole [facility]) tablet 2.5 mg  2.5 mg Oral Daily Gala Romney L, MD   2.5 mg at 10/12/19 1010  . levothyroxine (SYNTHROID) tablet 100 mcg  100 mcg Oral Q0600 Elwyn Reach, MD   100 mcg at 10/12/19 [id number]  . liver oil-zinc oxide (DESITIN) 40 % ointment 1 application  1 application Topical BID Elwyn Reach, MD   1 application at 0000000 1043  . Melatonin TABS 3 mg  3 mg Oral QHS Gala Romney L, MD   3 mg at 10/13/19 0002  . metoprolol tartrate (LOPRESSOR) tablet 12.5 mg  12.5 mg Oral BID Gala Romney L, MD   12.5 mg at 10/13/19 0002  . multivitamin with minerals tablet 1 tablet  1 tablet Oral QHS Elwyn Reach, MD   1 tablet at 10/13/19 0003  . ondansetron (ZOFRAN) tablet 4 mg  4 mg Oral Q6H PRN Elwyn Reach, MD       Or  . ondansetron (ZOFRAN) injection 4 mg  4 mg Intravenous Q6H PRN Gala Romney L, MD      . pantoprazole (PROTONIX) EC tablet 40 mg  40 mg Oral Daily Elwyn Reach, MD   40 mg at 10/12/19 0959  . pregabalin (LYRICA) capsule 25 mg  25 mg Oral QHS Gala Romney L, MD   25 mg at 10/13/19 0002  . psyllium (HYDROCIL/METAMUCIL) packet 1 packet  1 packet Oral QHS Elwyn Reach, MD   1 packet at  10/11/19 2249  . senna (SENOKOT) tablet 8.6 mg  1 tablet Oral Once per day on Mon Wed Fri Garba, Mohammad L, MD   8.6 mg at 10/11/19 0001  .  sorbitol 70 % solution 30 mL  30 mL Oral PRN Gala Romney L, MD      . zolpidem (AMBIEN) tablet 5 mg  5 mg Oral QHS PRN Elwyn Reach, MD         Discharge Medications: Please see discharge summary for a list of discharge medications.  Relevant Imaging Results:  Relevant Lab Results:   Additional Information [id number]  Alphonse Guild Shere Eisenhart, LCSW

## 2019-10-13 NOTE — Progress Notes (Signed)
Pt leaving with chronic Foley for neurogenic bladder.

## 2019-10-13 NOTE — Progress Notes (Signed)
CSW spoke to pt's RN who states pt is ready for D/C per the provider.  CSW called [facility]  And left a HIPPA-compliant VM requesting a call back.  CSW will continue to follow for D/C needs.  Alphonse Guild. Byanca Kasper, LCSW, LCAS, CSI Transitions of Care Clinical Social Worker Care Coordination Department Ph: [phone]

## 2019-10-13 NOTE — Discharge Summary (Signed)
Physician Discharge Summary  Victoria Walsh [id number] DOB: 07-01-1932 DOA: 10/10/2019  PCP: Reymundo Poll, MD  Admit date: 10/10/2019 Discharge date: 10/13/2019  Admitted From: Home  Discharge disposition: ALF   Recommendations for Outpatient Follow-Up:   . Follow up with your primary care provider discharge living facility in 3 to 5 days . Check CBC, BMP in the next visit . Encourage oral fluids. . Complete the course of Keflex next 5 days. . Foley catheter change as per schedule . Patient did have mild hematuria likely secondary to UTI which has improved.  Hemoglobin remained stable.  Has been restarted on Eliquis.  Discharge Diagnosis:   Principal Problem:   AKI (acute kidney injury) ([place]) Active Problems:   Acute lower UTI   Dementia with behavioral disturbance (HCC)   Hypothyroidism   Chronic diastolic CHF (congestive heart failure) (HCC)   Essential hypertension   PAF (paroxysmal atrial fibrillation) ([place])   Discharge Condition: Improved.  Diet recommendation: Low sodium, heart healthy.    Wound care: None.  Code status: DNR   History of Present Illness:   Audrieanna Worthing a 86 y.o.femalewith medical history significant ofdementia, CVA with residual left-sided weakness, atrial fibrillation, recurrent UTI, chronic kidney disease, hyperlipidemia  was brought in from the memory care unit with complaints of gross hematuria in the Foley bag. Patient was also noted to be febrile and weak. Hematuria was noted with urinalysis consistent with urinary tract infection. Patient was recently treated with Macrobid for her UTI. She has significant allergies to most antibiotics. Patient is on chronic anticoagulation with Eliquis. She is also had recent infection with ESBL E. coli. She typically communicates but they noted decrease in her responsiveness.   ED Course:Temperature 97.9 blood pressure 118/53 pulse 79 respirate of 17 oxygen sat 100% on room air. Chemistry  showed glucose 131 creatinine 1.77 and BUN of 71. Previous BUN was 17 with creatinine 0.69- 10 days ago. White count was 10.2 hemoglobin 10.9 and platelets 330. Urinalysis showed turbid urine positive nitrite many bacteria, WBC more than 50. Urine cultures were obtained, blood culture obtained and patient initiated on antibiotics and was admitted to the hospital for treatment.   Hospital Course:   Following conditions were addressed during hospitalization as listed below,  Acute metabolic encephalopathy secondary to UTI on the background of dementia.  More communicative today. Has baseline dementia.  Likely at her baseline at this time.  Acute kidney injury on CKD IIIa: likely secondary to volume depletion.  Resolved. CT scan shows multiple calculus but no obstruction or hydronephrosis.  Patient has received IV fluids.  Improved renal function at this time.  Creatinine of 1.08 today. Off IV fluids.  Encourage oral fluids on discharge.  Mild hypokalemia.  Improved with replacement.  Check BMP in 3 to 5 days.  Catheter associated UTI with hematuria. Present on admission.  On IV meropenem based on previous sensitivities.   Urine culture showing Klebsiella pneumoniae and proteus. Sensitive to Rocephin- consider keflex on discharge. On indwelling foley catheter for several years due to neurogenic bladder.  Afebrile without leukocytosis.  Hemoglobin of 9.3.  Hemoglobin was 8.3 yesterday.  Has been restarted on Eliquis.  Dementia: Patient is at the memory care unit.  Likely at baseline at this time.  History of paroxysmal atrial fibrillation: Rate controlled. on eliquis, metoprolol and cardizem.   Chronic diastolic CHF: Compensated at this time.   Essential hypertension:  Resume metoprolol, Cardizem.  hypothyroidism:Continue levothyroxine  History of constipation.  On multiple bowel regimen.  Continue with that.  Debility, deconditioning. Wheel chair bound.  Currently at  assisted living facility.  Disposition.  At this time, patient is stable for disposition back to assisted living facility.  I spoke with the patient's son and updated him about the clinical condition of the patient and the plan for disposition to assisted living facility today.  Medical Consultants:    None.  Procedures:    None Subjective:   Today, patient was seen and examined at bedside.  Patient denies pain.  Poor historian.  Discharge Exam:   Vitals:   10/13/19 0611 10/13/19 1302  BP: 109/69 121/65  Pulse: (!) 52 (!) 56  Resp: 18 18  Temp: 97.8 F (36.6 C) 97.9 F (36.6 C)  SpO2: 99% 100%   Vitals:   10/12/19 2042 10/13/19 0500 10/13/19 0611 10/13/19 1302  BP: (!) 117/52  109/69 121/65  Pulse: 61  (!) 52 (!) 56  Resp: 18  18 18   Temp: (!) 97.3 F (36.3 C)  97.8 F (36.6 C) 97.9 F (36.6 C)  TempSrc: Oral  Oral Oral  SpO2: 97%  99% 100%  Weight:  80.8 kg      GENERAL: Patient is alert awake and oriented to self.  Not in obvious distress.  Communicative HENT: No scleral pallor or icterus. Pupils equally reactive to light. Oral mucosa is dry NECK: is supple, no gross swelling noted. CHEST: Clear to auscultation. No crackles or wheezes.  Diminished breath sounds bilaterally. CVS: S1 and S2 heard, no murmur.  Irregular rhythm ABDOMEN: Soft, non-tender, bowel sounds are present.  Foley catheter in place EXTREMITIES: No edema. CNS: Cranial nerves are intact.  Moving all extremities, left sided weakness SKIN: warm and dry without rashes.  The results of significant diagnostics from this hospitalization (including imaging, microbiology, ancillary and laboratory) are listed below for reference.     Diagnostic Studies:   CT Renal Stone Study  Result Date: 10/10/2019 CLINICAL DATA:  Hematuria. Passing blood clots in Foley bag. Nonambulatory. EXAM: CT ABDOMEN AND PELVIS WITHOUT CONTRAST TECHNIQUE: Multidetector CT imaging of the abdomen and pelvis was performed  following the standard protocol without IV contrast. COMPARISON:  None. FINDINGS: Lower chest: Trace bilateral pleural effusions with mild dependent atelectasis at both lung bases. The heart is mildly enlarged. There is no significant pericardial fluid. Atherosclerosis of the aorta and coronary arteries noted. Hepatobiliary: Small cysts in the left hepatic lobe. No suspicious hepatic findings on noncontrast imaging. The gallbladder is mildly distended with possible dependent sludge. No evidence of gallbladder wall thickening, surrounding inflammation or significant biliary dilatation. Pancreas: Diffuse atrophy and fatty replacement. No evidence of pancreatic ductal dilatation or surrounding inflammation. Spleen: Normal in size without focal abnormality. Adrenals/Urinary Tract: Both adrenal glands appear normal. There are calculi in the renal pelves bilaterally. There is a discrete calculus in the right renal pelvis measuring up to 7 mm on image 34/2. There are less well-defined calcifications within the left renal pelvis and infundibula. There is no ureteral calculus, hydronephrosis or perinephric soft tissue stranding. A 6.6 cm cyst is present in the upper pole of the right kidney. There is a possible smaller cyst in the upper pole of the left kidney. The bladder is decompressed by Foley catheter. A small right-sided bladder calculus is noted (image 80/2). Stomach/Bowel: No evidence of bowel wall thickening, distention or surrounding inflammatory change. The appendix appears normal. Mild diverticular changes within the distal colon. Vascular/Lymphatic: There are no enlarged abdominal or pelvic lymph nodes. Diffuse aortic and  branch vessel atherosclerosis. No acute vascular findings on noncontrast imaging. Reproductive: Hysterectomy. Well-circumscribed right adnexal mass measures near water density and 7.5 x 5.2 cm on image 68/2. No suspicious left adnexal findings. Other: No evidence of abdominal wall mass or  hernia. No ascites. Musculoskeletal: No acute or significant osseous findings. There are degenerative changes throughout the spine associated with a convex left thoracolumbar scoliosis. Previous proximal left femoral ORIF. IMPRESSION: 1. 7 mm calculus in the right renal pelvis. There are less well-defined calcifications within the left renal pelvis and infundibula. No evidence of ureteral calculus, hydronephrosis or perinephric soft tissue stranding. 2. Small bladder calculus. 3. Well-circumscribed right adnexal mass measuring up to 7.5 cm in diameter is likely cystic but incompletely evaluated by this noncontrast study. This could be further evaluated with pelvic ultrasound. 4. Trace bilateral pleural effusions with mild dependent atelectasis at both lung bases. Aortic Atherosclerosis (ICD10-I70.0). Electronically Signed   By: Richardean Sale M.D.   On: 10/10/2019 19:07     Labs:   Basic Metabolic Panel: Recent Labs  Lab 10/10/19 1700 10/10/19 1700 10/11/19 0739 10/11/19 0739 10/12/19 0454 10/13/19 0539  NA 141  --  143  --  143 146*  K 4.1   < > 3.8   < > 3.3* 4.0  CL 105  --  111  --  114* 118*  CO2 25  --  22  --  20* 21*  GLUCOSE 131*  --  107*  --  131* 106*  BUN 71*  --  55*  --  41* 30*  CREATININE 1.77*  --  1.30*  --  1.18* 1.08*  CALCIUM 9.0  --  8.3*  --  8.1* 8.6*  MG  --   --   --   --  1.9  --    < > = values in this interval not displayed.   GFR Estimated Creatinine Clearance: 42.5 mL/min (A) (by C-G formula based on SCr of 1.08 mg/dL (H)). Liver Function Tests: No results for input(s): AST, ALT, ALKPHOS, BILITOT, PROT, ALBUMIN in the last 168 hours. No results for input(s): LIPASE, AMYLASE in the last 168 hours. No results for input(s): AMMONIA in the last 168 hours. Coagulation profile No results for input(s): INR, PROTIME in the last 168 hours.  CBC: Recent Labs  Lab 10/10/19 1700 10/11/19 0503 10/12/19 0454 10/13/19 0539  WBC 10.2 9.3 9.8 8.9  NEUTROABS  7.0  --   --   --   HGB 10.9* 10.3* 8.9* 9.3*  HCT 34.7* 32.9* 29.4* 30.6*  MCV 98.9 97.3 101.0* 100.7*  PLT 330 249 245 245   Cardiac Enzymes: No results for input(s): CKTOTAL, CKMB, CKMBINDEX, TROPONINI in the last 168 hours. BNP: Invalid input(s): POCBNP CBG: No results for input(s): GLUCAP in the last 168 hours. D-Dimer No results for input(s): DDIMER in the last 72 hours. Hgb A1c No results for input(s): HGBA1C in the last 72 hours. Lipid Profile No results for input(s): CHOL, HDL, LDLCALC, TRIG, CHOLHDL, LDLDIRECT in the last 72 hours. Thyroid function studies No results for input(s): TSH, T4TOTAL, T3FREE, THYROIDAB in the last 72 hours.  Invalid input(s): FREET3 Anemia work up No results for input(s): VITAMINB12, FOLATE, FERRITIN, TIBC, IRON, RETICCTPCT in the last 72 hours. Microbiology Recent Results (from the past 240 hour(s))  Urine Culture     Status: Abnormal   Collection Time: 10/10/19  4:26 PM   Specimen: Urine, Random  Result Value Ref Range Status   Specimen Description  Final    URINE, RANDOM Performed at [facility], [place] [address] [place], [place] [address]    Special Requests   Final    NONE Performed at [facility], [place] [address] [place], [place] [address]    Culture (A)  Final    >=100,000 COLONIES/mL KLEBSIELLA PNEUMONIAE >=100,000 COLONIES/mL PROTEUS MIRABILIS    Report Status 10/13/2019 FINAL  Final   Organism ID, Bacteria KLEBSIELLA PNEUMONIAE (A)  Final   Organism ID, Bacteria PROTEUS MIRABILIS (A)  Final      Susceptibility   Klebsiella pneumoniae - MIC*    AMPICILLIN >=32 RESISTANT Resistant     CEFAZOLIN <=4 SENSITIVE Sensitive     CEFTRIAXONE <=0.25 SENSITIVE Sensitive     CIPROFLOXACIN <=0.25 SENSITIVE Sensitive     GENTAMICIN <=1 SENSITIVE Sensitive     IMIPENEM <=0.25 SENSITIVE Sensitive     NITROFURANTOIN 64 INTERMEDIATE Intermediate     TRIMETH/SULFA <=20 SENSITIVE Sensitive      AMPICILLIN/SULBACTAM 8 SENSITIVE Sensitive     PIP/TAZO <=4 SENSITIVE Sensitive     * >=100,000 COLONIES/mL KLEBSIELLA PNEUMONIAE   Proteus mirabilis - MIC*    AMPICILLIN <=2 SENSITIVE Sensitive     CEFAZOLIN <=4 SENSITIVE Sensitive     CEFTRIAXONE <=0.25 SENSITIVE Sensitive     CIPROFLOXACIN <=0.25 SENSITIVE Sensitive     GENTAMICIN <=1 SENSITIVE Sensitive     IMIPENEM 2 SENSITIVE Sensitive     NITROFURANTOIN 128 RESISTANT Resistant     TRIMETH/SULFA <=20 SENSITIVE Sensitive     AMPICILLIN/SULBACTAM <=2 SENSITIVE Sensitive     PIP/TAZO <=4 SENSITIVE Sensitive     * >=100,000 COLONIES/mL PROTEUS MIRABILIS  SARS CORONAVIRUS 2 (TAT 6-24 HRS) Nasopharyngeal Nasopharyngeal Swab     Status: None   Collection Time: 10/10/19  9:17 PM   Specimen: Nasopharyngeal Swab  Result Value Ref Range Status   SARS Coronavirus 2 NEGATIVE NEGATIVE Final    Comment: (NOTE) SARS-CoV-2 target nucleic acids are NOT DETECTED. The SARS-CoV-2 RNA is generally detectable in upper and lower respiratory specimens during the acute phase of infection. Negative results do not preclude SARS-CoV-2 infection, do not rule out co-infections with other pathogens, and should not be used as the sole basis for treatment or other patient management decisions. Negative results must be combined with clinical observations, patient history, and epidemiological information. The expected result is Negative. Fact Sheet for Patients: SugarRoll.be Fact Sheet for Healthcare Providers: https://www.woods-mathews.com/ This test is not yet approved or cleared by the [place] FDA and  has been authorized for detection and/or diagnosis of SARS-CoV-2 by FDA under an Emergency Use Authorization (EUA). This EUA will remain  in effect (meaning this test can be used) for the duration of the COVID-19 declaration under Section 56 4(b)(1) of the Act, 21 U.S.C. section 360bbb-3(b)(1), unless the  authorization is terminated or revoked sooner. Performed at [facility], [place] [address], [place],  [address]      Discharge Instructions:   Discharge Instructions    Diet - low sodium heart healthy   Complete by: As directed    Discharge instructions   Complete by: As directed    Complete  the course of antibiotic.  Follow-up with your primary care provider at the assisted living facility in 3 to 5 days.  Encourage oral fluids   Increase activity slowly   Complete by: As directed      Allergies as of 10/13/2019      Reactions   Ciprofloxacin  Other (See Comments)   On MAR   Oxaprozin Other (See Comments)   DAYPRO (SULFA DRUG) On MAR   Sulfa Antibiotics Other (See Comments)   On MAR      Medication List    STOP taking these medications   nitrofurantoin (macrocrystal-monohydrate) 100 MG capsule Commonly known as: MACROBID     TAKE these medications   acetaminophen 325 MG tablet Commonly known as: TYLENOL Take 650 mg by mouth every 6 (six) hours as needed for moderate pain.   apixaban 5 MG Tabs tablet Commonly known as: ELIQUIS Take 1 tablet (5 mg total) by mouth 2 (two) times daily.   atorvastatin 20 MG tablet Commonly known as: LIPITOR Take 1 tablet (20 mg total) by mouth daily at 6 PM.   brinzolamide 1 % ophthalmic suspension Commonly known as: AZOPT Place 1 drop into the left eye 2 (two) times daily.   Calmoseptine 0.44-20.6 % Oint Generic drug: Menthol-Zinc Oxide Apply 1 application topically 2 (two) times daily. Apply to buttocks for rednedd   cephALEXin 250 MG capsule Commonly known as: KEFLEX Take 1 capsule (250 mg total) by mouth 4 (four) times daily for 5 days.   Combigan 0.2-0.5 % ophthalmic solution Generic drug: brimonidine-timolol Place 1 drop into the left eye every 12 (twelve) hours.   Cranberry 450 MG Caps Take 450 mg by mouth 2 (two) times daily.   diltiazem 180 MG 24 hr capsule Commonly known as: CARDIZEM CD Take 1  capsule (180 mg total) by mouth daily.   docusate sodium 100 MG capsule Commonly known as: COLACE Take 100 mg by mouth daily.   DULoxetine 20 MG capsule Commonly known as: CYMBALTA Take 20 mg by mouth at bedtime.   Ensure Plus Liqd Take 237 mLs by mouth daily.   latanoprost 0.005 % ophthalmic solution Commonly known as: XALATAN Place 1 drop into the left eye at bedtime.   letrozole 2.5 MG tablet Commonly known as: FEMARA Take 2.5 mg by mouth daily.   levothyroxine 100 MCG tablet Commonly known as: SYNTHROID Take 100 mcg by mouth daily before breakfast.   Melatonin 3 MG Tabs Take 3 mg by mouth at bedtime.   metoprolol tartrate 25 MG tablet Commonly known as: LOPRESSOR Take 0.5 tablets (12.5 mg total) by mouth 2 (two) times daily.   multivitamins with iron Tabs tablet Take 1 tablet by mouth at bedtime.   pantoprazole 40 MG tablet Commonly known as: PROTONIX Take 1 tablet (40 mg total) by mouth daily.   pregabalin 25 MG capsule Commonly known as: LYRICA Take 25 mg by mouth at bedtime.   Psyllium 500 MG Caps Take 500 mg by mouth at bedtime.   senna 8.6 MG Tabs tablet Commonly known as: SENOKOT Take 1 tablet by mouth See admin instructions. Monday, Wednesday and Friday in the evening      Contact information for after-discharge care    Destination    [facility] .   Service: Assisted Living Contact information: [place] [phone]               Time coordinating discharge: 39 minutes  Signed:  Merie Wulf  [facility] 10/13/2019, 4:06 PM

## 2019-11-05 ENCOUNTER — Emergency Department (HOSPITAL_COMMUNITY): Payer: Medicare Other

## 2019-11-05 ENCOUNTER — Other Ambulatory Visit: Payer: Self-pay

## 2019-11-05 ENCOUNTER — Encounter (HOSPITAL_COMMUNITY): Payer: Self-pay | Admitting: Internal Medicine

## 2019-11-05 ENCOUNTER — Emergency Department (HOSPITAL_COMMUNITY): Payer: Medicare Other | Admitting: Certified Registered Nurse Anesthetist

## 2019-11-05 ENCOUNTER — Encounter: Payer: Self-pay | Admitting: Physician Assistant

## 2019-11-05 ENCOUNTER — Encounter (HOSPITAL_COMMUNITY): Admission: EM | Disposition: E | Payer: Self-pay | Source: Skilled Nursing Facility | Attending: Internal Medicine

## 2019-11-05 ENCOUNTER — Other Ambulatory Visit: Payer: Self-pay | Admitting: Urology

## 2019-11-05 ENCOUNTER — Inpatient Hospital Stay (HOSPITAL_COMMUNITY)
Admission: EM | Admit: 2019-11-05 | Discharge: 2019-11-15 | DRG: 698 | Disposition: E | Payer: Medicare Other | Source: Skilled Nursing Facility | Attending: Internal Medicine | Admitting: Internal Medicine

## 2019-11-05 DIAGNOSIS — T83511A Infection and inflammatory reaction due to indwelling urethral catheter, initial encounter: Secondary | ICD-10-CM | POA: Diagnosis present

## 2019-11-05 DIAGNOSIS — D539 Nutritional anemia, unspecified: Secondary | ICD-10-CM | POA: Diagnosis present

## 2019-11-05 DIAGNOSIS — Z20822 Contact with and (suspected) exposure to covid-19: Secondary | ICD-10-CM | POA: Diagnosis present

## 2019-11-05 DIAGNOSIS — N132 Hydronephrosis with renal and ureteral calculous obstruction: Secondary | ICD-10-CM

## 2019-11-05 DIAGNOSIS — F0391 Unspecified dementia with behavioral disturbance: Secondary | ICD-10-CM

## 2019-11-05 DIAGNOSIS — Z66 Do not resuscitate: Secondary | ICD-10-CM | POA: Diagnosis present

## 2019-11-05 DIAGNOSIS — N183 Chronic kidney disease, stage 3 unspecified: Secondary | ICD-10-CM

## 2019-11-05 DIAGNOSIS — E039 Hypothyroidism, unspecified: Secondary | ICD-10-CM | POA: Diagnosis present

## 2019-11-05 DIAGNOSIS — I130 Hypertensive heart and chronic kidney disease with heart failure and stage 1 through stage 4 chronic kidney disease, or unspecified chronic kidney disease: Secondary | ICD-10-CM | POA: Diagnosis present

## 2019-11-05 DIAGNOSIS — Z79811 Long term (current) use of aromatase inhibitors: Secondary | ICD-10-CM

## 2019-11-05 DIAGNOSIS — B961 Klebsiella pneumoniae [K. pneumoniae] as the cause of diseases classified elsewhere: Secondary | ICD-10-CM

## 2019-11-05 DIAGNOSIS — Z7901 Long term (current) use of anticoagulants: Secondary | ICD-10-CM

## 2019-11-05 DIAGNOSIS — Z515 Encounter for palliative care: Secondary | ICD-10-CM | POA: Diagnosis present

## 2019-11-05 DIAGNOSIS — I10 Essential (primary) hypertension: Secondary | ICD-10-CM

## 2019-11-05 DIAGNOSIS — I5032 Chronic diastolic (congestive) heart failure: Secondary | ICD-10-CM

## 2019-11-05 DIAGNOSIS — N179 Acute kidney failure, unspecified: Secondary | ICD-10-CM

## 2019-11-05 DIAGNOSIS — I2510 Atherosclerotic heart disease of native coronary artery without angina pectoris: Secondary | ICD-10-CM | POA: Diagnosis present

## 2019-11-05 DIAGNOSIS — N139 Obstructive and reflux uropathy, unspecified: Secondary | ICD-10-CM

## 2019-11-05 DIAGNOSIS — F03918 Unspecified dementia, unspecified severity, with other behavioral disturbance: Secondary | ICD-10-CM | POA: Diagnosis present

## 2019-11-05 DIAGNOSIS — M8580 Other specified disorders of bone density and structure, unspecified site: Secondary | ICD-10-CM | POA: Diagnosis present

## 2019-11-05 DIAGNOSIS — N1832 Chronic kidney disease, stage 3b: Secondary | ICD-10-CM | POA: Diagnosis present

## 2019-11-05 DIAGNOSIS — A498 Other bacterial infections of unspecified site: Secondary | ICD-10-CM

## 2019-11-05 DIAGNOSIS — Z87440 Personal history of urinary (tract) infections: Secondary | ICD-10-CM

## 2019-11-05 DIAGNOSIS — N390 Urinary tract infection, site not specified: Secondary | ICD-10-CM | POA: Diagnosis not present

## 2019-11-05 DIAGNOSIS — R6520 Severe sepsis without septic shock: Secondary | ICD-10-CM

## 2019-11-05 DIAGNOSIS — G9341 Metabolic encephalopathy: Secondary | ICD-10-CM

## 2019-11-05 DIAGNOSIS — D72829 Elevated white blood cell count, unspecified: Secondary | ICD-10-CM

## 2019-11-05 DIAGNOSIS — E785 Hyperlipidemia, unspecified: Secondary | ICD-10-CM | POA: Diagnosis present

## 2019-11-05 DIAGNOSIS — Z79890 Hormone replacement therapy (postmenopausal): Secondary | ICD-10-CM

## 2019-11-05 DIAGNOSIS — I480 Paroxysmal atrial fibrillation: Secondary | ICD-10-CM

## 2019-11-05 DIAGNOSIS — N210 Calculus in bladder: Secondary | ICD-10-CM | POA: Diagnosis present

## 2019-11-05 DIAGNOSIS — A4159 Other Gram-negative sepsis: Secondary | ICD-10-CM | POA: Diagnosis present

## 2019-11-05 DIAGNOSIS — R7881 Bacteremia: Secondary | ICD-10-CM

## 2019-11-05 DIAGNOSIS — C50919 Malignant neoplasm of unspecified site of unspecified female breast: Secondary | ICD-10-CM | POA: Diagnosis present

## 2019-11-05 DIAGNOSIS — Z8673 Personal history of transient ischemic attack (TIA), and cerebral infarction without residual deficits: Secondary | ICD-10-CM

## 2019-11-05 DIAGNOSIS — A419 Sepsis, unspecified organism: Secondary | ICD-10-CM

## 2019-11-05 DIAGNOSIS — Z978 Presence of other specified devices: Secondary | ICD-10-CM

## 2019-11-05 DIAGNOSIS — N131 Hydronephrosis with ureteral stricture, not elsewhere classified: Secondary | ICD-10-CM

## 2019-11-05 DIAGNOSIS — N136 Pyonephrosis: Secondary | ICD-10-CM | POA: Diagnosis present

## 2019-11-05 DIAGNOSIS — Z7189 Other specified counseling: Secondary | ICD-10-CM

## 2019-11-05 DIAGNOSIS — Z79899 Other long term (current) drug therapy: Secondary | ICD-10-CM

## 2019-11-05 LAB — CBC WITH DIFFERENTIAL/PLATELET
Abs Immature Granulocytes: 0.18 10*3/uL — ABNORMAL HIGH (ref 0.00–0.07)
Abs Immature Granulocytes: 0.12 10*3/uL — ABNORMAL HIGH (ref 0.00–0.07)
Basophils Absolute: 0.1 10*3/uL (ref 0.0–0.1)
Basophils Absolute: 0 10*3/uL (ref 0.0–0.1)
Basophils Relative: 0 %
Basophils Relative: 0 %
Eosinophils Absolute: 0 10*3/uL (ref 0.0–0.5)
Eosinophils Absolute: 0 10*3/uL (ref 0.0–0.5)
Eosinophils Relative: 0 %
Eosinophils Relative: 0 %
HCT: 32.3 % — ABNORMAL LOW (ref 36.0–46.0)
HCT: 22.7 % — ABNORMAL LOW (ref 36.0–46.0)
Hemoglobin: 9.3 g/dL — ABNORMAL LOW (ref 12.0–15.0)
Hemoglobin: 6.6 g/dL — CL (ref 12.0–15.0)
Immature Granulocytes: 1 %
Immature Granulocytes: 1 %
Lymphocytes Relative: 2 %
Lymphocytes Relative: 3 %
Lymphs Abs: 0.5 10*3/uL — ABNORMAL LOW (ref 0.7–4.0)
Lymphs Abs: 0.5 10*3/uL — ABNORMAL LOW (ref 0.7–4.0)
MCH: 30.6 pg (ref 26.0–34.0)
MCH: 30.1 pg (ref 26.0–34.0)
MCHC: 28.8 g/dL — ABNORMAL LOW (ref 30.0–36.0)
MCHC: 29.1 g/dL — ABNORMAL LOW (ref 30.0–36.0)
MCV: 106.3 fL — ABNORMAL HIGH (ref 80.0–100.0)
MCV: 103.7 fL — ABNORMAL HIGH (ref 80.0–100.0)
Monocytes Absolute: 1 10*3/uL (ref 0.1–1.0)
Monocytes Absolute: 0.9 10*3/uL (ref 0.1–1.0)
Monocytes Relative: 5 %
Monocytes Relative: 5 %
Neutro Abs: 19.2 10*3/uL — ABNORMAL HIGH (ref 1.7–7.7)
Neutro Abs: 15.7 10*3/uL — ABNORMAL HIGH (ref 1.7–7.7)
Neutrophils Relative %: 92 %
Neutrophils Relative %: 91 %
Platelets: 158 10*3/uL (ref 150–400)
Platelets: 127 10*3/uL — ABNORMAL LOW (ref 150–400)
RBC: 3.04 MIL/uL — ABNORMAL LOW (ref 3.87–5.11)
RBC: 2.19 MIL/uL — ABNORMAL LOW (ref 3.87–5.11)
RDW: 15.7 % — ABNORMAL HIGH (ref 11.5–15.5)
RDW: 15.7 % — ABNORMAL HIGH (ref 11.5–15.5)
WBC: 20.8 10*3/uL — ABNORMAL HIGH (ref 4.0–10.5)
WBC: 17.2 10*3/uL — ABNORMAL HIGH (ref 4.0–10.5)
nRBC: 0 % (ref 0.0–0.2)
nRBC: 0 % (ref 0.0–0.2)

## 2019-11-05 LAB — BASIC METABOLIC PANEL
Anion gap: 10 (ref 5–15)
BUN: 44 mg/dL — ABNORMAL HIGH (ref 8–23)
CO2: 18 mmol/L — ABNORMAL LOW (ref 22–32)
Calcium: 7.1 mg/dL — ABNORMAL LOW (ref 8.9–10.3)
Chloride: 117 mmol/L — ABNORMAL HIGH (ref 98–111)
Creatinine, Ser: 2.53 mg/dL — ABNORMAL HIGH (ref 0.44–1.00)
GFR calc Af Amer: 19 mL/min — ABNORMAL LOW (ref >60)
GFR calc non Af Amer: 17 mL/min — ABNORMAL LOW (ref >60)
Glucose, Bld: 148 mg/dL — ABNORMAL HIGH (ref 70–99)
Potassium: 3.9 mmol/L (ref 3.5–5.1)
Sodium: 145 mmol/L (ref 135–145)

## 2019-11-05 LAB — COMPREHENSIVE METABOLIC PANEL
ALT: 17 U/L (ref 0–44)
AST: 28 U/L (ref 15–41)
Albumin: 3.4 g/dL — ABNORMAL LOW (ref 3.5–5.0)
Alkaline Phosphatase: 77 U/L (ref 38–126)
Anion gap: 17 — ABNORMAL HIGH (ref 5–15)
BUN: 48 mg/dL — ABNORMAL HIGH (ref 8–23)
CO2: 21 mmol/L — ABNORMAL LOW (ref 22–32)
Calcium: 8.7 mg/dL — ABNORMAL LOW (ref 8.9–10.3)
Chloride: 107 mmol/L (ref 98–111)
Creatinine, Ser: 3.03 mg/dL — ABNORMAL HIGH (ref 0.44–1.00)
GFR calc Af Amer: 16 mL/min — ABNORMAL LOW (ref >60)
GFR calc non Af Amer: 13 mL/min — ABNORMAL LOW (ref >60)
Glucose, Bld: 116 mg/dL — ABNORMAL HIGH (ref 70–99)
Potassium: 3.4 mmol/L — ABNORMAL LOW (ref 3.5–5.1)
Sodium: 145 mmol/L (ref 135–145)
Total Bilirubin: 0.8 mg/dL (ref 0.3–1.2)
Total Protein: 6.6 g/dL (ref 6.5–8.1)

## 2019-11-05 LAB — URINALYSIS, ROUTINE W REFLEX MICROSCOPIC
Bilirubin Urine: NEGATIVE
Glucose, UA: NEGATIVE mg/dL
Ketones, ur: 5 mg/dL — AB
Nitrite: POSITIVE — AB
Protein, ur: 100 mg/dL — AB
RBC / HPF: >50 RBC/hpf — ABNORMAL HIGH (ref 0–5)
Specific Gravity, Urine: 1.016 (ref 1.005–1.030)
WBC, UA: >50 WBC/hpf — ABNORMAL HIGH (ref 0–5)
pH: 8 (ref 5.0–8.0)

## 2019-11-05 LAB — RESPIRATORY PANEL BY RT PCR (FLU A&B, COVID)
Influenza A by PCR: NEGATIVE
Influenza B by PCR: NEGATIVE
SARS Coronavirus 2 by RT PCR: NEGATIVE

## 2019-11-05 LAB — APTT: aPTT: 41 seconds — ABNORMAL HIGH (ref 24–36)

## 2019-11-05 LAB — PROTIME-INR
INR: 2.4 — ABNORMAL HIGH (ref 0.8–1.2)
Prothrombin Time: 25.7 seconds — ABNORMAL HIGH (ref 11.4–15.2)

## 2019-11-05 LAB — MRSA PCR SCREENING: MRSA by PCR: POSITIVE — AB

## 2019-11-05 LAB — LIPASE, BLOOD: Lipase: 16 U/L (ref 11–51)

## 2019-11-05 LAB — LACTIC ACID, PLASMA
Lactic Acid, Venous: 1.9 mmol/L (ref 0.5–1.9)
Lactic Acid, Venous: 1.6 mmol/L (ref 0.5–1.9)

## 2019-11-05 SURGERY — CYSTOURETEROSCOPY, WITH RETROGRADE PYELOGRAM AND STENT INSERTION
Anesthesia: General | Laterality: Bilateral

## 2019-11-05 MED ORDER — HEPARIN SODIUM (PORCINE) 5000 UNIT/ML IJ SOLN: 5000.0000 [IU] | Freq: Three times a day (TID) | INTRAMUSCULAR | Status: DC

## 2019-11-05 MED ORDER — LACTATED RINGERS IV SOLN: INTRAVENOUS | Status: DC

## 2019-11-05 MED ORDER — LORAZEPAM 2 MG/ML IJ SOLN
0.5000 mg | INTRAMUSCULAR | Status: DC | PRN
  Administered 2019-11-06: 04:00:00 0.5 mg via INTRAVENOUS
  Filled 2019-11-05: qty 1

## 2019-11-05 MED ORDER — LACTATED RINGERS IV BOLUS (SEPSIS): 1000.0000 mL | Freq: Once | INTRAVENOUS | Status: DC

## 2019-11-05 MED ORDER — PROPOFOL 10 MG/ML IV BOLUS
INTRAVENOUS | Status: AC
  Filled 2019-11-05: qty 20

## 2019-11-05 MED ORDER — LEVOTHYROXINE SODIUM 100 MCG/5ML IV SOLN: 50.0000 ug | Freq: Every day | INTRAVENOUS | Status: DC

## 2019-11-05 MED ORDER — ACETAMINOPHEN 325 MG PO TABS: 650.0000 mg | ORAL_TABLET | Freq: Four times a day (QID) | ORAL | Status: DC | PRN

## 2019-11-05 MED ORDER — LACTATED RINGERS IV BOLUS
1000.0000 mL | Freq: Once | INTRAVENOUS | Status: AC
  Administered 2019-11-05: 1000 mL via INTRAVENOUS

## 2019-11-05 MED ORDER — SODIUM CHLORIDE 0.9 % IV SOLN
2.0000 g | Freq: Once | INTRAVENOUS | Status: AC
  Administered 2019-11-05: 2 g via INTRAVENOUS
  Filled 2019-11-05: qty 2

## 2019-11-05 MED ORDER — SODIUM CHLORIDE 0.9 % IV SOLN: 2.0000 g | INTRAVENOUS | Status: DC

## 2019-11-05 MED ORDER — ACETAMINOPHEN 650 MG RE SUPP: 650.0000 mg | Freq: Four times a day (QID) | RECTAL | Status: DC | PRN

## 2019-11-05 MED ORDER — ORAL CARE MOUTH RINSE: 15.0000 mL | Freq: Two times a day (BID) | OROMUCOSAL | Status: DC

## 2019-11-05 MED ORDER — ACETAMINOPHEN 650 MG RE SUPP
650.0000 mg | Freq: Once | RECTAL | Status: AC
  Administered 2019-11-05: 650 mg via RECTAL
  Filled 2019-11-05: qty 1

## 2019-11-05 MED ORDER — ONDANSETRON HCL 4 MG PO TABS: 4.0000 mg | ORAL_TABLET | Freq: Four times a day (QID) | ORAL | Status: DC | PRN

## 2019-11-05 MED ORDER — SODIUM CHLORIDE 0.9 % IV BOLUS (SEPSIS)
1000.0000 mL | Freq: Once | INTRAVENOUS | Status: AC
  Administered 2019-11-05: 1000 mL via INTRAVENOUS

## 2019-11-05 MED ORDER — SODIUM CHLORIDE 0.9 % IV BOLUS (SEPSIS)
500.0000 mL | Freq: Once | INTRAVENOUS | Status: AC
  Administered 2019-11-05: 500 mL via INTRAVENOUS

## 2019-11-05 MED ORDER — DEXAMETHASONE SODIUM PHOSPHATE 10 MG/ML IJ SOLN
INTRAMUSCULAR | Status: AC
  Filled 2019-11-05: qty 1

## 2019-11-05 MED ORDER — ONDANSETRON HCL 4 MG/2ML IJ SOLN
INTRAMUSCULAR | Status: AC
  Filled 2019-11-05: qty 2

## 2019-11-05 MED ORDER — METRONIDAZOLE IN NACL 5-0.79 MG/ML-% IV SOLN
500.0000 mg | Freq: Once | INTRAVENOUS | Status: AC
  Administered 2019-11-05: 500 mg via INTRAVENOUS
  Filled 2019-11-05: qty 100

## 2019-11-05 MED ORDER — SODIUM CHLORIDE 0.9% FLUSH: 3.0000 mL | INTRAVENOUS | Status: DC | PRN

## 2019-11-05 MED ORDER — MORPHINE SULFATE (PF) 2 MG/ML IV SOLN
1.0000 mg | INTRAVENOUS | Status: DC | PRN
  Administered 2019-11-05 – 2019-11-06 (×2): 2 mg via INTRAVENOUS
  Filled 2019-11-05 (×3): qty 1

## 2019-11-05 MED ORDER — CHLORHEXIDINE GLUCONATE CLOTH 2 % EX PADS
6.0000 | MEDICATED_PAD | Freq: Every day | CUTANEOUS | Status: DC
  Administered 2019-11-05: 6 via TOPICAL

## 2019-11-05 MED ORDER — SODIUM CHLORIDE 0.9 % IV SOLN: 250.0000 mL | INTRAVENOUS | Status: DC | PRN

## 2019-11-05 MED ORDER — ONDANSETRON HCL 4 MG/2ML IJ SOLN: 4.0000 mg | Freq: Four times a day (QID) | INTRAMUSCULAR | Status: DC | PRN

## 2019-11-05 MED ORDER — SODIUM CHLORIDE 0.9% FLUSH: 3.0000 mL | Freq: Two times a day (BID) | INTRAVENOUS | Status: DC

## 2019-11-05 MED ORDER — VANCOMYCIN HCL IN DEXTROSE 1-5 GM/200ML-% IV SOLN: 1000.0000 mg | INTRAVENOUS | Status: DC

## 2019-11-05 MED ORDER — FENTANYL CITRATE (PF) 100 MCG/2ML IJ SOLN
INTRAMUSCULAR | Status: AC
  Filled 2019-11-05: qty 2

## 2019-11-05 MED ORDER — LACTATED RINGERS IV BOLUS (SEPSIS): 500.0000 mL | Freq: Once | INTRAVENOUS | Status: DC

## 2019-11-05 MED ORDER — METOPROLOL TARTRATE 5 MG/5ML IV SOLN: 2.5000 mg | INTRAVENOUS | Status: DC | PRN

## 2019-11-05 MED ORDER — LIDOCAINE 2% (20 MG/ML) 5 ML SYRINGE
INTRAMUSCULAR | Status: AC
  Filled 2019-11-05: qty 5

## 2019-11-05 MED ORDER — HYOSCYAMINE SULFATE 0.125 MG SL SUBL
0.1250 mg | SUBLINGUAL_TABLET | SUBLINGUAL | Status: DC | PRN
  Administered 2019-11-06 – 2019-11-08 (×4): 0.125 mg via SUBLINGUAL
  Filled 2019-11-05 (×8): qty 1

## 2019-11-05 MED ORDER — METOPROLOL TARTRATE 5 MG/5ML IV SOLN: 5.0000 mg | INTRAVENOUS | Status: DC | PRN

## 2019-11-05 MED ORDER — VANCOMYCIN HCL IN DEXTROSE 1-5 GM/200ML-% IV SOLN
1000.0000 mg | Freq: Once | INTRAVENOUS | Status: AC
  Administered 2019-11-05: 13:00:00 1000 mg via INTRAVENOUS
  Filled 2019-11-05: qty 200

## 2019-11-05 NOTE — Progress Notes (Addendum)
RN paged at shift change stating pt's BP was 70/31. Mental status same as admission. She has had 2.5 L IVF resuscitation per sepsis protocol. Apparently, family was deciding on surgery tomorrow or comfort care (per urology note). Per RN, pt's son stated pt was having surgery in a.m. Gave 1L LR bolus and BP up to 101/30. However, fell again to 88. Another 1L LR given and ordered BMP, CBC with diff, and LA. WBCC count is trending downward. Hgb has dropped to 6.6 (9ish), but believe this is due to fluids. Recheck again in a few hours. Creat has fallen as well. LA is still normal. Last BP 111/36. Will follow. If BP does not hold, will have to call family to discuss further plans.She is a DNR. KJKG, NP [facility] Update: As NP was finishing above note, RN paged that family was here and want comfort care. NP to bedside. Pt's son and his wife are here. Son states he has spoken to the family and they are all on the same page that comfort care is best. Family states that pt has been suffering with infections for a long time and that she has fought enough. NP reviewed what comfort care means and he still agrees. Discussed end of life care. Family asking for priest for last rites. RN to call chaplain. Appropriate orders placed.  KJKG, NP [facility]

## 2019-11-05 NOTE — Progress Notes (Signed)
Family reported patient grimacing and uncomfortable. See MAR, morphine given as prescribed. Patient's other son, Sapir Sorell, arrived. A short time later, patient awoke, clearly stated she was "freezing cold". Provided warm blankets and adjusted room temperature. She conversed with her family and this RN slightly, smiled, and looked very comfortable. Family at bedside. Will continue to monitor for needs.

## 2019-11-05 NOTE — ED Provider Notes (Signed)
[place] DEPT Provider Note   CSN: [id number] Arrival date & time: 11/09/2019  1218     History Chief Complaint  Patient presents with  . Blood Infection   LEVEL 5 CAVEAT - DEMENTIA  Victoria Walsh is a 86 y.o. female with PMHx HTN, CHF, PAF on Eliquis, CAD, CVA, and CKD who presents to the ED today via EMS from [facility] for Altered Mental Status and fever this morning. Per EMS pt was last seen normal last night. This morning she would not eat which is atypical for her and when they checked her temperature they noted it was elevated. With EMS pt's oral temp 103.5. Rectal 104.3.   Per chart review pt was recently admitted in February (2/24-2/27) for acute metabolic encephalopathy s/2 UTI as well as AKI, mild hypokalemia.   The history is provided by the nursing home, the EMS personnel and medical records.       Past Medical History:  Diagnosis Date  . Chronic kidney disease   . CVA (cerebral infarction)   . Dementia ([place])   . Hyperlipidemia   . Osteopenia   . Stroke ([place])   . UTI (lower urinary tract infection)     Patient Active Problem List   Diagnosis Date Noted  . Sepsis secondary to UTI ([place]) 10/23/2019  . PAF (paroxysmal atrial fibrillation) ([place])   . Atrial fibrillation with RVR ([place])   . Chronic diastolic CHF (congestive heart failure) ([place])   . Coronary artery disease involving native coronary artery of native heart without angina pectoris   . Essential hypertension   . Acute lower UTI 09/18/2019  . Sepsis ([place]) 09/18/2019  . AKI (acute kidney injury) ([place]) 09/18/2019  . Dementia with behavioral disturbance ([place]) 09/18/2019  . Ischemic stroke ([place]) 09/18/2019  . Hypothyroidism 09/18/2019  . Breast cancer ([place]) 09/18/2019    Past Surgical History:  Procedure Laterality Date  . ABDOMINAL HYSTERECTOMY    . BREAST LUMPECTOMY    . REPLACEMENT TOTAL KNEE    . TOTAL HIP ARTHROPLASTY       OB History     No obstetric history on file.     History reviewed. No pertinent family history.  Social History   Tobacco Use  . Smoking status: Never Smoker  . Smokeless tobacco: Never Used  Substance Use Topics  . Alcohol use: Yes    Comment: socially: Once every month  . Drug use: No    Home Medications Prior to Admission medications   Medication Sig Start Date End Date Taking? Authorizing Provider  acetaminophen (TYLENOL) 325 MG tablet Take 650 mg by mouth every 6 (six) hours as needed for moderate pain.   Yes [provider]  apixaban (ELIQUIS) 5 MG TABS tablet Take 1 tablet (5 mg total) by mouth 2 (two) times daily. 09/25/19 11/24/19 Yes Kayleen Memos, DO  atorvastatin (LIPITOR) 20 MG tablet Take 1 tablet (20 mg total) by mouth daily at 6 PM. 09/25/19 11/03/2019 Yes Hall, Carole N, DO  brimonidine-timolol (COMBIGAN) 0.2-0.5 % ophthalmic solution Place 1 drop into the left eye every 12 (twelve) hours.   Yes [provider]  brinzolamide (AZOPT) 1 % ophthalmic suspension Place 1 drop into the left eye 2 (two) times daily.   Yes [provider]  Cranberry 450 MG CAPS Take 450 mg by mouth 2 (two) times daily.   Yes [provider]  diltiazem (CARDIZEM CD) 180 MG 24 hr capsule Take 1 capsule (180  mg total) by mouth daily. 09/26/19 10/17/2019 Yes Kayleen Memos, DO  docusate sodium (COLACE) 100 MG capsule Take 100 mg by mouth daily.   Yes [provider]  DULoxetine (CYMBALTA) 20 MG capsule Take 20 mg by mouth at bedtime.    Yes [provider]  Ensure Plus (ENSURE PLUS) LIQD Take 237 mLs by mouth daily.   Yes [provider]  latanoprost (XALATAN) 0.005 % ophthalmic solution Place 1 drop into the left eye at bedtime.   Yes [provider]  letrozole (FEMARA) 2.5 MG tablet Take 2.5 mg by mouth daily.   Yes [provider]  levothyroxine (SYNTHROID, LEVOTHROID) 100 MCG tablet Take 100 mcg by mouth daily before breakfast.    Yes  [provider]  Melatonin 3 MG TABS Take 3 mg by mouth at bedtime.   Yes [provider]  Menthol-Zinc Oxide (CALMOSEPTINE) 0.44-20.6 % OINT Apply 1 application topically 2 (two) times daily. Apply to buttocks for rednedd   Yes [provider]  metoprolol tartrate (LOPRESSOR) 25 MG tablet Take 0.5 tablets (12.5 mg total) by mouth 2 (two) times daily. 09/25/19 11/01/2019 Yes Kayleen Memos, DO  Multiple Vitamins-Iron (MULTIVITAMINS WITH IRON) TABS tablet Take 1 tablet by mouth at bedtime.   Yes [provider]  pantoprazole (PROTONIX) 40 MG tablet Take 1 tablet (40 mg total) by mouth daily. 09/26/19 11/07/2019 Yes Kayleen Memos, DO  pregabalin (LYRICA) 25 MG capsule Take 25 mg by mouth at bedtime.   Yes [provider]  Psyllium 500 MG CAPS Take 500 mg by mouth at bedtime.    Yes [provider]  senna (SENOKOT) 8.6 MG TABS tablet Take 1 tablet by mouth See admin instructions. Monday, Wednesday and Friday in the evening   Yes [provider]    Allergies    Ciprofloxacin, Oxaprozin, and Sulfa antibiotics  Review of Systems   Review of Systems  Unable to perform ROS: Dementia  Constitutional: Positive for fever.    Physical Exam Updated Vital Signs BP (!) 113/57   Pulse 106   Temp 104.3 (Rectal)   Resp (!) 23  SpO2 95%   Physical Exam Vitals and nursing note reviewed.  Constitutional:      Appearance: She is not ill-appearing or diaphoretic.  HENT:     Head: Normocephalic and atraumatic.     Mouth/Throat:     Mouth: Mucous membranes are dry.  Eyes:     Extraocular Movements: Extraocular movements intact.     Conjunctiva/sclera: Conjunctivae normal.     Pupils: Pupils are equal, round, and reactive to light.  Cardiovascular:     Rate and Rhythm: Normal rate and regular rhythm.  Pulmonary:     Effort: Pulmonary effort is normal.     Breath sounds: Normal breath sounds. No wheezing, rhonchi or rales.  Abdominal:      Palpations: Abdomen is soft.     Tenderness: There is abdominal tenderness. There is no guarding or rebound.     Comments: Soft, + diffuse abdominal tenderness to palpation, +BS throughout, no r/g/r, neg murphy's, neg mcburney's  Musculoskeletal:     Cervical back: Neck supple.  Skin:    General: Skin is warm and dry.  Neurological:     Mental Status: She is alert.     Comments: Opens eyes to verbal stimuli. Responds to pain. GCS 12     ED Results / Procedures / Treatments   Labs (all labs ordered are listed, but  only abnormal results are displayed) Labs Reviewed  COMPREHENSIVE METABOLIC PANEL - Abnormal; Notable for the following components:      Result Value   Potassium 3.4 (*)    CO2 21 (*)    Glucose, Bld 116 (*)    BUN 48 (*)    Creatinine, Ser 3.03 (*)    Calcium 8.7 (*)    Albumin 3.4 (*)    GFR calc non Af Amer 13 (*)    GFR calc Af Amer 16 (*)    Anion gap 17 (*)    All other components within normal limits  CBC WITH DIFFERENTIAL/PLATELET - Abnormal; Notable for the following components:   WBC 20.8 (*)    RBC 3.04 (*)    Hemoglobin 9.3 (*)    HCT 32.3 (*)    MCV 106.3 (*)    MCHC 28.8 (*)    RDW 15.7 (*)    Neutro Abs 19.2 (*)    Lymphs Abs 0.5 (*)    Abs Immature Granulocytes 0.18 (*)    All other components within normal limits  APTT - Abnormal; Notable for the following components:   aPTT 41 (*)    All other components within normal limits  PROTIME-INR - Abnormal; Notable for the following components:   Prothrombin Time 25.7 (*)    INR 2.4 (*)    All other components within normal limits  URINALYSIS, ROUTINE W REFLEX MICROSCOPIC - Abnormal; Notable for the following components:   Color, Urine YELLOW (*)    APPearance TURBID (*)    Hgb urine dipstick LARGE (*)    Ketones, ur 5 (*)    Protein, ur 100 (*)    Nitrite POSITIVE (*)    Leukocytes,Ua MODERATE (*)    RBC / HPF >50 (*)    WBC, UA >50 (*)    Bacteria, UA MANY (*)    All other components  within normal limits  CULTURE, BLOOD (ROUTINE X 2)  CULTURE, BLOOD (ROUTINE X 2)  URINE CULTURE  RESPIRATORY PANEL BY RT PCR (FLU A&B, COVID)  LACTIC ACID, PLASMA  LIPASE, BLOOD    EKG None  Radiology CT Abdomen Pelvis Wo Contrast  Result Date: 10/21/2019 CLINICAL DATA:  Abdominal pain fever. EXAM: CT ABDOMEN AND PELVIS WITHOUT CONTRAST TECHNIQUE: Multidetector CT imaging of the abdomen and pelvis was performed following the standard protocol without IV contrast. COMPARISON:  10/10/2019 FINDINGS: Lower chest: Small right pleural effusion versus thickening. Unchanged appearance of 4 mm right middle lobe lung nodule. Hepatobiliary: The 8 mm low-attenuation structure in lateral segment of left lobe of liver is unchanged and too small to reliably characterize gallbladder unremarkable. No biliary dilatation. Pancreas: Fatty replacement of the pancreas. No main duct dilatation, inflammation or mass. Spleen: Normal in size without focal abnormality. Adrenals/Urinary Tract: Normal appearance of the adrenal glands. There is a large cyst within upper pole of right kidney measuring 6.3 cm. Incompletely characterized without IV contrast. Similarly 2.3 cm cyst within upper pole of left kidney is also incompletely characterized without IV contrast. New right-sided hydronephrosis. Previously noted stone within the right renal pelvis has migrated to the right UPJ. This has a long axis dimension of 1.3 cm, image 39/8. Poorly defined calcifications within the left renal collecting system again noted without hydronephrosis. The distal left ureter is increased in caliber containing multiple (approximately 5 stones measuring up to 7 mm in long axis, image 50/8. The bladder is collapsed around a Foley catheter. Calcification within the right-side of the bladder  measures 3 mm. Stomach/Bowel: Stomach is within normal limits. Appendix appears normal. No evidence of bowel wall thickening, distention, or inflammatory changes.  Vascular/Lymphatic: Aortic atherosclerosis. No aneurysm. No abdominopelvic adenopathy. Reproductive: Status post hysterectomy. Right adnexal mass measures 7.0 x 5.5 cm, image 72/3. Unchanged in size from previous exam. Other: No ascites or fluid collections within the abdomen or pelvis. Musculoskeletal: Previous ORIF left femur. Mild scoliosis and degenerative disc disease. IMPRESSION: 1. Interval migration of right renal pelvis stone to the right UPJ. There is new right-sided hydronephrosis. 2. Increase caliber of the distal left ureter which now contains multiple stones measuring up to 7 mm in long axis. 3. Stable right adnexal cyst mass. This would be better assessed with pelvic sonogram. 4. Small right pleural effusion versus thickening. 5. Bilateral kidney cysts. Incompletely characterized without IV contrast. 6. Small right-side of bladder calculus measuring 3 mm. 7. Stable low-attenuation structure within lateral segment of left lobe of liver is too small to reliably characterize but favored to represent a benign abnormality. 8. Stable appearance of 4 mm right middle lobe lung nodule. This is favored to represent a benign abnormality. No follow-up needed if patient is low-risk. Non-contrast chest CT can be considered in 12 months if patient is high-risk. This recommendation follows the consensus statement: Guidelines for Management of Incidental Pulmonary Nodules Detected on CT Images: From the Fleischner Society 2017; Radiology 2017; 284:228-243. Aortic Atherosclerosis (ICD10-I70.0). Electronically Signed   By: Kerby Moors M.D.   On: 10/15/2019 14:22   CT Head Wo Contrast  Result Date: 11/13/2019 CLINICAL DATA:  Altered mental status. EXAM: CT HEAD WITHOUT CONTRAST TECHNIQUE: Contiguous axial images were obtained from the base of the skull through the vertex without intravenous contrast. COMPARISON:  April 26, 2019. FINDINGS: Brain: Mild diffuse cortical atrophy is noted. Mild chronic ischemic  white matter disease is noted. Right parietal encephalomalacia is noted consistent with old infarction. Old right occipital infarction is noted. No mass effect or midline shift is noted. Ventricular size is within normal limits. There is no evidence of mass lesion, hemorrhage or acute infarction. Vascular: No hyperdense vessel or unexpected calcification. Skull: Normal. Negative for fracture or focal lesion. Sinuses/Orbits: No acute finding. Other: None. IMPRESSION: Mild diffuse cortical atrophy. Mild chronic ischemic white matter disease. 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 No acute intracranial abnormality seen. Electronically Signed   By: Marijo Conception M.D.   On: 10/28/2019 14:17   DG Chest Port 1 View  Result Date: 11/14/2019 CLINICAL DATA:  Rule out infection. Additional history provided: Patient coming from the EMS for sepsis, altered this morning, temperature 103.5 EXAM: PORTABLE CHEST 1 VIEW COMPARISON:  Chest radiograph 09/21/2019 FINDINGS: Heart size at the upper limits of normal. No evidence of airspace consolidation within the lungs. No evidence of pleural effusion or pneumothorax. No acute bony abnormality. IMPRESSION: No evidence of acute cardiopulmonary abnormality. Heart size at the upper limits of normal. Electronically Signed   By: Kellie Simmering DO   On: 10/20/2019 13:36    Procedures .Critical Care Performed by: Eustaquio Maize, PA-C Authorized by: Eustaquio Maize, PA-C   Critical care provider statement:    Critical care time (minutes):  60   Critical care was necessary to treat or prevent imminent or life-threatening deterioration of the following conditions:  Sepsis, renal failure, metabolic crisis and shock   Critical care was time spent personally by me on the following activities:  Discussions with consultants, evaluation of patient's response to treatment, examination of patient, ordering  and performing treatments and interventions, ordering and review of  laboratory studies, ordering and review of radiographic studies, pulse oximetry, re-evaluation of patient's condition, obtaining history from patient or surrogate and review of old charts   (including critical care time)  Medications Ordered in ED Medications  acetaminophen (TYLENOL) suppository 650 mg (650 mg Rectal Given 11/02/2019 1251)  sodium chloride 0.9 % bolus 1,000 mL (0 mLs Intravenous Stopped 11/04/2019 1438)  ceFEPIme (MAXIPIME) 2 g in sodium chloride 0.9 % 100 mL IVPB (0 g Intravenous Stopped 11/10/2019 1336)  metroNIDAZOLE (FLAGYL) IVPB 500 mg (0 mg Intravenous Stopped 10/22/2019 1421)  vancomycin (VANCOCIN) IVPB 1000 mg/200 mL premix (0 mg Intravenous Stopped 11/11/2019 1416)  sodium chloride 0.9 % bolus 1,000 mL (1,000 mLs Intravenous New Bag/Given 11/07/2019 1417)    And  sodium chloride 0.9 % bolus 500 mL (500 mLs Intravenous New Bag/Given 10/19/2019 1419)    ED Course  I have reviewed the triage vital signs and the nursing notes.  Pertinent labs & imaging results that were available during my care of the patient were reviewed by me and considered in my medical decision making (see chart for details).  Clinical Course as of Nov 04 1620  Mon Mar 22, 280  3556 86 year old female from [facility] for fever and change in mental status.  History of sepsis and recent admission for urinary tract infection.  Chronic Foley.  Patient has not answering any questions, febrile and tachycardic here.  Getting labs fluids antibiotics.  Will need admission.   [MB]    Clinical Course User Index [MB] Hayden Rasmussen, MD   MDM Rules/Calculators/A&P                      86 year old female who presented to the ED via SNF memory care unit for fever and altered mental status that began today.  On arrival to the ED patient's rectal temp 104.3.  He admitted at the end of February for metabolic encephalopathy secondary to UTI.  Will start on empiric antibiotics for unknown source and work-up for infection  today.  On exam patient does respond with pain to her abdomen.  Will obtain CT scan of the abdomen as well as CT head given altered mental status.  X-ray and UA also obtained.   CBC with no leukocytosis of 20,800.  Hemoglobin 9.3 which appears to be patient's baseline.  CMP with potassium 3.4, bicarb 21, creatinine 3.03 with GFR 13. Gap of 17. Suspect dehydration as source. Glucose 116. Already receiving 1 L IV fluids. Will add on additional units for 30 CC/kg given WBC  Lab Results  Component Value Date   CREATININE 3.03 (H) 10/31/2019   CREATININE 1.08 (H) 10/13/2019   CREATININE 1.18 (H) 10/12/2019   Lipase 16 Lactic acid 1.9  CXR without acute findings U/A + nitrites, + leuks, > 50 WBCs, > 50 RBCs  CT head without acute findings CT A/P without contrast given worsening renal function does show migrating stone on the right which was seen on CT scan during last admission. Given this in the setting of UTI will consult urology for further reccs.   Blood pressure continues to be soft in the [id number] systolic. Discussed case with attending physician Dr. Melina Copa who recommends reevaluated after pt has received full sepsis fluids.   Discussed case with Dr. Jeffie Pollock with urology who recommends OR for stenting. Will discuss with family given DNR status.   Family is currently deciding on whether they want patient  to go to the OR vs other options. Have securely messaged Dr. Jeffie Pollock who is up to date that family is still deciding. He will come evaluate patient and speak with family regarding options. 6-24 hour covid test changed to 2 hour in case pt needs to go to the OR.  Discussed case with Dr. Wynelle Cleveland with [facility] who will come evaluate patient for admission.    Final Clinical Impression(s) / ED Diagnoses Final diagnoses:  Acute metabolic encephalopathy  Sepsis with acute renal failure, due to unspecified organism, unspecified acute renal failure type, unspecified whether septic shock present  ([place])  AKI (acute kidney injury) [facility])    Rx / [place] Orders ED Discharge Orders    None       Eustaquio Maize, PA-C 11/04/2019 1622    Hayden Rasmussen, MD 11/03/2019 [phone]

## 2019-11-05 NOTE — Anesthesia Preprocedure Evaluation (Addendum)
Anesthesia Evaluation  Patient identified by MRN, date of birth, ID band Patient confused    Reviewed: Allergy & Precautions, NPO status , Patient's Chart, lab work & pertinent test results, reviewed documented beta blocker date and time   Airway Mallampati: II  TM Distance: >3 FB Neck ROM: Full    Dental no notable dental hx.    Pulmonary neg pulmonary ROS,    Pulmonary exam normal breath sounds clear to auscultation       Cardiovascular hypertension, Pt. on home beta blockers and Pt. on medications + CAD  Normal cardiovascular exam Rhythm:Regular Rate:Normal     Neuro/Psych PSYCHIATRIC DISORDERS Dementia CVA    GI/Hepatic Neg liver ROS, GERD  Medicated,  Endo/Other  Hypothyroidism   Renal/GU ARFRenal disease  negative genitourinary   Musculoskeletal negative musculoskeletal ROS (+)   Abdominal   Peds negative pediatric ROS (+)  Hematology negative hematology ROS (+) anemia ,   Anesthesia Other Findings IMPRESSIONS    1. Left ventricular ejection fraction, by visual estimation, is 55 to  60%. The left ventricle has normal function. There is no left ventricular  hypertrophy.  2. Left ventricular diastolic parameters are indeterminate due to atrial  fibrillation.  3. The left ventricle has no regional wall motion abnormalities.  4. Global right ventricle has normal systolic function.The right  ventricular size is normal. No increase in right ventricular wall  thickness.  5. Left atrial size was normal.  6. Right atrial size was normal.  7. Mild mitral annular calcification.  8. The mitral valve is normal in structure. Mild mitral valve  regurgitation. No evidence of mitral stenosis.  9. The tricuspid valve is normal in structure. Tricuspid valve  regurgitation is not demonstrated.  10. The aortic valve is normal in structure. Aortic valve regurgitation is  not visualized. Mild to moderate aortic  valve stenosis.  11. The pulmonic valve was normal in structure. Pulmonic valve  regurgitation is not visualized.  12. Normal pulmonary artery systolic pressure.  13. The inferior vena cava is normal in size with greater than 50%  respiratory variability, suggesting right atrial pressure of 3 mmHg.   FINDINGS   Reproductive/Obstetrics negative OB ROS                            Anesthesia Physical Anesthesia Plan  ASA: IV  Anesthesia Plan: General   Post-op Pain Management:    Induction: Intravenous  PONV Risk Score and Plan: 4 or greater and Ondansetron  Airway Management Planned: LMA  Additional Equipment: None  Intra-op Plan:   Post-operative Plan: Extubation in OR  Informed Consent: I have reviewed the patients History and Physical, chart, labs and discussed the procedure including the risks, benefits and alternatives for the proposed anesthesia with the patient or authorized representative who has indicated his/her understanding and acceptance.     Dental advisory given  Plan Discussed with: CRNA and Surgeon  Anesthesia Plan Comments:        Anesthesia Quick Evaluation

## 2019-11-05 NOTE — Progress Notes (Signed)
Family [name]) has chosen to make patient comfort care. Forrest Moron at bedside to facilitate this process.

## 2019-11-05 NOTE — H&P (Addendum)
History and Physical    Victoria Walsh  [id number]  DOB: Apr 23, 1932  DOA: 11/03/2019 PCP: Reymundo Poll, MD   Patient coming from: ALF  Chief Complaint: sent for fever/ confusion  HPI: Victoria Walsh is a 86 y.o. female with medical history of dementia, CKD, CVA, UTIs, chronic foley who is sent from ALF/memory care for fever and worsening confusion. She is unable to give me a history as she is quite lethargic.  ED Course: 104.3 RR 20-30, HR 100's, BP 85/43, WBC 20.8, BUN 48/ Cr 3.03  CT abdomen>  1.Interval migration of right renal pelvis stone to the right UPJ. There is new right-sided hydronephrosis. 2. Increase caliber of the distal left ureter which now contains multiple stones measuring up to 7 mm in long axis. 3. Stable right adnexal cyst mass. This would be better assessed with pelvic sonogram.  Review of Systems:  Unable to obtain due to lethargy  Past Medical History:  Diagnosis Date  . Chronic kidney disease   . CVA (cerebral infarction)   . Dementia ([place])   . Hyperlipidemia   . Osteopenia   . Stroke ([place])   . UTI (lower urinary tract infection)     Past Surgical History:  Procedure Laterality Date  . ABDOMINAL HYSTERECTOMY    . BREAST LUMPECTOMY    . REPLACEMENT TOTAL KNEE    . TOTAL HIP ARTHROPLASTY      Social History:   reports that she has never smoked. She has never used smokeless tobacco. She reports current alcohol use. She reports that she does not use drugs.  Allergies  Allergen Reactions  . Ciprofloxacin Other (See Comments)    On MAR  . Oxaprozin Other (See Comments)    DAYPRO (SULFA DRUG) On MAR  . Sulfa Antibiotics Other (See Comments)    On MAR    Family history: unable to obtain  Prior to Admission medications   Medication Sig Start Date End Date Taking? Authorizing Provider  acetaminophen (TYLENOL) 325 MG tablet Take 650 mg by mouth every 6 (six) hours as needed for moderate pain.   Yes [provider]  apixaban  (ELIQUIS) 5 MG TABS tablet Take 1 tablet (5 mg total) by mouth 2 (two) times daily. 09/25/19 11/24/19 Yes Kayleen Memos, DO  atorvastatin (LIPITOR) 20 MG tablet Take 1 tablet (20 mg total) by mouth daily at 6 PM. 09/25/19 11/03/2019 Yes Hall, Carole N, DO  brimonidine-timolol (COMBIGAN) 0.2-0.5 % ophthalmic solution Place 1 drop into the left eye every 12 (twelve) hours.   Yes [provider]  brinzolamide (AZOPT) 1 % ophthalmic suspension Place 1 drop into the left eye 2 (two) times daily.   Yes [provider]  Cranberry 450 MG CAPS Take 450 mg by mouth 2 (two) times daily.   Yes [provider]  diltiazem (CARDIZEM CD) 180 MG 24 hr capsule Take 1 capsule (180 mg total) by mouth daily. 09/26/19 10/31/2019 Yes Kayleen Memos, DO  docusate sodium (COLACE) 100 MG capsule Take 100 mg by mouth daily.   Yes [provider]  DULoxetine (CYMBALTA) 20 MG capsule Take 20 mg by mouth at bedtime.    Yes [provider]  Ensure Plus (ENSURE PLUS) LIQD Take 237 mLs by mouth daily.   Yes [provider]  latanoprost (XALATAN) 0.005 % ophthalmic solution Place 1 drop into the left eye at bedtime.   Yes [provider]  letrozole (FEMARA) 2.5 MG tablet Take 2.5 mg by mouth daily.  Yes [provider]  levothyroxine (SYNTHROID, LEVOTHROID) 100 MCG tablet Take 100 mcg by mouth daily before breakfast.    Yes [provider]  Melatonin 3 MG TABS Take 3 mg by mouth at bedtime.   Yes [provider]  Menthol-Zinc Oxide (CALMOSEPTINE) 0.44-20.6 % OINT Apply 1 application topically 2 (two) times daily. Apply to buttocks for rednedd   Yes [provider]  metoprolol tartrate (LOPRESSOR) 25 MG tablet Take 0.5 tablets (12.5 mg total) by mouth 2 (two) times daily. 09/25/19 11/14/2019 Yes Kayleen Memos, DO  Multiple Vitamins-Iron (MULTIVITAMINS WITH IRON) TABS tablet Take 1 tablet by mouth at bedtime.   Yes [provider]    pantoprazole (PROTONIX) 40 MG tablet Take 1 tablet (40 mg total) by mouth daily. 09/26/19 10/28/2019 Yes Kayleen Memos, DO  pregabalin (LYRICA) 25 MG capsule Take 25 mg by mouth at bedtime.   Yes [provider]  Psyllium 500 MG CAPS Take 500 mg by mouth at bedtime.    Yes [provider]  senna (SENOKOT) 8.6 MG TABS tablet Take 1 tablet by mouth See admin instructions. Monday, Wednesday and Friday in the evening   Yes [provider]    Physical Exam: Wt Readings from Last 3 Encounters:  10/13/19 80.8 kg  09/30/19 82.7 kg  09/21/19 82.7 kg   Vitals:   11/12/2019 1545 11/04/2019 1600 11/13/2019 1630 10/15/2019 1645  BP: (!) 96/45 (!) 83/41 (!) 86/43 (!) 96/52  Pulse: 91 87 76   Resp: 17 16 17 16   Temp:      TempSrc:      SpO2: 97% 96% 97%       Constitutional:  Calm & comfortable- responds to pain Eyes: PERRLA, lids and conjunctivae normal ENT:  Unable to assess oral mucosa Neck: Supple, no masses  Respiratory:  Clear to auscultation bilaterally  Normal respiratory effort.  Cardiovascular:  S1 & S2 heard, regular rate and rhythm- tachycardic No Murmurs Abdomen:  Non distended No tenderness, No masses Bowel sounds normal Extremities:  No clubbing / cyanosis No pedal edema No joint deformity    Skin:  No rashes, lesions or ulcers Neurologic:  Lethargic, extremities flaccid Psychiatric:  Unable to assess    Labs on Admission: I have personally reviewed following labs and imaging studies  CBC: Recent Labs  Lab 11/10/2019 1239  WBC 20.8*  NEUTROABS 19.2*  HGB 9.3*  HCT 32.3*  MCV 106.3*  PLT 0000000   Basic Metabolic Panel: Recent Labs  Lab 11/01/2019 1239  NA 145  K 3.4*  CL 107  CO2 21*  GLUCOSE 116*  BUN 48*  CREATININE 3.03*  CALCIUM 8.7*   GFR: CrCl cannot be calculated (Unknown ideal weight.). Liver Function Tests: Recent Labs  Lab 10/31/2019 1239  AST 28  ALT 17  ALKPHOS 77  BILITOT 0.8  PROT 6.6  ALBUMIN 3.4*    Recent Labs  Lab 10/24/2019 1239  LIPASE 16   No results for input(s): AMMONIA in the last 168 hours. Coagulation Profile: Recent Labs  Lab 10/18/2019 1239  INR 2.4*   Cardiac Enzymes: No results for input(s): CKTOTAL, CKMB, CKMBINDEX, TROPONINI in the last 168 hours. BNP (last 3 results) No results for input(s): PROBNP in the last 8760 hours. HbA1C: No results for input(s): HGBA1C in the last 72 hours. CBG: No results for input(s): GLUCAP in the last 168 hours. Lipid Profile: No results for input(s): CHOL, HDL, LDLCALC, TRIG, CHOLHDL, LDLDIRECT in the last 72 hours. Thyroid  Function Tests: No results for input(s): TSH, T4TOTAL, FREET4, T3FREE, THYROIDAB in the last 72 hours. Anemia Panel: No results for input(s): VITAMINB12, FOLATE, FERRITIN, TIBC, IRON, RETICCTPCT in the last 72 hours. Urine analysis:    Component Value Date/Time   COLORURINE YELLOW (A) 10/24/2019 1239   APPEARANCEUR TURBID (A) 11/14/2019 1239   LABSPEC 1.016 10/19/2019 1239   PHURINE 8.0 11/12/2019 1239   GLUCOSEU NEGATIVE 10/26/2019 1239   HGBUR LARGE (A) 10/17/2019 1239   BILIRUBINUR NEGATIVE 11/06/2019 1239   KETONESUR 5 (A) 10/15/2019 1239   PROTEINUR 100 (A) 10/17/2019 1239   NITRITE POSITIVE (A) 11/12/2019 1239   LEUKOCYTESUR MODERATE (A) 10/27/2019 1239   Sepsis Labs: @LABRCNTIP (procalcitonin:4,lacticidven:4) )No results found for this or any previous visit (from the past 240 hour(s)).   Radiological Exams on Admission: CT Abdomen Pelvis Wo Contrast  Result Date: 10/20/2019 CLINICAL DATA:  Abdominal pain fever. EXAM: CT ABDOMEN AND PELVIS WITHOUT CONTRAST TECHNIQUE: Multidetector CT imaging of the abdomen and pelvis was performed following the standard protocol without IV contrast. COMPARISON:  10/10/2019 FINDINGS: Lower chest: Small right pleural effusion versus thickening. Unchanged appearance of 4 mm right middle lobe lung nodule. Hepatobiliary: The 8 mm low-attenuation structure in  lateral segment of left lobe of liver is unchanged and too small to reliably characterize gallbladder unremarkable. No biliary dilatation. Pancreas: Fatty replacement of the pancreas. No main duct dilatation, inflammation or mass. Spleen: Normal in size without focal abnormality. Adrenals/Urinary Tract: Normal appearance of the adrenal glands. There is a large cyst within upper pole of right kidney measuring 6.3 cm. Incompletely characterized without IV contrast. Similarly 2.3 cm cyst within upper pole of left kidney is also incompletely characterized without IV contrast. New right-sided hydronephrosis. Previously noted stone within the right renal pelvis has migrated to the right UPJ. This has a long axis dimension of 1.3 cm, image 39/8. Poorly defined calcifications within the left renal collecting system again noted without hydronephrosis. The distal left ureter is increased in caliber containing multiple (approximately 5 stones measuring up to 7 mm in long axis, image 50/8. The bladder is collapsed around a Foley catheter. Calcification within the right-side of the bladder measures 3 mm. Stomach/Bowel: Stomach is within normal limits. Appendix appears normal. No evidence of bowel wall thickening, distention, or inflammatory changes. Vascular/Lymphatic: Aortic atherosclerosis. No aneurysm. No abdominopelvic adenopathy. Reproductive: Status post hysterectomy. Right adnexal mass measures 7.0 x 5.5 cm, image 72/3. Unchanged in size from previous exam. Other: No ascites or fluid collections within the abdomen or pelvis. Musculoskeletal: Previous ORIF left femur. Mild scoliosis and degenerative disc disease. IMPRESSION: 1. Interval migration of right renal pelvis stone to the right UPJ. There is new right-sided hydronephrosis. 2. Increase caliber of the distal left ureter which now contains multiple stones measuring up to 7 mm in long axis. 3. Stable right adnexal cyst mass. This would be better assessed with pelvic  sonogram. 4. Small right pleural effusion versus thickening. 5. Bilateral kidney cysts. Incompletely characterized without IV contrast. 6. Small right-side of bladder calculus measuring 3 mm. 7. Stable low-attenuation structure within lateral segment of left lobe of liver is too small to reliably characterize but favored to represent a benign abnormality. 8. Stable appearance of 4 mm right middle lobe lung nodule. This is favored to represent a benign abnormality. No follow-up needed if patient is low-risk. Non-contrast chest CT can be considered in 12 months if patient is high-risk. This recommendation follows the consensus statement: Guidelines for Management of Incidental Pulmonary Nodules  Detected on CT Images: From the Fleischner Society 2017; Radiology 2017; [phone]. Aortic Atherosclerosis (ICD10-I70.0). Electronically Signed   By: Kerby Moors M.D.   On: 10/20/2019 14:22   CT Head Wo Contrast  Result Date: 11/06/2019 CLINICAL DATA:  Altered mental status. EXAM: CT HEAD WITHOUT CONTRAST TECHNIQUE: Contiguous axial images were obtained from the base of the skull through the vertex without intravenous contrast. COMPARISON:  April 26, 2019. FINDINGS: Brain: Mild diffuse cortical atrophy is noted. Mild chronic ischemic white matter disease is noted. Right parietal encephalomalacia is noted consistent with old infarction. Old right occipital infarction is noted. No mass effect or midline shift is noted. Ventricular size is within normal limits. There is no evidence of mass lesion, hemorrhage or acute infarction. Vascular: No hyperdense vessel or unexpected calcification. Skull: Normal. Negative for fracture or focal lesion. Sinuses/Orbits: No acute finding. Other: None. IMPRESSION: Mild diffuse cortical atrophy. Mild chronic ischemic white matter disease. 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 No acute intracranial abnormality seen. Electronically Signed   By: Marijo Conception M.D.   On:  10/18/2019 14:17   DG Chest Port 1 View  Result Date: 10/30/2019 CLINICAL DATA:  Rule out infection. Additional history provided: Patient coming from the EMS for sepsis, altered this morning, temperature 103.5 EXAM: PORTABLE CHEST 1 VIEW COMPARISON:  Chest radiograph 09/21/2019 FINDINGS: Heart size at the upper limits of normal. No evidence of airspace consolidation within the lungs. No evidence of pleural effusion or pneumothorax. No acute bony abnormality. IMPRESSION: No evidence of acute cardiopulmonary abnormality. Heart size at the upper limits of normal. Electronically Signed   By: Kellie Simmering DO   On: 10/16/2019 13:36     Assessment/Plan Principal Problem:   Severe sepsis secondary to UTI with AKI - related to chronic foley - SBP in 80s- follow- Lactic acid still normal - Vanc/ Flagyl and Ceftriaxone given in ED - the patient was admitted in Feb for a similar picture- she grew K pneumoniae and P mirabilis - I will continue Vanc and place her on Cefepime instead of Ceftriaxone as she may have developed resistant organisms- we can narrow once we have culture results - blood cultures ordered  Active Problems: Nephrolithiasis and right hydronephrosis - ED has already consulted urology who will speak with the patient's family regarding treatment    AKI (acute kidney injury)- CKD3 - related to above sepsis and nephrolithasis - CR 3.03- baseline is ~ 1.08  - she has receive a number of IVF boluses in the ED- cont slow IVF as she is too lethargic to eat/drink    Acute metabolic encephalopathy - with severe lethargy related to above- too lethargic to eat/ drink/ take oral meds at this point     Dementia with behavioral disturbance  - lives in memory care    Hypothyroidism - Change Synthroid from 100 mcg oral to 50 mcg IV    Chronic diastolic CHF (congestive heart failure) - not on diuretics at baseline    Essential hypertension   PAF (paroxysmal atrial fibrillation)   - will  need to hold Cardizem and Metoprolol due to hypotension and she will run the risk of developing RVR - BP too low to allow for any rate control medications for now - I can add PRN Lopressor for A-fib with RVR granted the SBP is > 100 - will need to hold Eliquis as well until she is alert which may be tomorrow AM- if she does not become alert, can consider Heparin infusion  or full dose Lovenox - CHA2DS2-VASc Score at least 5  Anemia - follow   DVT prophylaxis: Lovenox  Code Status: DNR per son Victoria Walsh (was DNR on last admission as well) Family Communication: I spoke with her son, Victoria Walsh over the phone Disposition Plan: from ALF- may need SNF  Consults called: none  Admission status: inpatient    Debbe Odea MD [facility] Pager: [email or web address] Password [email or web address] 7PM-7AM, please contact night-coverage   11/12/2019, 4:56 PM

## 2019-11-05 NOTE — Progress Notes (Signed)
A consult was received from an ED physician for cefepime and vancomycin per pharmacy dosing.  The patient's profile has been reviewed for ht/wt/allergies/indication/available labs.   A one time order has been placed for cefepime 2 gr IV x1 and vancomycin 1000 mg IV x1 .  Further antibiotics/pharmacy consults should be ordered by admitting physician if indicated.                       Thank you,    Royetta Asal, PharmD, BCPS 10/26/2019 1:20 PM

## 2019-11-05 NOTE — Progress Notes (Signed)
Pharmacy Antibiotic Note  Felita Branon is a 86 y.o. female admitted on 10/20/2019 with sepsis.   She has chronic foley and was hospitalized ~ 1 month ago with pan-sensitive UTI and AKI.  Pharmacy has been consulted for Vancomycin & Cefepime dosing.  Tm 104.F, WBC elevated 20.8, Scr elevated from baseline 3.03 (est CrCl ~ 3ml/min)  Plan: Cefepime 2gm IV q24h Vancomycin 1gm IV q48h to target AUC 400-550 Monitor renal function and cx data      Temp (24hrs), Avg:101.9 F (38.8 C), Min:99.4 F (37.4 C), Max:104.3 F (40.2 C)  Recent Labs  Lab 11/09/2019 1239  WBC 20.8*  CREATININE 3.03*  LATICACIDVEN 1.9    CrCl cannot be calculated (Unknown ideal weight.).    Allergies  Allergen Reactions  . Ciprofloxacin Other (See Comments)    On MAR  . Oxaprozin Other (See Comments)    DAYPRO (SULFA DRUG) On MAR  . Sulfa Antibiotics Other (See Comments)    On MAR    Antimicrobials this admission: 3/22 Vanc >>  3/22 Cefepime >>  3/22 Flagyl>>  Dose adjustments this admission:  Microbiology results: 3/22 BCx:  3/22 UCx:    Thank you for allowing pharmacy to be a part of this patient's care.  Netta Cedars PharmD, BCPS 11/13/2019 5:18 PM

## 2019-11-05 NOTE — ED Triage Notes (Signed)
Per EMS: Patient coming in by EMS for Sepsis. Patient was found altered this morning at 0800 and has been altered since with fever. Temp 103.5 with EMS. Patient is confused at baseline but is more confused today than normal. Patient lives in memory care at [facility].  CBG 146

## 2019-11-05 NOTE — Consult Note (Signed)
Subjective: 1. Acute metabolic encephalopathy   2. Sepsis with acute renal failure, due to unspecified organism, unspecified acute renal failure type, unspecified whether septic shock present ([place])   3. AKI (acute kidney injury) ([place])      CC: Sepsis.  Hx: I was asked to see the patient by Dr. Alroy Bailiff for sepsis with a right UPJ stone with obstructions and left distal ureteral stones without obstruction with associated AKI and hypotension.   She has a history of dementia with chronic foley for bladder management.  She has had multiple UTI's with some increased resistance, although her last culture in February grew proteus and klebsiella with several effective antibiotics.  She was admitted today with altered mental status and a fever to 104.  CT showed a 86mm RUPJ stone, left distal stones and a bladder stone in her decompressed bladder with foley in place.  There is marked right hydro.   She is unable to provide a history.   ROS:  Review of Systems  Unable to perform ROS: Dementia    Allergies  Allergen Reactions  . Ciprofloxacin Other (See Comments)    On MAR  . Oxaprozin Other (See Comments)    DAYPRO (SULFA DRUG) On MAR  . Sulfa Antibiotics Other (See Comments)    On MAR    Past Medical History:  Diagnosis Date  . Chronic kidney disease   . CVA (cerebral infarction)   . Dementia ([place])   . Hyperlipidemia   . Osteopenia   . Stroke ([place])   . UTI (lower urinary tract infection)     Past Surgical History:  Procedure Laterality Date  . ABDOMINAL HYSTERECTOMY    . BREAST LUMPECTOMY    . REPLACEMENT TOTAL KNEE    . TOTAL HIP ARTHROPLASTY      Social History   Socioeconomic History  . Marital status: Widowed    Spouse name: Not on file  . Number of children: Not on file  . Years of education: Not on file  . Highest education level: Not on file  Occupational History  . Not on file  Tobacco Use  . Smoking status: Never Smoker  . Smokeless tobacco: Never Used   Substance and Sexual Activity  . Alcohol use: Yes    Comment: socially: Once every month  . Drug use: No  . Sexual activity: Not on file  Other Topics Concern  . Not on file  Social History Narrative  . Not on file   Social Determinants of Health   Financial Resource Strain:   . Difficulty of Paying Living Expenses:   Food Insecurity:   . Worried About Charity fundraiser in the Last Year:   . Arboriculturist in the Last Year:   Transportation Needs:   . Film/video editor (Medical):   Marland Kitchen Lack of Transportation (Non-Medical):   Physical Activity:   . Days of Exercise per Week:   . Minutes of Exercise per Session:   Stress:   . Feeling of Stress :   Social Connections:   . Frequency of Communication with Friends and Family:   . Frequency of Social Gatherings with Friends and Family:   . Attends Religious Services:   . Active Member of Clubs or Organizations:   . Attends Archivist Meetings:   Marland Kitchen Marital Status:   Intimate Partner Violence:   . Fear of Current or Ex-Partner:   . Emotionally Abused:   Marland Kitchen Physically Abused:   . Sexually Abused:  History reviewed. No pertinent family history.  Anti-infectives: Anti-infectives (From admission, onward)   Start     Dose/Rate Route Frequency Ordered Stop   11/07/19 1200  vancomycin (VANCOCIN) IVPB 1000 mg/200 mL premix     1,000 mg 200 mL/hr over 60 Minutes Intravenous Every 48 hours 11/11/2019 1735     11/06/19 1300  ceFEPIme (MAXIPIME) 2 g in sodium chloride 0.9 % 100 mL IVPB     2 g 200 mL/hr over 30 Minutes Intravenous Every 24 hours 10/29/2019 1735     10/28/2019 1300  ceFEPIme (MAXIPIME) 2 g in sodium chloride 0.9 % 100 mL IVPB     2 g 200 mL/hr over 30 Minutes Intravenous  Once 10/28/2019 1240 10/23/2019 1336   10/16/2019 1245  metroNIDAZOLE (FLAGYL) IVPB 500 mg     500 mg 100 mL/hr over 60 Minutes Intravenous  Once 10/16/2019 1240 11/01/2019 1421   11/01/2019 1245  vancomycin (VANCOCIN) IVPB 1000 mg/200 mL premix      1,000 mg 200 mL/hr over 60 Minutes Intravenous  Once 10/20/2019 1240 11/06/2019 1416      Current Facility-Administered Medications  Medication Dose Route Frequency Provider Last Rate Last Admin  . 0.9 %  sodium chloride infusion  250 mL Intravenous PRN Debbe Odea, MD      . acetaminophen (TYLENOL) tablet 650 mg  650 mg Oral Q6H PRN Debbe Odea, MD       Or  . acetaminophen (TYLENOL) suppository 650 mg  650 mg Rectal Q6H PRN Debbe Odea, MD      . Derrill Memo ON 11/06/2019] ceFEPIme (MAXIPIME) 2 g in sodium chloride 0.9 % 100 mL IVPB  2 g Intravenous Q24H Thomes Lolling, RPH      . heparin injection 5,000 Units  5,000 Units Subcutaneous Q8H Rizwan, Saima, MD      . levothyroxine (SYNTHROID, LEVOTHROID) injection 50 mcg  50 mcg Intravenous Daily Rizwan, Saima, MD      . metoprolol tartrate (LOPRESSOR) injection 5 mg  5 mg Intravenous Q4H PRN Debbe Odea, MD      . ondansetron (ZOFRAN) tablet 4 mg  4 mg Oral Q6H PRN Debbe Odea, MD       Or  . ondansetron (ZOFRAN) injection 4 mg  4 mg Intravenous Q6H PRN Rizwan, Saima, MD      . sodium chloride flush (NS) 0.9 % injection 3 mL  3 mL Intravenous Q12H Rizwan, Saima, MD      . sodium chloride flush (NS) 0.9 % injection 3 mL  3 mL Intravenous PRN Debbe Odea, MD      . Derrill Memo ON 11/07/2019] vancomycin (VANCOCIN) IVPB 1000 mg/200 mL premix  1,000 mg Intravenous Q48H [place], [facility] N[facility], [facility]       Current Outpatient Medications  Medication Sig Dispense Refill  . acetaminophen (TYLENOL) 325 MG tablet Take 650 mg by mouth every 6 (six) hours as needed for moderate pain.    Marland Kitchen apixaban (ELIQUIS) 5 MG TABS tablet Take 1 tablet (5 mg total) by mouth 2 (two) times daily. 60 tablet 1  . atorvastatin (LIPITOR) 20 MG tablet Take 1 tablet (20 mg total) by mouth daily at 6 PM. 30 tablet 0  . brimonidine-timolol (COMBIGAN) 0.2-0.5 % ophthalmic solution Place 1 drop into the left eye every 12 (twelve) hours.    . brinzolamide (AZOPT) 1 % ophthalmic  suspension Place 1 drop into the left eye 2 (two) times daily.    . Cranberry 450 MG CAPS Take 450 mg by  mouth 2 (two) times daily.    Marland Kitchen diltiazem (CARDIZEM CD) 180 MG 24 hr capsule Take 1 capsule (180 mg total) by mouth daily. 30 capsule 1  . docusate sodium (COLACE) 100 MG capsule Take 100 mg by mouth daily.    . DULoxetine (CYMBALTA) 20 MG capsule Take 20 mg by mouth at bedtime.     . Ensure Plus (ENSURE PLUS) LIQD Take 237 mLs by mouth daily.    Marland Kitchen latanoprost (XALATAN) 0.005 % ophthalmic solution Place 1 drop into the left eye at bedtime.    Marland Kitchen letrozole (FEMARA) 2.5 MG tablet Take 2.5 mg by mouth daily.    Marland Kitchen levothyroxine (SYNTHROID, LEVOTHROID) 100 MCG tablet Take 100 mcg by mouth daily before breakfast.     . Melatonin 3 MG TABS Take 3 mg by mouth at bedtime.    . Menthol-Zinc Oxide (CALMOSEPTINE) 0.44-20.6 % OINT Apply 1 application topically 2 (two) times daily. Apply to buttocks for rednedd    . metoprolol tartrate (LOPRESSOR) 25 MG tablet Take 0.5 tablets (12.5 mg total) by mouth 2 (two) times daily. 30 tablet 1  . Multiple Vitamins-Iron (MULTIVITAMINS WITH IRON) TABS tablet Take 1 tablet by mouth at bedtime.    . pantoprazole (PROTONIX) 40 MG tablet Take 1 tablet (40 mg total) by mouth daily. 30 tablet 1  . pregabalin (LYRICA) 25 MG capsule Take 25 mg by mouth at bedtime.    . Psyllium 500 MG CAPS Take 500 mg by mouth at bedtime.     . senna (SENOKOT) 8.6 MG TABS tablet Take 1 tablet by mouth See admin instructions. Monday, Wednesday and Friday in the evening       Objective: Vital signs in last 24 hours: Temp:  [99.4 F (37.4 C)-104.3 F (40.2 C)] 99.4 F (37.4 C) (03/22 1443) Pulse Rate:  [73-109] 79 (03/22 1745) Resp:  [7-25] 15 (03/22 1745) BP: (81-113)/(39-57) 87/45 (03/22 1745) SpO2:  [94 %-97 %] 94 % (03/22 1745)  Intake/Output from previous day: No intake/output data recorded. Intake/Output this shift: Total I/O In: 3450 [IV Piggyback:3450] Out: -    Physical  Exam Vitals reviewed.  Constitutional:      Comments: Obtunded and resting quietly.  HENT:     Head: Normocephalic and atraumatic.  Cardiovascular:     Rate and Rhythm: Normal rate and regular rhythm.     Heart sounds: Normal heart sounds.  Pulmonary:     Effort: Pulmonary effort is normal. No respiratory distress.     Breath sounds: Normal breath sounds.  Abdominal:     General: Abdomen is flat.     Palpations: Abdomen is soft.     Tenderness: There is abdominal tenderness (RUQ).  Genitourinary:    Comments: Foley in place Musculoskeletal:        General: No tenderness.     Right lower leg: No edema.     Left lower leg: No edema.  Skin:    General: Skin is warm and dry.  Neurological:     Comments: Obtunded and unresponsive.  A/O x 0.      Lab Results:  Results for orders placed or performed during the hospital encounter of 10/29/2019 (from the past 24 hour(s))  Lactic acid, plasma     Status: None   Collection Time: 10/18/2019 12:39 PM  Result Value Ref Range   Lactic Acid, Venous 1.9 0.5 - 1.9 mmol/L  Comprehensive metabolic panel     Status: Abnormal   Collection Time: 10/29/2019 12:39 PM  Result Value Ref Range   Sodium 145 135 - 145 mmol/L   Potassium 3.4 (L) 3.5 - 5.1 mmol/L   Chloride 107 98 - 111 mmol/L   CO2 21 (L) 22 - 32 mmol/L   Glucose, Bld 116 (H) 70 - 99 mg/dL   BUN 48 (H) 8 - 23 mg/dL   Creatinine, Ser 3.03 (H) 0.44 - 1.00 mg/dL   Calcium 8.7 (L) 8.9 - 10.3 mg/dL   Total Protein 6.6 6.5 - 8.1 g/dL   Albumin 3.4 (L) 3.5 - 5.0 g/dL   AST 28 15 - 41 U/L   ALT 17 0 - 44 U/L   Alkaline Phosphatase 77 38 - 126 U/L   Total Bilirubin 0.8 0.3 - 1.2 mg/dL   GFR calc non Af Amer 13 (L) >60 mL/min   GFR calc Af Amer 16 (L) >60 mL/min   Anion gap 17 (H) 5 - 15  CBC WITH DIFFERENTIAL     Status: Abnormal   Collection Time: 10/24/2019 12:39 PM  Result Value Ref Range   WBC 20.8 (H) 4.0 - 10.5 K/uL   RBC 3.04 (L) 3.87 - 5.11 MIL/uL   Hemoglobin 9.3 (L) 12.0 - 15.0  g/dL   HCT 32.3 (L) 36.0 - 46.0 %   MCV 106.3 (H) 80.0 - 100.0 fL   MCH 30.6 26.0 - 34.0 pg   MCHC 28.8 (L) 30.0 - 36.0 g/dL   RDW 15.7 (H) 11.5 - 15.5 %   Platelets 158 150 - 400 K/uL   nRBC 0.0 0.0 - 0.2 %   Neutrophils Relative % 92 %   Neutro Abs 19.2 (H) 1.7 - 7.7 K/uL   Lymphocytes Relative 2 %   Lymphs Abs 0.5 (L) 0.7 - 4.0 K/uL   Monocytes Relative 5 %   Monocytes Absolute 1.0 0.1 - 1.0 K/uL   Eosinophils Relative 0 %   Eosinophils Absolute 0.0 0.0 - 0.5 K/uL   Basophils Relative 0 %   Basophils Absolute 0.1 0.0 - 0.1 K/uL   WBC Morphology DOHLE BODIES    Immature Granulocytes 1 %   Abs Immature Granulocytes 0.18 (H) 0.00 - 0.07 K/uL  APTT     Status: Abnormal   Collection Time: 11/04/2019 12:39 PM  Result Value Ref Range   aPTT 41 (H) 24 - 36 seconds  Protime-INR     Status: Abnormal   Collection Time: 11/11/2019 12:39 PM  Result Value Ref Range   Prothrombin Time 25.7 (H) 11.4 - 15.2 seconds   INR 2.4 (H) 0.8 - 1.2  Urinalysis, Routine w reflex microscopic     Status: Abnormal   Collection Time: 11/13/2019 12:39 PM  Result Value Ref Range   Color, Urine YELLOW (A) YELLOW   APPearance TURBID (A) CLEAR   Specific Gravity, Urine 1.016 1.005 - 1.030   pH 8.0 5.0 - 8.0   Glucose, UA NEGATIVE NEGATIVE mg/dL   Hgb urine dipstick LARGE (A) NEGATIVE   Bilirubin Urine NEGATIVE NEGATIVE   Ketones, ur 5 (A) NEGATIVE mg/dL   Protein, ur 100 (A) NEGATIVE mg/dL   Nitrite POSITIVE (A) NEGATIVE   Leukocytes,Ua MODERATE (A) NEGATIVE   RBC / HPF >50 (H) 0 - 5 RBC/hpf   WBC, UA >50 (H) 0 - 5 WBC/hpf   Bacteria, UA MANY (A) NONE SEEN   WBC Clumps PRESENT   Lipase, blood     Status: None   Collection Time: 10/28/2019 12:39 PM  Result Value Ref Range   Lipase 16 11 -  51 U/L  Respiratory Panel by RT PCR (Flu A&B, Covid) - Nasopharyngeal Swab     Status: None   Collection Time: 10/19/2019  2:28 PM   Specimen: Nasopharyngeal Swab  Result Value Ref Range   SARS Coronavirus 2 by RT PCR  NEGATIVE NEGATIVE   Influenza A by PCR NEGATIVE NEGATIVE   Influenza B by PCR NEGATIVE NEGATIVE    BMET Recent Labs    10/15/2019 1239  NA 145  K 3.4*  CL 107  CO2 21*  GLUCOSE 116*  BUN 48*  CREATININE 3.03*  CALCIUM 8.7*   PT/INR Recent Labs    11/04/2019 1239  LABPROT 25.7*  INR 2.4*   ABG No results for input(s): PHART, HCO3 in the last 72 hours.  Invalid input(s): PCO2, PO2  Studies/Results: CT Abdomen Pelvis Wo Contrast  Result Date: 11/03/2019 CLINICAL DATA:  Abdominal pain fever. EXAM: CT ABDOMEN AND PELVIS WITHOUT CONTRAST TECHNIQUE: Multidetector CT imaging of the abdomen and pelvis was performed following the standard protocol without IV contrast. COMPARISON:  10/10/2019 FINDINGS: Lower chest: Small right pleural effusion versus thickening. Unchanged appearance of 4 mm right middle lobe lung nodule. Hepatobiliary: The 8 mm low-attenuation structure in lateral segment of left lobe of liver is unchanged and too small to reliably characterize gallbladder unremarkable. No biliary dilatation. Pancreas: Fatty replacement of the pancreas. No main duct dilatation, inflammation or mass. Spleen: Normal in size without focal abnormality. Adrenals/Urinary Tract: Normal appearance of the adrenal glands. There is a large cyst within upper pole of right kidney measuring 6.3 cm. Incompletely characterized without IV contrast. Similarly 2.3 cm cyst within upper pole of left kidney is also incompletely characterized without IV contrast. New right-sided hydronephrosis. Previously noted stone within the right renal pelvis has migrated to the right UPJ. This has a long axis dimension of 1.3 cm, image 39/8. Poorly defined calcifications within the left renal collecting system again noted without hydronephrosis. The distal left ureter is increased in caliber containing multiple (approximately 5 stones measuring up to 7 mm in long axis, image 50/8. The bladder is collapsed around a Foley catheter.  Calcification within the right-side of the bladder measures 3 mm. Stomach/Bowel: Stomach is within normal limits. Appendix appears normal. No evidence of bowel wall thickening, distention, or inflammatory changes. Vascular/Lymphatic: Aortic atherosclerosis. No aneurysm. No abdominopelvic adenopathy. Reproductive: Status post hysterectomy. Right adnexal mass measures 7.0 x 5.5 cm, image 72/3. Unchanged in size from previous exam. Other: No ascites or fluid collections within the abdomen or pelvis. Musculoskeletal: Previous ORIF left femur. Mild scoliosis and degenerative disc disease. IMPRESSION: 1. Interval migration of right renal pelvis stone to the right UPJ. There is new right-sided hydronephrosis. 2. Increase caliber of the distal left ureter which now contains multiple stones measuring up to 7 mm in long axis. 3. Stable right adnexal cyst mass. This would be better assessed with pelvic sonogram. 4. Small right pleural effusion versus thickening. 5. Bilateral kidney cysts. Incompletely characterized without IV contrast. 6. Small right-side of bladder calculus measuring 3 mm. 7. Stable low-attenuation structure within lateral segment of left lobe of liver is too small to reliably characterize but favored to represent a benign abnormality. 8. Stable appearance of 4 mm right middle lobe lung nodule. This is favored to represent a benign abnormality. No follow-up needed if patient is low-risk. Non-contrast chest CT can be considered in 12 months if patient is high-risk. This recommendation follows the consensus statement: Guidelines for Management of Incidental Pulmonary Nodules Detected on CT  Images: From the Fleischner Society 2017; Radiology 2017; [phone]. Aortic Atherosclerosis (ICD10-I70.0). Electronically Signed   By: Kerby Moors M.D.   On: 11/11/2019 14:22   CT Head Wo Contrast  Result Date: 10/19/2019 CLINICAL DATA:  Altered mental status. EXAM: CT HEAD WITHOUT CONTRAST TECHNIQUE: Contiguous  axial images were obtained from the base of the skull through the vertex without intravenous contrast. COMPARISON:  April 26, 2019. FINDINGS: Brain: Mild diffuse cortical atrophy is noted. Mild chronic ischemic white matter disease is noted. Right parietal encephalomalacia is noted consistent with old infarction. Old right occipital infarction is noted. No mass effect or midline shift is noted. Ventricular size is within normal limits. There is no evidence of mass lesion, hemorrhage or acute infarction. Vascular: No hyperdense vessel or unexpected calcification. Skull: Normal. Negative for fracture or focal lesion. Sinuses/Orbits: No acute finding. Other: None. IMPRESSION: Mild diffuse cortical atrophy. Mild chronic ischemic white matter disease. 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 No acute intracranial abnormality seen. Electronically Signed   By: Marijo Conception M.D.   On: 10/26/2019 14:17   DG Chest Port 1 View  Result Date: 10/26/2019 CLINICAL DATA:  Rule out infection. Additional history provided: Patient coming from the EMS for sepsis, altered this morning, temperature 103.5 EXAM: PORTABLE CHEST 1 VIEW COMPARISON:  Chest radiograph 09/21/2019 FINDINGS: Heart size at the upper limits of normal. No evidence of airspace consolidation within the lungs. No evidence of pleural effusion or pneumothorax. No acute bony abnormality. IMPRESSION: No evidence of acute cardiopulmonary abnormality. Heart size at the upper limits of normal. Electronically Signed   By: Kellie Simmering DO   On: 10/21/2019 13:36   I have reviewed the hospital records, labs and imaging and discussed the case with the EDP.   Assessment/Plan: 1. Right UPJ stone with obstruction and sepsis. 2. Left distal stones without obstruction. 3. Chronic foley catheter drainage with recurrent UTI's.  4. AKI. 5. Severe dementia.      I have spoken to her son regarding the indications for placement of ureteral stents to maximize renal  drainage and provide source control which would improve her chances of surviving the sepsis.  I reviewed the risks of the procedure including bleeding, worsening sepsis, ureteral injury, inability to place the stent and anesthetic complications.  I also discussed the future need for ureteroscopy to complete the stone removal.   He is going to speak to his brother and let me know whether they would like for me to proceed with stenting or if they would prefer medical therapy and comfort care since her baseline condition is poor and declining.        CC: Dr. Debbe Odea.      Irine Seal 10/30/2019 [phone]

## 2019-11-05 NOTE — Sepsis Progress Note (Addendum)
Pt has had full sepsis fluid resuscitation and antibiotics. LA 1.9. BP remains soft. She is a DNR. Family is deciding whether she will go to OR for ureteral stents or opt for comfort care. Tracking completed.

## 2019-11-06 LAB — BLOOD CULTURE ID PANEL (REFLEXED)

## 2019-11-06 MED ORDER — HYDROMORPHONE HCL 1 MG/ML IJ SOLN
0.5000 mg | INTRAMUSCULAR | Status: DC | PRN
  Administered 2019-11-06 – 2019-11-08 (×5): 0.5 mg via INTRAVENOUS
  Filled 2019-11-06 (×5): qty 0.5

## 2019-11-06 NOTE — Progress Notes (Signed)
Report given to [place] on [address]. Family aware pt transferring to room 1341.

## 2019-11-06 NOTE — Progress Notes (Signed)
PHARMACY - PHYSICIAN COMMUNICATION CRITICAL VALUE ALERT - BLOOD CULTURE IDENTIFICATION (BCID)  Victoria Walsh is an 86 y.o. female who presented to [facility] on 10/21/2019 with a chief complaint of blood infection  Assessment:  Patient is now comfort care, antibiotics have been d/c'd (include suspected source if known)  Name of physician (or Provider) ContactedOsborne Oman, NP  Current antibiotics: none  Changes to prescribed antibiotics recommended:    Results for orders placed or performed during the hospital encounter of 11/07/2019  Blood Culture ID Panel (Reflexed) (Collected: 11/07/2019 12:39 PM)  Result Value Ref Range   Enterococcus species NOT DETECTED NOT DETECTED   Listeria monocytogenes NOT DETECTED NOT DETECTED   Staphylococcus species NOT DETECTED NOT DETECTED   Staphylococcus aureus (BCID) NOT DETECTED NOT DETECTED   Streptococcus species NOT DETECTED NOT DETECTED   Streptococcus agalactiae NOT DETECTED NOT DETECTED   Streptococcus pneumoniae NOT DETECTED NOT DETECTED   Streptococcus pyogenes NOT DETECTED NOT DETECTED   Acinetobacter baumannii NOT DETECTED NOT DETECTED   Enterobacteriaceae species DETECTED (A) NOT DETECTED   Enterobacter cloacae complex NOT DETECTED NOT DETECTED   Escherichia coli NOT DETECTED NOT DETECTED   Klebsiella oxytoca NOT DETECTED NOT DETECTED   Klebsiella pneumoniae DETECTED (A) NOT DETECTED   Proteus species NOT DETECTED NOT DETECTED   Serratia marcescens NOT DETECTED NOT DETECTED   Carbapenem resistance NOT DETECTED NOT DETECTED   Haemophilus influenzae NOT DETECTED NOT DETECTED   Neisseria meningitidis NOT DETECTED NOT DETECTED   Pseudomonas aeruginosa NOT DETECTED NOT DETECTED   Candida albicans NOT DETECTED NOT DETECTED   Candida glabrata NOT DETECTED NOT DETECTED   Candida krusei NOT DETECTED NOT DETECTED   Candida parapsilosis NOT DETECTED NOT DETECTED   Candida tropicalis NOT DETECTED NOT DETECTED    [name]  [place] 11/06/2019  1:35 AM

## 2019-11-06 NOTE — Progress Notes (Signed)
Patient not in apparent distress at this time. Suctioned mouth/ oral cavity to rid clear sectetions, oral care. Cold washcloth to face and  Repositioned patient. Respirations unlabored, even with periods of apnea. Family support/ education given, will continue to monitor frequently. Call bell within reach.

## 2019-11-06 NOTE — Progress Notes (Signed)
Patient still remains apneic at times with more shallow breathing than this afternoon. Patient has not displayed any s/sx of pain or discomfort since this am at shift change. No longer clammy; reapplied blanket for comfort. Pt has been repositioned for comfort. Family questions and concerns addressed. No further questions at this time.

## 2019-11-06 NOTE — Plan of Care (Signed)

## 2019-11-06 NOTE — Progress Notes (Signed)
[facility] PROGRESS NOTE  Victoria Walsh  [id number] DOB: Jan 10, 1932 DOA: 11/04/2019 PCP: Reymundo Poll, MD  Brief Narrative: Victoria Walsh is an 86 y.o. female with a history of dementia, CKD, CVA, chronic foley catheterization and recurrent UTIs who presented from memory care for worsening confusion, appearing lethargic on arrival. She was septic with fever, tachycardia, hypotension, tachypnea, leukocytosis and with evidence of UTI with AKI (Cr 3.03). CT abdomen demonstrated interval migration of a right renal pelvis stone to the UPJ w/hydronephrosis as well as increased left ureter caliber associated with multiple stones. Cultures were drawn, broad spectrum antibiotics were started and IV fluids were given. This did not improve hypotension or lethargy. Urology was consulted, though after goals of care discussions the family has confirmed their desire to proceed with comfort measures only which has been implemented. Due to severe vital sign instability, transfer to residential hospice is not felt to be feasible. Inpatient demise is anticipated.  Subjective: Lethargic appears to be in no pain or respiratory distress.Family at the bedside. Questions regarding the next few hours addressed.   Objective: BP (!) 63/18 (BP Location: Right Leg)   Pulse 97   Temp 99.8 F (37.7 C) (Axillary)   Resp 14   Ht 5\' 4"  (1.626 m) Comment: estimated  Wt 80.7 kg   SpO2 (!) 89%   BMI 30.54 kg/m   Gen: Elderly female in no distress Pulm: Nonlabored, clear anteriorly  CV: Regular without JVD, no edema GI: Soft, w/bowel sounds Neuro: Poorly responsive. Ext: Warm, no deformities Skin: No rashes, lesions or ulcers on limited visualization today.  Assessment & Plan: Principal Problem:   Sepsis secondary to UTI [facility]) Active Problems:   Acute lower UTI   AKI (acute kidney injury) ([place])   Dementia with behavioral disturbance (HCC)   Hypothyroidism   Chronic diastolic CHF (congestive heart failure)  (HCC)   Essential hypertension   PAF (paroxysmal atrial fibrillation) (HCC)   Acute metabolic encephalopathy   Hydronephrosis due to obstruction of ureter  DNR, present on admission, goals of care discussions, palliative care encounter, end of life discussions:  - Comfort measures only, d/w family at bedside on AM and PM rounds as well as Therapist, sports. Symptoms are well controlled. Will continue medications  Severe sepsis secondary to UTI with AKI - related to chronic foley: Continue foley, DC fluids, abx per [place] discussions.   Obstructive urolithiasis and right hydronephrosis: Urology consulted, though family does not wish to pursue treatment.     AKI on stage IIIb CKD: No further intervention planned.     Dementia with behavioral disturbance with acute metabolic encephalopathy due to sepsis: Supportive measures.     Hypothyroidism: Will not continue synthroid as she's at end of life.    Chronic diastolic CHF: Stable.    - not on diuretics at baseline    Essential hypertension: Hypotensive. Holding medications.     PAF: Stop medications due to comfort care patient  Anemia: No intervention planned.    Patrecia Pour, MD [facility] [email or web address] 11/06/2019, 2:48 PM

## 2019-11-06 NOTE — Progress Notes (Signed)
mouthcare performed q2 hours this shift

## 2019-11-07 DIAGNOSIS — R7881 Bacteremia: Secondary | ICD-10-CM

## 2019-11-07 DIAGNOSIS — D539 Nutritional anemia, unspecified: Secondary | ICD-10-CM

## 2019-11-07 DIAGNOSIS — A498 Other bacterial infections of unspecified site: Secondary | ICD-10-CM

## 2019-11-07 DIAGNOSIS — Z7189 Other specified counseling: Secondary | ICD-10-CM

## 2019-11-07 DIAGNOSIS — Z515 Encounter for palliative care: Secondary | ICD-10-CM

## 2019-11-07 DIAGNOSIS — N139 Obstructive and reflux uropathy, unspecified: Secondary | ICD-10-CM

## 2019-11-07 DIAGNOSIS — Z66 Do not resuscitate: Secondary | ICD-10-CM

## 2019-11-07 DIAGNOSIS — D72829 Elevated white blood cell count, unspecified: Secondary | ICD-10-CM

## 2019-11-07 DIAGNOSIS — Z978 Presence of other specified devices: Secondary | ICD-10-CM

## 2019-11-07 DIAGNOSIS — N183 Chronic kidney disease, stage 3 unspecified: Secondary | ICD-10-CM

## 2019-11-07 LAB — URINE CULTURE: Culture: >=100000 — AB

## 2019-11-07 MED ORDER — MORPHINE SULFATE (PF) 2 MG/ML IV SOLN
1.0000 mg | INTRAVENOUS | Status: DC | PRN
  Administered 2019-11-08 (×5): 1 mg via INTRAVENOUS
  Filled 2019-11-07 (×4): qty 1

## 2019-11-07 NOTE — Progress Notes (Signed)
Patient mouth care given every 2-3 hours this shift and iv flushed- patient spoke one full sentence today. Denies pain and answers yes and no questions. mepilex dressings changed to heels today. Opens eyes spontaneously periodiocally. Upper partial bed bath given. Support given to patient and family.

## 2019-11-07 NOTE — Progress Notes (Signed)
PROGRESS NOTE    Victoria Walsh  [id number] DOB: Oct 05, 1931 DOA: 10/22/2019 PCP: Reymundo Poll, MD     Brief Narrative:  Victoria Walsh is an 86 y.o. female with a history of dementia, CKD, CVA, chronic foley catheterization and recurrent UTIs who presented from memory care facility for worsening confusion, appearing lethargic on arrival. She was septic with fever, tachycardia, hypotension, tachypnea, leukocytosis and with evidence of UTI with AKI (Cr 3.03). CT abdomen demonstrated interval migration of a right renal pelvis stone to the UPJ w/hydronephrosis as well as increased left ureter caliber associated with multiple stones. Cultures were drawn, broad spectrum antibiotics were started and IV fluids were given. This did not improve hypotension or lethargy. Urology was consulted, though after goals of care discussions, family has confirmed their desire to proceed with comfort measures only which has been implemented. Due to severe vital sign instability, transfer to residential hospice is not felt to be feasible. Inpatient demise is anticipated.  New events last 24 hours / Subjective: Two sons at bedside.  Patient apparently had an episode of being lucid and more alert for about an hour yesterday.  More lethargic today.  Family also notes that patient has been having worsening shortness of breath/dyspnea.  Assessment & Plan:   Principal Problem:   Severe sepsis ([facility]) Active Problems:   Acute lower UTI   AKI (acute kidney injury) ([place])   Dementia with behavioral disturbance (HCC)   Hypothyroidism   Chronic diastolic CHF (congestive heart failure) (HCC)   Essential hypertension   PAF (paroxysmal atrial fibrillation) (HCC)   Sepsis secondary to UTI ([facility])   Acute metabolic encephalopathy   Hydronephrosis due to obstruction of ureter   DNR (do not resuscitate)   Goals of care, counseling/discussion   Comfort measures only status   Obstructive uropathy   CKD (chronic kidney disease) stage  3, GFR 30-59 ml/min   Chronic indwelling Foley catheter   Leukocytosis   Macrocytic anemia   Bacteremia due to Klebsiella pneumoniae   Bacterial infection due to Proteus mirabilis CKD 3b    Continue comfort measures.  Added morphine for air hunger/dyspnea.  Anticipate in-hospital death.   Code Status: DNR Family Communication: Sons at bedside Disposition Plan:  . Patient is from memory care prior to admission. . Inpatient death anticipated.    Consultants:   Urology    Antimicrobials:  Anti-infectives (From admission, onward)   Start     Dose/Rate Route Frequency Ordered Stop   11/07/19 1200  vancomycin (VANCOCIN) IVPB 1000 mg/200 mL premix  Status:  Discontinued     1,000 mg 200 mL/hr over 60 Minutes Intravenous Every 48 hours 11/11/2019 1735 10/31/2019 2201   11/06/19 1300  ceFEPIme (MAXIPIME) 2 g in sodium chloride 0.9 % 100 mL IVPB  Status:  Discontinued     2 g 200 mL/hr over 30 Minutes Intravenous Every 24 hours 10/16/2019 1735 10/16/2019 2201   10/20/2019 1300  ceFEPIme (MAXIPIME) 2 g in sodium chloride 0.9 % 100 mL IVPB     2 g 200 mL/hr over 30 Minutes Intravenous  Once 11/02/2019 1240 10/28/2019 1336   10/20/2019 1245  metroNIDAZOLE (FLAGYL) IVPB 500 mg     500 mg 100 mL/hr over 60 Minutes Intravenous  Once 10/17/2019 1240 11/09/2019 1421   11/01/2019 1245  vancomycin (VANCOCIN) IVPB 1000 mg/200 mL premix     1,000 mg 200 mL/hr over 60 Minutes Intravenous  Once 11/01/2019 1240 11/04/2019 1416        Objective: Vitals:  11/06/19 0430 11/06/19 1016 11/06/19 2221 11/07/19 1111  BP:  (!) 63/18 (!) 119/101 96/63  Pulse:  97 (!) 121 80  Resp: (!) 26 14 18 20   Temp:  99.8 F (37.7 C)  99.2 F (37.3 C)  TempSrc:  Axillary  Oral  SpO2:  (!) 89%  95%  Weight:      Height:        Intake/Output Summary (Last 24 hours) at 11/07/2019 1255 Last data filed at 11/07/2019 1100 Gross per 24 hour  Intake 0 ml  Output 750 ml  Net -750 ml   Filed Weights   11/04/2019 1841  Weight: 80.7  kg    Examination:  General exam: Appears calm and comfortable    Data Reviewed: I have personally reviewed following labs and imaging studies  CBC: Recent Labs  Lab 10/20/2019 1239 10/26/2019 2029  WBC 20.8* 17.2*  NEUTROABS 19.2* 15.7*  HGB 9.3* 6.6*  HCT 32.3* 22.7*  MCV 106.3* 103.7*  PLT 158 [id number]*   Basic Metabolic Panel: Recent Labs  Lab 10/27/2019 1239 10/30/2019 2029  NA 145 145  K 3.4* 3.9  CL 107 117*  CO2 21* 18*  GLUCOSE 116* 148*  BUN 48* 44*  CREATININE 3.03* 2.53*  CALCIUM 8.7* 7.1*   GFR: Estimated Creatinine Clearance: 16.7 mL/min (A) (by C-G formula based on SCr of 2.53 mg/dL (H)). Liver Function Tests: Recent Labs  Lab 10/20/2019 1239  AST 28  ALT 17  ALKPHOS 77  BILITOT 0.8  PROT 6.6  ALBUMIN 3.4*   Recent Labs  Lab 10/18/2019 1239  LIPASE 16   No results for input(s): AMMONIA in the last 168 hours. Coagulation Profile: Recent Labs  Lab 10/28/2019 1239  INR 2.4*   Cardiac Enzymes: No results for input(s): CKTOTAL, CKMB, CKMBINDEX, TROPONINI in the last 168 hours. BNP (last 3 results) No results for input(s): PROBNP in the last 8760 hours. HbA1C: No results for input(s): HGBA1C in the last 72 hours. CBG: No results for input(s): GLUCAP in the last 168 hours. Lipid Profile: No results for input(s): CHOL, HDL, LDLCALC, TRIG, CHOLHDL, LDLDIRECT in the last 72 hours. Thyroid Function Tests: No results for input(s): TSH, T4TOTAL, FREET4, T3FREE, THYROIDAB in the last 72 hours. Anemia Panel: No results for input(s): VITAMINB12, FOLATE, FERRITIN, TIBC, IRON, RETICCTPCT in the last 72 hours. Sepsis Labs: Recent Labs  Lab 11/06/2019 1239 10/29/2019 2029  LATICACIDVEN 1.9 1.6    Recent Results (from the past 240 hour(s))  Blood Culture (routine x 2)     Status: Abnormal   Collection Time: 10/17/2019 12:39 PM   Specimen: BLOOD  Result Value Ref Range Status   Specimen Description   Final    BLOOD RIGHT ANTECUBITAL Performed at [facility] [address] [place], [place] [address]    Special Requests   Final    BOTTLES DRAWN AEROBIC AND ANAEROBIC Blood Culture adequate volume Performed at [facility] [address] [place], [place] [address]    Culture  Setup Time   Final    IN BOTH AEROBIC AND ANAEROBIC BOTTLES GRAM NEGATIVE RODS Organism ID to follow CRITICAL RESULT CALLED TO, READ BACK BY AND VERIFIED WITH: Lavell Luster [facility] 11/06/19 0123 JDW Performed at [facility], [place] [address] [place], [place] [address]    Culture KLEBSIELLA PNEUMONIAE (A)  Final   Report Status 11/07/2019 FINAL  Final   Organism ID, Bacteria KLEBSIELLA PNEUMONIAE  Final  Susceptibility   Klebsiella pneumoniae - MIC*    AMPICILLIN >=32 RESISTANT Resistant     CEFAZOLIN <=4 SENSITIVE Sensitive     CEFEPIME <=0.12 SENSITIVE Sensitive     CEFTAZIDIME <=1 SENSITIVE Sensitive     CEFTRIAXONE <=0.25 SENSITIVE Sensitive     CIPROFLOXACIN <=0.25 SENSITIVE Sensitive     GENTAMICIN <=1 SENSITIVE Sensitive     IMIPENEM <=0.25 SENSITIVE Sensitive     TRIMETH/SULFA <=20 SENSITIVE Sensitive     AMPICILLIN/SULBACTAM 4 SENSITIVE Sensitive     PIP/TAZO <=4 SENSITIVE Sensitive     * KLEBSIELLA PNEUMONIAE  Urine culture     Status: Abnormal   Collection Time: 11/13/2019 12:39 PM   Specimen: In/Out Cath Urine  Result Value Ref Range Status   Specimen Description   Final    IN/OUT CATH URINE Performed at [facility] [address], [place], [place] [address]    Special Requests   Final    NONE Performed at [facility], [place] [address] [place], [place] [address]    Culture (A)  Final    >=100,000 COLONIES/mL KLEBSIELLA PNEUMONIAE >=100,000 COLONIES/mL PROTEUS MIRABILIS    Report Status 11/07/2019 FINAL  Final   Organism ID, Bacteria KLEBSIELLA PNEUMONIAE (A)  Final   Organism ID, Bacteria PROTEUS MIRABILIS (A)  Final      Susceptibility   Klebsiella  pneumoniae - MIC*    AMPICILLIN >=32 RESISTANT Resistant     CEFAZOLIN <=4 SENSITIVE Sensitive     CEFTRIAXONE <=0.25 SENSITIVE Sensitive     CIPROFLOXACIN <=0.25 SENSITIVE Sensitive     GENTAMICIN <=1 SENSITIVE Sensitive     IMIPENEM <=0.25 SENSITIVE Sensitive     NITROFURANTOIN 64 INTERMEDIATE Intermediate     TRIMETH/SULFA <=20 SENSITIVE Sensitive     AMPICILLIN/SULBACTAM 4 SENSITIVE Sensitive     PIP/TAZO <=4 SENSITIVE Sensitive     * >=100,000 COLONIES/mL KLEBSIELLA PNEUMONIAE   Proteus mirabilis - MIC*    AMPICILLIN <=2 SENSITIVE Sensitive     CEFAZOLIN <=4 SENSITIVE Sensitive     CEFTRIAXONE <=0.25 SENSITIVE Sensitive     CIPROFLOXACIN <=0.25 SENSITIVE Sensitive     GENTAMICIN <=1 SENSITIVE Sensitive     IMIPENEM 1 SENSITIVE Sensitive     NITROFURANTOIN 128 RESISTANT Resistant     TRIMETH/SULFA <=20 SENSITIVE Sensitive     AMPICILLIN/SULBACTAM <=2 SENSITIVE Sensitive     PIP/TAZO <=4 SENSITIVE Sensitive     * >=100,000 COLONIES/mL PROTEUS MIRABILIS  Blood Culture ID Panel (Reflexed)     Status: Abnormal   Collection Time: 11/07/2019 12:39 PM  Result Value Ref Range Status   Enterococcus species NOT DETECTED NOT DETECTED Final   Listeria monocytogenes NOT DETECTED NOT DETECTED Final   Staphylococcus species NOT DETECTED NOT DETECTED Final   Staphylococcus aureus (BCID) NOT DETECTED NOT DETECTED Final   Streptococcus species NOT DETECTED NOT DETECTED Final   Streptococcus agalactiae NOT DETECTED NOT DETECTED Final   Streptococcus pneumoniae NOT DETECTED NOT DETECTED Final   Streptococcus pyogenes NOT DETECTED NOT DETECTED Final   Acinetobacter baumannii NOT DETECTED NOT DETECTED Final   Enterobacteriaceae species DETECTED (A) NOT DETECTED Final    Comment: Enterobacteriaceae represent a large family of gram-negative bacteria, not a single organism. CRITICAL RESULT CALLED TO, READ BACK BY AND VERIFIED WITH: J GRIMSLEY PHARMD 11/06/19 0123 JDW    Enterobacter cloacae complex  NOT DETECTED NOT DETECTED Final   Escherichia coli NOT DETECTED NOT DETECTED Final   Klebsiella oxytoca NOT DETECTED NOT DETECTED Final  Klebsiella pneumoniae DETECTED (A) NOT DETECTED Final    Comment: CRITICAL RESULT CALLED TO, READ BACK BY AND VERIFIED WITH: Lavell Luster [facility] 11/06/19 0123 JDW    Proteus species NOT DETECTED NOT DETECTED Final   Serratia marcescens NOT DETECTED NOT DETECTED Final   Carbapenem resistance NOT DETECTED NOT DETECTED Final   Haemophilus influenzae NOT DETECTED NOT DETECTED Final   Neisseria meningitidis NOT DETECTED NOT DETECTED Final   Pseudomonas aeruginosa NOT DETECTED NOT DETECTED Final   Candida albicans NOT DETECTED NOT DETECTED Final   Candida glabrata NOT DETECTED NOT DETECTED Final   Candida krusei NOT DETECTED NOT DETECTED Final   Candida parapsilosis NOT DETECTED NOT DETECTED Final   Candida tropicalis NOT DETECTED NOT DETECTED Final    Comment: Performed at [facility], [place] [address], [place], [place] [address]  Blood Culture (routine x 2)     Status: Abnormal (Preliminary result)   Collection Time: 10/25/2019  1:05 PM   Specimen: BLOOD  Result Value Ref Range Status   Specimen Description   Final    BLOOD RIGHT HAND Performed at [facility] [address] [place], [place] [address]    Special Requests   Final    BOTTLES DRAWN AEROBIC AND ANAEROBIC Blood Culture adequate volume Performed at [facility] [address], [place], [place] [address]    Culture  Setup Time   Final    IN BOTH AEROBIC AND ANAEROBIC BOTTLES GRAM NEGATIVE RODS CRITICAL VALUE NOTED.  VALUE IS CONSISTENT WITH PREVIOUSLY REPORTED AND CALLED VALUE.    Culture (A)  Final    PROTEUS MIRABILIS KLEBSIELLA PNEUMONIAE SUSCEPTIBILITIES TO FOLLOW CRITICAL RESULT CALLED TO, READ BACK BY AND VERIFIED WITH: [place].  [id number] FCP Performed at [facility], [place] [address], [place], [place] [address]     Report Status PENDING  Incomplete  Respiratory Panel by RT PCR (Flu A&B, Covid) - Nasopharyngeal Swab     Status: None   Collection Time: 10/21/2019  2:28 PM   Specimen: Nasopharyngeal Swab  Result Value Ref Range Status   SARS Coronavirus 2 by RT PCR NEGATIVE NEGATIVE Final    Comment: (NOTE) SARS-CoV-2 target nucleic acids are NOT DETECTED. The SARS-CoV-2 RNA is generally detectable in upper respiratoy specimens during the acute phase of infection. The lowest concentration of SARS-CoV-2 viral copies this assay can detect is 131 copies/mL. A negative result does not preclude SARS-Cov-2 infection and should not be used as the sole basis for treatment or other patient management decisions. A negative result may occur with  improper specimen collection/handling, submission of specimen other than nasopharyngeal swab, presence of viral mutation(s) within the areas targeted by this assay, and inadequate number of viral copies (<131 copies/mL). A negative result must be combined with clinical observations, patient history, and epidemiological information. The expected result is Negative. Fact Sheet for Patients:  PinkCheek.be Fact Sheet for Healthcare Providers:  GravelBags.it This test is not yet ap proved or cleared by the [place] FDA and  has been authorized for detection and/or diagnosis of SARS-CoV-2 by FDA under an Emergency Use Authorization (EUA). This EUA will remain  in effect (meaning this test can be used) for the duration of the COVID-19 declaration under Section 564(b)(1) of the Act, 21 U.S.C. section 360bbb-3(b)(1), unless the authorization is terminated or revoked sooner.    Influenza A by PCR NEGATIVE NEGATIVE Final   Influenza B by PCR NEGATIVE NEGATIVE Final  Comment: (NOTE) The Xpert Xpress SARS-CoV-2/FLU/RSV assay is intended as an aid in  the diagnosis of influenza from Nasopharyngeal swab specimens and   should not be used as a sole basis for treatment. Nasal washings and  aspirates are unacceptable for Xpert Xpress SARS-CoV-2/FLU/RSV  testing. Fact Sheet for Patients: PinkCheek.be Fact Sheet for Healthcare Providers: GravelBags.it This test is not yet approved or cleared by the [place] FDA and  has been authorized for detection and/or diagnosis of SARS-CoV-2 by  FDA under an Emergency Use Authorization (EUA). This EUA will remain  in effect (meaning this test can be used) for the duration of the  Covid-19 declaration under Section 564(b)(1) of the Act, 21  U.S.C. section 360bbb-3(b)(1), unless the authorization is  terminated or revoked. Performed at [facility], [place] [address], [place], [place] [address]   MRSA PCR Screening     Status: Abnormal   Collection Time: 10/15/2019  6:48 PM   Specimen: Nasal Mucosa; Nasopharyngeal  Result Value Ref Range Status   MRSA by PCR POSITIVE (A) NEGATIVE Final    Comment:        The GeneXpert MRSA Assay (FDA approved for NASAL specimens only), is one component of a comprehensive MRSA colonization surveillance program. It is not intended to diagnose MRSA infection nor to guide or monitor treatment for MRSA infections. RESULT CALLED TO, READ BACK BY AND VERIFIED WITH: M.WATSON AT 2301 ON 10/31/2019 BY N.THOMPSON Performed at [facility], [place] [address] [place], [place] [address]       Radiology Studies: CT Abdomen Pelvis Wo Contrast  Result Date: 11/04/2019 CLINICAL DATA:  Abdominal pain fever. EXAM: CT ABDOMEN AND PELVIS WITHOUT CONTRAST TECHNIQUE: Multidetector CT imaging of the abdomen and pelvis was performed following the standard protocol without IV contrast. COMPARISON:  10/10/2019 FINDINGS: Lower chest: Small right pleural effusion versus thickening. Unchanged appearance of 4 mm right middle lobe lung nodule. Hepatobiliary: The 8 mm  low-attenuation structure in lateral segment of left lobe of liver is unchanged and too small to reliably characterize gallbladder unremarkable. No biliary dilatation. Pancreas: Fatty replacement of the pancreas. No main duct dilatation, inflammation or mass. Spleen: Normal in size without focal abnormality. Adrenals/Urinary Tract: Normal appearance of the adrenal glands. There is a large cyst within upper pole of right kidney measuring 6.3 cm. Incompletely characterized without IV contrast. Similarly 2.3 cm cyst within upper pole of left kidney is also incompletely characterized without IV contrast. New right-sided hydronephrosis. Previously noted stone within the right renal pelvis has migrated to the right UPJ. This has a long axis dimension of 1.3 cm, image 39/8. Poorly defined calcifications within the left renal collecting system again noted without hydronephrosis. The distal left ureter is increased in caliber containing multiple (approximately 5 stones measuring up to 7 mm in long axis, image 50/8. The bladder is collapsed around a Foley catheter. Calcification within the right-side of the bladder measures 3 mm. Stomach/Bowel: Stomach is within normal limits. Appendix appears normal. No evidence of bowel wall thickening, distention, or inflammatory changes. Vascular/Lymphatic: Aortic atherosclerosis. No aneurysm. No abdominopelvic adenopathy. Reproductive: Status post hysterectomy. Right adnexal mass measures 7.0 x 5.5 cm, image 72/3. Unchanged in size from previous exam. Other: No ascites or fluid collections within the abdomen or pelvis. Musculoskeletal: Previous ORIF left femur. Mild scoliosis and degenerative disc disease. IMPRESSION: 1. Interval migration of right renal pelvis stone to the right UPJ. There is new right-sided hydronephrosis. 2. Increase caliber of the distal left  ureter which now contains multiple stones measuring up to 7 mm in long axis. 3. Stable right adnexal cyst mass. This would be  better assessed with pelvic sonogram. 4. Small right pleural effusion versus thickening. 5. Bilateral kidney cysts. Incompletely characterized without IV contrast. 6. Small right-side of bladder calculus measuring 3 mm. 7. Stable low-attenuation structure within lateral segment of left lobe of liver is too small to reliably characterize but favored to represent a benign abnormality. 8. Stable appearance of 4 mm right middle lobe lung nodule. This is favored to represent a benign abnormality. No follow-up needed if patient is low-risk. Non-contrast chest CT can be considered in 12 months if patient is high-risk. This recommendation follows the consensus statement: Guidelines for Management of Incidental Pulmonary Nodules Detected on CT Images: From the Fleischner Society 2017; Radiology 2017; 284:228-243. Aortic Atherosclerosis (ICD10-I70.0). Electronically Signed   By: Kerby Moors M.D.   On: 11/10/2019 14:22   CT Head Wo Contrast  Result Date: 10/15/2019 CLINICAL DATA:  Altered mental status. EXAM: CT HEAD WITHOUT CONTRAST TECHNIQUE: Contiguous axial images were obtained from the base of the skull through the vertex without intravenous contrast. COMPARISON:  April 26, 2019. FINDINGS: Brain: Mild diffuse cortical atrophy is noted. Mild chronic ischemic white matter disease is noted. Right parietal encephalomalacia is noted consistent with old infarction. Old right occipital infarction is noted. No mass effect or midline shift is noted. Ventricular size is within normal limits. There is no evidence of mass lesion, hemorrhage or acute infarction. Vascular: No hyperdense vessel or unexpected calcification. Skull: Normal. Negative for fracture or focal lesion. Sinuses/Orbits: No acute finding. Other: None. IMPRESSION: Mild diffuse cortical atrophy. Mild chronic ischemic white matter disease. 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 No acute intracranial abnormality seen. Electronically Signed   By: Marijo Conception M.D.   On: 11/10/2019 14:17   DG Chest Port 1 View  Result Date: 10/24/2019 CLINICAL DATA:  Rule out infection. Additional history provided: Patient coming from the EMS for sepsis, altered this morning, temperature 103.5 EXAM: PORTABLE CHEST 1 VIEW COMPARISON:  Chest radiograph 09/21/2019 FINDINGS: Heart size at the upper limits of normal. No evidence of airspace consolidation within the lungs. No evidence of pleural effusion or pneumothorax. No acute bony abnormality. IMPRESSION: No evidence of acute cardiopulmonary abnormality. Heart size at the upper limits of normal. Electronically Signed   By: Kellie Simmering DO   On: 11/13/2019 13:36      Scheduled Meds: Continuous Infusions:   LOS: 2 days      Time spent: 25 minutes   Dessa Phi, DO [facility] 11/07/2019, 12:55 PM   Available via Epic secure chat 7am-7pm After these hours, please refer to coverage provider listed on [email or web address]

## 2019-11-07 NOTE — Final Consult Note (Signed)
Based on family's decision to move forward with comfort care only, I will sign off.  Please reconsult if needed.

## 2019-11-07 NOTE — Progress Notes (Signed)
PHARMACY - PHYSICIAN COMMUNICATION CRITICAL VALUE ALERT - BLOOD CULTURE IDENTIFICATION (BCID)  Victoria Walsh is an 86 y.o. female who presented to [facility] on 11/14/2019 with a chief complaint of fever/confusion.   Assessment: severe sepsis secondary to UTI. Urine cultures with > 100K colonies/mL Klebsiella pneumoniae and Proteus mirabilis. Blood cultures 4/4 bottles with Klebsiella pneumoniae. Now 1/4 bottles also positive for Proteus mirabilis.   Name of physician (or Provider) Contacted: Dr. Maylene Roes  Current antibiotics: none  Changes to prescribed antibiotics recommended:  No changes-comfort measures only   Results for orders placed or performed during the hospital encounter of 11/02/2019  Blood Culture ID Panel (Reflexed) (Collected: 11/06/2019 12:39 PM)  Result Value Ref Range   Enterococcus species NOT DETECTED NOT DETECTED   Listeria monocytogenes NOT DETECTED NOT DETECTED   Staphylococcus species NOT DETECTED NOT DETECTED   Staphylococcus aureus (BCID) NOT DETECTED NOT DETECTED   Streptococcus species NOT DETECTED NOT DETECTED   Streptococcus agalactiae NOT DETECTED NOT DETECTED   Streptococcus pneumoniae NOT DETECTED NOT DETECTED   Streptococcus pyogenes NOT DETECTED NOT DETECTED   Acinetobacter baumannii NOT DETECTED NOT DETECTED   Enterobacteriaceae species DETECTED (A) NOT DETECTED   Enterobacter cloacae complex NOT DETECTED NOT DETECTED   Escherichia coli NOT DETECTED NOT DETECTED   Klebsiella oxytoca NOT DETECTED NOT DETECTED   Klebsiella pneumoniae DETECTED (A) NOT DETECTED   Proteus species NOT DETECTED NOT DETECTED   Serratia marcescens NOT DETECTED NOT DETECTED   Carbapenem resistance NOT DETECTED NOT DETECTED   Haemophilus influenzae NOT DETECTED NOT DETECTED   Neisseria meningitidis NOT DETECTED NOT DETECTED   Pseudomonas aeruginosa NOT DETECTED NOT DETECTED   Candida albicans NOT DETECTED NOT DETECTED   Candida glabrata NOT DETECTED NOT DETECTED   Candida krusei  NOT DETECTED NOT DETECTED   Candida parapsilosis NOT DETECTED NOT DETECTED   Candida tropicalis NOT DETECTED NOT DETECTED    [name] 11/07/2019  10:46 AM

## 2019-11-08 LAB — CULTURE, BLOOD (ROUTINE X 2): Special Requests: ADEQUATE

## 2019-11-08 MED ORDER — LORAZEPAM 2 MG/ML IJ SOLN: 0.5000 mg | INTRAMUSCULAR | Status: DC | PRN

## 2019-11-08 MED ORDER — HYDROMORPHONE HCL 1 MG/ML IJ SOLN: 0.5000 mg | INTRAMUSCULAR | Status: DC | PRN

## 2019-11-08 MED ORDER — HYDROMORPHONE HCL 1 MG/ML IJ SOLN
0.5000 mg | INTRAMUSCULAR | Status: DC | PRN
  Administered 2019-11-08 (×3): 0.5 mg via INTRAVENOUS
  Filled 2019-11-08 (×3): qty 0.5

## 2019-11-08 MED ORDER — SCOPOLAMINE 1 MG/3DAYS TD PT72
1.0000 | MEDICATED_PATCH | TRANSDERMAL | Status: DC
  Administered 2019-11-08: 14:00:00 1.5 mg via TRANSDERMAL
  Filled 2019-11-08 (×2): qty 1

## 2019-11-09 LAB — CULTURE, BLOOD (ROUTINE X 2): Special Requests: ADEQUATE

## 2019-11-15 NOTE — Progress Notes (Signed)
PROGRESS NOTE    Victoria Walsh  [id number] DOB: 1932/03/14 DOA: 10/21/2019 PCP: Reymundo Poll, MD     Brief Narrative:  Victoria Walsh is an 86 y.o. female with a history of dementia, CKD, CVA, chronic foley catheterization and recurrent UTIs who presented from memory care facility for worsening confusion, appearing lethargic on arrival. She was septic with fever, tachycardia, hypotension, tachypnea, leukocytosis and with evidence of UTI with AKI (Cr 3.03). CT abdomen demonstrated interval migration of a right renal pelvis stone to the UPJ w/hydronephrosis as well as increased left ureter caliber associated with multiple stones. Cultures were drawn, broad spectrum antibiotics were started and IV fluids were given. This did not improve hypotension or lethargy. Urology was consulted, though after goals of care discussions, family has confirmed their desire to proceed with comfort measures only which has been implemented. Due to severe vital sign instability, transfer to residential hospice is not felt to be feasible. Inpatient demise is anticipated.  New events last 24 hours / Subjective: Family at bedside states that patient has been lethargic, respirations more labored this morning.  Also with oral secretions and "gurgling"  Assessment & Plan:   Principal Problem:   Severe sepsis ([place]) Active Problems:   Acute lower UTI   AKI (acute kidney injury) ([place])   Dementia with behavioral disturbance (HCC)   Hypothyroidism   Chronic diastolic CHF (congestive heart failure) (HCC)   Essential hypertension   PAF (paroxysmal atrial fibrillation) (HCC)   Sepsis secondary to UTI ([place])   Acute metabolic encephalopathy   Hydronephrosis due to obstruction of ureter   DNR (do not resuscitate)   Goals of care, counseling/discussion   Comfort measures only status   Obstructive uropathy   CKD (chronic kidney disease) stage 3, GFR 30-59 ml/min   Chronic indwelling Foley catheter   Leukocytosis   Macrocytic  anemia   Bacteremia due to Klebsiella pneumoniae   Bacterial infection due to Proteus mirabilis CKD 3b    Continue comfort measures.  Morphine for air hunger/dyspnea, add scopolamine patch for oral secretions.  Anticipate in-hospital death.   Code Status: DNR Family Communication: Family at bedside Disposition Plan:  . Patient is from memory care prior to admission. . Inpatient death anticipated.    Consultants:   Urology    Antimicrobials:  Anti-infectives (From admission, onward)   Start     Dose/Rate Route Frequency Ordered Stop   11/07/19 1200  vancomycin (VANCOCIN) IVPB 1000 mg/200 mL premix  Status:  Discontinued     1,000 mg 200 mL/hr over 60 Minutes Intravenous Every 48 hours 11/07/2019 1735 11/11/2019 2201   11/06/19 1300  ceFEPIme (MAXIPIME) 2 g in sodium chloride 0.9 % 100 mL IVPB  Status:  Discontinued     2 g 200 mL/hr over 30 Minutes Intravenous Every 24 hours 11/11/2019 1735 11/04/2019 2201   10/26/2019 1300  ceFEPIme (MAXIPIME) 2 g in sodium chloride 0.9 % 100 mL IVPB     2 g 200 mL/hr over 30 Minutes Intravenous  Once 10/26/2019 1240 11/03/2019 1336   10/19/2019 1245  metroNIDAZOLE (FLAGYL) IVPB 500 mg     500 mg 100 mL/hr over 60 Minutes Intravenous  Once 11/06/2019 1240 11/07/2019 1421   11/02/2019 1245  vancomycin (VANCOCIN) IVPB 1000 mg/200 mL premix     1,000 mg 200 mL/hr over 60 Minutes Intravenous  Once 10/27/2019 1240 10/20/2019 1416       Objective: Vitals:   11/06/19 1016 11/06/19 2221 11/07/19 1111 11/07/19 2232  BP: (!) 63/18 Marland Kitchen)  119/101 96/63 115/70  Pulse: 97 (!) 121 80 (!) 148  Resp: 14 18 20 18   Temp: 99.8 F (37.7 C)  99.2 F (37.3 C) 97.7 F (36.5 C)  TempSrc: Axillary  Oral Oral  SpO2: (!) 89%  95% 94%  Weight:      Height:        Intake/Output Summary (Last 24 hours) at 12/08/19 1031 Last data filed at 2019-12-08 0846 Gross per 24 hour  Intake 0 ml  Output 775 ml  Net -775 ml   Filed Weights   11/01/2019 1841  Weight: 80.7 kg     Examination: General exam: Appears calm and comfortable  Respiratory system: Increased respiratory effort   Data Reviewed: I have personally reviewed following labs and imaging studies  CBC: Recent Labs  Lab 10/28/2019 1239 11/04/2019 2029  WBC 20.8* 17.2*  NEUTROABS 19.2* 15.7*  HGB 9.3* 6.6*  HCT 32.3* 22.7*  MCV 106.3* 103.7*  PLT 158 [id number]*   Basic Metabolic Panel: Recent Labs  Lab 11/11/2019 1239 10/30/2019 2029  NA 145 145  K 3.4* 3.9  CL 107 117*  CO2 21* 18*  GLUCOSE 116* 148*  BUN 48* 44*  CREATININE 3.03* 2.53*  CALCIUM 8.7* 7.1*   GFR: Estimated Creatinine Clearance: 16.7 mL/min (A) (by C-G formula based on SCr of 2.53 mg/dL (H)). Liver Function Tests: Recent Labs  Lab 11/11/2019 1239  AST 28  ALT 17  ALKPHOS 77  BILITOT 0.8  PROT 6.6  ALBUMIN 3.4*   Recent Labs  Lab 10/19/2019 1239  LIPASE 16   No results for input(s): AMMONIA in the last 168 hours. Coagulation Profile: Recent Labs  Lab 10/25/2019 1239  INR 2.4*   Cardiac Enzymes: No results for input(s): CKTOTAL, CKMB, CKMBINDEX, TROPONINI in the last 168 hours. BNP (last 3 results) No results for input(s): PROBNP in the last 8760 hours. HbA1C: No results for input(s): HGBA1C in the last 72 hours. CBG: No results for input(s): GLUCAP in the last 168 hours. Lipid Profile: No results for input(s): CHOL, HDL, LDLCALC, TRIG, CHOLHDL, LDLDIRECT in the last 72 hours. Thyroid Function Tests: No results for input(s): TSH, T4TOTAL, FREET4, T3FREE, THYROIDAB in the last 72 hours. Anemia Panel: No results for input(s): VITAMINB12, FOLATE, FERRITIN, TIBC, IRON, RETICCTPCT in the last 72 hours. Sepsis Labs: Recent Labs  Lab 10/19/2019 1239 11/07/2019 2029  LATICACIDVEN 1.9 1.6    Recent Results (from the past 240 hour(s))  Blood Culture (routine x 2)     Status: Abnormal (Preliminary result)   Collection Time: 10/27/2019 12:39 PM   Specimen: BLOOD  Result Value Ref Range Status   Specimen  Description   Final    BLOOD RIGHT ANTECUBITAL Performed at [facility] [address] [place], [place] [address]    Special Requests   Final    BOTTLES DRAWN AEROBIC AND ANAEROBIC Blood Culture adequate volume Performed at [facility] [address], [place], [place] [address]    Culture  Setup Time   Final    IN BOTH AEROBIC AND ANAEROBIC BOTTLES GRAM NEGATIVE RODS Organism ID to follow CRITICAL RESULT CALLED TO, READ BACK BY AND VERIFIED WITH: Lavell Luster [facility] 11/06/19 0123 JDW Performed at [facility], [place] [address] [place], [place] [address]    Culture KLEBSIELLA PNEUMONIAE (A)  Final   Report Status PENDING  Incomplete   Organism ID, Bacteria KLEBSIELLA PNEUMONIAE  Final      Susceptibility   Klebsiella pneumoniae -  MIC*    AMPICILLIN >=32 RESISTANT Resistant     CEFAZOLIN <=4 SENSITIVE Sensitive     CEFEPIME <=0.12 SENSITIVE Sensitive     CEFTAZIDIME <=1 SENSITIVE Sensitive     CEFTRIAXONE <=0.25 SENSITIVE Sensitive     CIPROFLOXACIN <=0.25 SENSITIVE Sensitive     GENTAMICIN <=1 SENSITIVE Sensitive     IMIPENEM <=0.25 SENSITIVE Sensitive     TRIMETH/SULFA <=20 SENSITIVE Sensitive     AMPICILLIN/SULBACTAM 4 SENSITIVE Sensitive     PIP/TAZO <=4 SENSITIVE Sensitive     * KLEBSIELLA PNEUMONIAE  Urine culture     Status: Abnormal   Collection Time: 11/04/2019 12:39 PM   Specimen: In/Out Cath Urine  Result Value Ref Range Status   Specimen Description   Final    IN/OUT CATH URINE Performed at [facility] [address] [place], [place] [address]    Special Requests   Final    NONE Performed at [facility], [place] [address], [place],  [address]    Culture (A)  Final    >=100,000 COLONIES/mL KLEBSIELLA PNEUMONIAE >=100,000 COLONIES/mL PROTEUS MIRABILIS    Report Status 11/07/2019 FINAL  Final   Organism ID, Bacteria KLEBSIELLA PNEUMONIAE (A)  Final   Organism ID, Bacteria  PROTEUS MIRABILIS (A)  Final      Susceptibility   Klebsiella pneumoniae - MIC*    AMPICILLIN >=32 RESISTANT Resistant     CEFAZOLIN <=4 SENSITIVE Sensitive     CEFTRIAXONE <=0.25 SENSITIVE Sensitive     CIPROFLOXACIN <=0.25 SENSITIVE Sensitive     GENTAMICIN <=1 SENSITIVE Sensitive     IMIPENEM <=0.25 SENSITIVE Sensitive     NITROFURANTOIN 64 INTERMEDIATE Intermediate     TRIMETH/SULFA <=20 SENSITIVE Sensitive     AMPICILLIN/SULBACTAM 4 SENSITIVE Sensitive     PIP/TAZO <=4 SENSITIVE Sensitive     * >=100,000 COLONIES/mL KLEBSIELLA PNEUMONIAE   Proteus mirabilis - MIC*    AMPICILLIN <=2 SENSITIVE Sensitive     CEFAZOLIN <=4 SENSITIVE Sensitive     CEFTRIAXONE <=0.25 SENSITIVE Sensitive     CIPROFLOXACIN <=0.25 SENSITIVE Sensitive     GENTAMICIN <=1 SENSITIVE Sensitive     IMIPENEM 1 SENSITIVE Sensitive     NITROFURANTOIN 128 RESISTANT Resistant     TRIMETH/SULFA <=20 SENSITIVE Sensitive     AMPICILLIN/SULBACTAM <=2 SENSITIVE Sensitive     PIP/TAZO <=4 SENSITIVE Sensitive     * >=100,000 COLONIES/mL PROTEUS MIRABILIS  Blood Culture ID Panel (Reflexed)     Status: Abnormal   Collection Time: 11/13/2019 12:39 PM  Result Value Ref Range Status   Enterococcus species NOT DETECTED NOT DETECTED Final   Listeria monocytogenes NOT DETECTED NOT DETECTED Final   Staphylococcus species NOT DETECTED NOT DETECTED Final   Staphylococcus aureus (BCID) NOT DETECTED NOT DETECTED Final   Streptococcus species NOT DETECTED NOT DETECTED Final   Streptococcus agalactiae NOT DETECTED NOT DETECTED Final   Streptococcus pneumoniae NOT DETECTED NOT DETECTED Final   Streptococcus pyogenes NOT DETECTED NOT DETECTED Final   Acinetobacter baumannii NOT DETECTED NOT DETECTED Final   Enterobacteriaceae species DETECTED (A) NOT DETECTED Final    Comment: Enterobacteriaceae represent a large family of gram-negative bacteria, not a single organism. CRITICAL RESULT CALLED TO, READ BACK BY AND VERIFIED WITH: J  GRIMSLEY PHARMD 11/06/19 0123 JDW    Enterobacter cloacae complex NOT DETECTED NOT DETECTED Final   Escherichia coli NOT DETECTED NOT DETECTED Final   Klebsiella oxytoca NOT DETECTED NOT DETECTED Final   Klebsiella pneumoniae DETECTED (A)  NOT DETECTED Final    Comment: CRITICAL RESULT CALLED TO, READ BACK BY AND VERIFIED WITH: Lavell Luster [facility] 11/06/19 0123 JDW    Proteus species NOT DETECTED NOT DETECTED Final   Serratia marcescens NOT DETECTED NOT DETECTED Final   Carbapenem resistance NOT DETECTED NOT DETECTED Final   Haemophilus influenzae NOT DETECTED NOT DETECTED Final   Neisseria meningitidis NOT DETECTED NOT DETECTED Final   Pseudomonas aeruginosa NOT DETECTED NOT DETECTED Final   Candida albicans NOT DETECTED NOT DETECTED Final   Candida glabrata NOT DETECTED NOT DETECTED Final   Candida krusei NOT DETECTED NOT DETECTED Final   Candida parapsilosis NOT DETECTED NOT DETECTED Final   Candida tropicalis NOT DETECTED NOT DETECTED Final    Comment: Performed at [facility], [place] [address] [place], [place] [address]  Blood Culture (routine x 2)     Status: Abnormal   Collection Time: 10/31/2019  1:05 PM   Specimen: BLOOD  Result Value Ref Range Status   Specimen Description   Final    BLOOD RIGHT HAND Performed at [facility] [address], [place], [place] [address]    Special Requests   Final    BOTTLES DRAWN AEROBIC AND ANAEROBIC Blood Culture adequate volume Performed at [facility] [address], [place], [place] [address]    Culture  Setup Time   Final    IN BOTH AEROBIC AND ANAEROBIC BOTTLES GRAM NEGATIVE RODS CRITICAL VALUE NOTED.  VALUE IS CONSISTENT WITH PREVIOUSLY REPORTED AND CALLED VALUE. Performed at [facility], [place] [address], [place], [place] [address]    Culture (A)  Final    PROTEUS MIRABILIS KLEBSIELLA PNEUMONIAE CRITICAL RESULT CALLED TO, READ BACK BY AND VERIFIED WITH: PHARMD MEREDITH R.  [id number]  [id number] [facility] SUSCEPTIBILITIES PERFORMED ON PREVIOUS CULTURE WITHIN THE LAST 5 DAYS. FOR ORG KLEBSIELLA PNEUMONIAE    Report Status 30-Nov-2019 FINAL  Final   Organism ID, Bacteria PROTEUS MIRABILIS  Final      Susceptibility   Proteus mirabilis - MIC*    AMPICILLIN <=2 SENSITIVE Sensitive     CEFAZOLIN <=4 SENSITIVE Sensitive     CEFEPIME <=0.12 SENSITIVE Sensitive     CEFTAZIDIME <=1 SENSITIVE Sensitive     CEFTRIAXONE <=0.25 SENSITIVE Sensitive     CIPROFLOXACIN <=0.25 SENSITIVE Sensitive     GENTAMICIN <=1 SENSITIVE Sensitive     IMIPENEM 1 SENSITIVE Sensitive     TRIMETH/SULFA <=20 SENSITIVE Sensitive     AMPICILLIN/SULBACTAM <=2 SENSITIVE Sensitive     PIP/TAZO <=4 SENSITIVE Sensitive     * PROTEUS MIRABILIS  Respiratory Panel by RT PCR (Flu A&B, Covid) - Nasopharyngeal Swab     Status: None   Collection Time: 11/14/2019  2:28 PM   Specimen: Nasopharyngeal Swab  Result Value Ref Range Status   SARS Coronavirus 2 by RT PCR NEGATIVE NEGATIVE Final    Comment: (NOTE) SARS-CoV-2 target nucleic acids are NOT DETECTED. The SARS-CoV-2 RNA is generally detectable in upper respiratoy specimens during the acute phase of infection. The lowest concentration of SARS-CoV-2 viral copies this assay can detect is 131 copies/mL. A negative result does not preclude SARS-Cov-2 infection and should not be used as the sole basis for treatment or other patient management decisions. A negative result may occur with  improper specimen collection/handling, submission of specimen other than nasopharyngeal swab, presence of viral mutation(s) within the areas targeted by this assay, and inadequate number of viral copies (<131 copies/mL). A negative result  must be combined with clinical observations, patient history, and epidemiological information. The expected result is Negative. Fact Sheet for Patients:  PinkCheek.be Fact Sheet for Healthcare Providers:   GravelBags.it This test is not yet ap proved or cleared by the [place] FDA and  has been authorized for detection and/or diagnosis of SARS-CoV-2 by FDA under an Emergency Use Authorization (EUA). This EUA will remain  in effect (meaning this test can be used) for the duration of the COVID-19 declaration under Section 564(b)(1) of the Act, 21 U.S.C. section 360bbb-3(b)(1), unless the authorization is terminated or revoked sooner.    Influenza A by PCR NEGATIVE NEGATIVE Final   Influenza B by PCR NEGATIVE NEGATIVE Final    Comment: (NOTE) The Xpert Xpress SARS-CoV-2/FLU/RSV assay is intended as an aid in  the diagnosis of influenza from Nasopharyngeal swab specimens and  should not be used as a sole basis for treatment. Nasal washings and  aspirates are unacceptable for Xpert Xpress SARS-CoV-2/FLU/RSV  testing. Fact Sheet for Patients: PinkCheek.be Fact Sheet for Healthcare Providers: GravelBags.it This test is not yet approved or cleared by the [place] FDA and  has been authorized for detection and/or diagnosis of SARS-CoV-2 by  FDA under an Emergency Use Authorization (EUA). This EUA will remain  in effect (meaning this test can be used) for the duration of the  Covid-19 declaration under Section 564(b)(1) of the Act, 21  U.S.C. section 360bbb-3(b)(1), unless the authorization is  terminated or revoked. Performed at [facility], [place] [address], [place], [place] [address]   MRSA PCR Screening     Status: Abnormal   Collection Time: 11/11/2019  6:48 PM   Specimen: Nasal Mucosa; Nasopharyngeal  Result Value Ref Range Status   MRSA by PCR POSITIVE (A) NEGATIVE Final    Comment:        The GeneXpert MRSA Assay (FDA approved for NASAL specimens only), is one component of a comprehensive MRSA colonization surveillance program. It is not intended to diagnose  MRSA infection nor to guide or monitor treatment for MRSA infections. RESULT CALLED TO, READ BACK BY AND VERIFIED WITH: M.WATSON AT 2301 ON 10/25/2019 BY N.THOMPSON Performed at [facility], [place] [address] [place], [place] [address]       Radiology Studies: No results found.    Scheduled Meds: . scopolamine  1 patch Transdermal Q72H   Continuous Infusions:   LOS: 3 days      Time spent: 25 minutes   Dessa Phi, DO [facility] 2019/11/28, 10:31 AM   Available via Epic secure chat 7am-7pm After these hours, please refer to coverage provider listed on [email or web address]

## 2019-11-15 NOTE — Plan of Care (Signed)
  Problem: Clinical Measurements: Goal: Respiratory complications will improve Outcome: Not Progressing Goal: Cardiovascular complication will be avoided Outcome: Not Progressing   Problem: Activity: Goal: Risk for activity intolerance will decrease Outcome: Not Progressing   Problem: Nutrition: Goal: Adequate nutrition will be maintained Outcome: Not Progressing   Problem: Coping: Goal: Level of anxiety will decrease Outcome: Not Progressing   Problem: Elimination: Goal: Will not experience complications related to bowel motility Outcome: Not Progressing Goal: Will not experience complications related to urinary retention Outcome: Not Progressing   Problem: Pain Managment: Goal: General experience of comfort will improve Outcome: Not Progressing   Problem: Safety: Goal: Ability to remain free from injury will improve Outcome: Not Progressing   Problem: Skin Integrity: Goal: Risk for impaired skin integrity will decrease Outcome: Not Progressing

## 2019-11-15 NOTE — Progress Notes (Signed)
Patient was pulseless and breathless at 39, witnessed by daughter in law and son. I, Gerilyn Nestle was notified and listened  And asked Loma Sousa to double verify the patients passing. MD was made aware.

## 2019-11-15 NOTE — Plan of Care (Signed)
  Problem: Clinical Measurements: Goal: Will remain free from infection Outcome: Not Applicable   Problem: Coping: Goal: Level of anxiety will decrease 11-22-2019 2031 by Hubert Azure, RN Outcome: Not Applicable [id number] by Hubert Azure, RN Outcome: Not Progressing   Problem: Elimination: Goal: Will not experience complications related to bowel motility 2019/11/22 2031 by Hubert Azure, RN Outcome: Not Applicable [id number] by Hubert Azure, RN Outcome: Not Progressing   Problem: Elimination: Goal: Will not experience complications related to urinary retention November 22, 2019 2031 by Hubert Azure, RN Outcome: Not Applicable [id number] by Hubert Azure, RN Outcome: Not Progressing   Problem: Pain Managment: Goal: General experience of comfort will improve November 22, 2019 2031 by Hubert Azure, RN Outcome: Not Applicable [id number] by Hubert Azure, RN Outcome: Not Progressing   Problem: Safety: Goal: Ability to remain free from injury will improve 11/22/19 2031 by Hubert Azure, RN Outcome: Not Applicable [id number] by Hubert Azure, RN Outcome: Not Progressing   Problem: Skin Integrity: Goal: Risk for impaired skin integrity will decrease 22-Nov-2019 2031 by Hubert Azure, RN Outcome: Not Applicable [id number] by Hubert Azure, RN Outcome: Not Progressing

## 2019-11-15 NOTE — Care Management Important Message (Signed)
Important Message  Patient Details IM Letter given to Velva Harman RN Case Manager to present to the Patient Name: Victoria Walsh MRN: [id number] Date of Birth: 04-27-1932   Medicare Important Message Given:  Yes     Kerin Salen 2019-11-24, 1:43 PM

## 2019-11-15 NOTE — Death Summary Note (Signed)
DEATH SUMMARY   Patient Details  Name: Victoria Walsh MRN: [id number] DOB: 1931/08/28  Admission/Discharge Information   Admit Date:  11-24-2019  Date of Death: Date of Death: 11-27-19  Time of Death: Time of Death: 54  Length of Stay: 3  Referring Physician: Reymundo Poll, MD   Reason(s) for Hospitalization  Severe sepsis secondary to klebsiella and proteus bacteremia, with end organ damage  Diagnoses  Preliminary cause of death:  Severe sepsis secondary to klebsiella and proteus bacteremia, with end organ damage Secondary Diagnoses (including complications and co-morbidities):  Principal Problem:   Severe sepsis ([place]) Active Problems:   Acute lower UTI   AKI (acute kidney injury) ([place])   Dementia with behavioral disturbance ([place])   Hypothyroidism   Chronic diastolic CHF (congestive heart failure) ([place])   Essential hypertension   PAF (paroxysmal atrial fibrillation) ([place])   Sepsis secondary to UTI ([place])   Acute metabolic encephalopathy   Hydronephrosis due to obstruction of ureter   DNR (do not resuscitate)   Goals of care, counseling/discussion   Comfort measures only status   Obstructive uropathy   CKD (chronic kidney disease) stage 3, GFR 30-59 ml/min   Chronic indwelling Foley catheter   Leukocytosis   Macrocytic anemia   Bacteremia due to Klebsiella pneumoniae   Bacterial infection due to Proteus mirabilis   Brief Hospital Course (including significant findings, care, treatment, and services provided and events leading to death)  Victoria Walsh an55 y.o.femalewith a history of dementia, CKD, CVA, chronic foley catheterization and recurrent UTIs who presented from memory care facility for worsening confusion, appearing lethargic on arrival. She was septic with fever, tachycardia, hypotension, tachypnea, leukocytosis and with evidence of UTI with AKI (Cr 3.03). CT abdomen demonstrated interval migration of a right renal pelvis stone to the UPJ w/hydronephrosis as  well as increased left ureter caliber associated with multiple stones. Cultures were drawn, broad spectrum antibiotics were started and IV fluids were given. This did not improve hypotension or lethargy. Urology was consulted, though after goals of care discussions, family has confirmed their desire to proceed with comfort measures only which has been implemented. Patient passed away November 27, 2022 at 1900.    Pertinent Labs and Studies  Significant Diagnostic Studies CT Abdomen Pelvis Wo Contrast  Result Date: 2019-11-24 CLINICAL DATA:  Abdominal pain fever. EXAM: CT ABDOMEN AND PELVIS WITHOUT CONTRAST TECHNIQUE: Multidetector CT imaging of the abdomen and pelvis was performed following the standard protocol without IV contrast. COMPARISON:  10/10/2019 FINDINGS: Lower chest: Small right pleural effusion versus thickening. Unchanged appearance of 4 mm right middle lobe lung nodule. Hepatobiliary: The 8 mm low-attenuation structure in lateral segment of left lobe of liver is unchanged and too small to reliably characterize gallbladder unremarkable. No biliary dilatation. Pancreas: Fatty replacement of the pancreas. No main duct dilatation, inflammation or mass. Spleen: Normal in size without focal abnormality. Adrenals/Urinary Tract: Normal appearance of the adrenal glands. There is a large cyst within upper pole of right kidney measuring 6.3 cm. Incompletely characterized without IV contrast. Similarly 2.3 cm cyst within upper pole of left kidney is also incompletely characterized without IV contrast. New right-sided hydronephrosis. Previously noted stone within the right renal pelvis has migrated to the right UPJ. This has a long axis dimension of 1.3 cm, image 39/8. Poorly defined calcifications within the left renal collecting system again noted without hydronephrosis. The distal left ureter is increased in caliber containing multiple (approximately 5 stones measuring up to 7 mm in long axis, image 50/8. The bladder  is collapsed around a Foley catheter. Calcification within the right-side of the bladder measures 3 mm. Stomach/Bowel: Stomach is within normal limits. Appendix appears normal. No evidence of bowel wall thickening, distention, or inflammatory changes. Vascular/Lymphatic: Aortic atherosclerosis. No aneurysm. No abdominopelvic adenopathy. Reproductive: Status post hysterectomy. Right adnexal mass measures 7.0 x 5.5 cm, image 72/3. Unchanged in size from previous exam. Other: No ascites or fluid collections within the abdomen or pelvis. Musculoskeletal: Previous ORIF left femur. Mild scoliosis and degenerative disc disease. IMPRESSION: 1. Interval migration of right renal pelvis stone to the right UPJ. There is new right-sided hydronephrosis. 2. Increase caliber of the distal left ureter which now contains multiple stones measuring up to 7 mm in long axis. 3. Stable right adnexal cyst mass. This would be better assessed with pelvic sonogram. 4. Small right pleural effusion versus thickening. 5. Bilateral kidney cysts. Incompletely characterized without IV contrast. 6. Small right-side of bladder calculus measuring 3 mm. 7. Stable low-attenuation structure within lateral segment of left lobe of liver is too small to reliably characterize but favored to represent a benign abnormality. 8. Stable appearance of 4 mm right middle lobe lung nodule. This is favored to represent a benign abnormality. No follow-up needed if patient is low-risk. Non-contrast chest CT can be considered in 12 months if patient is high-risk. This recommendation follows the consensus statement: Guidelines for Management of Incidental Pulmonary Nodules Detected on CT Images: From the Fleischner Society 2017; Radiology 2017; 284:228-243. Aortic Atherosclerosis (ICD10-I70.0). Electronically Signed   By: Kerby Moors M.D.   On: 10/23/2019 14:22   CT Head Wo Contrast  Result Date: 10/30/2019 CLINICAL DATA:  Altered mental status. EXAM: CT HEAD  WITHOUT CONTRAST TECHNIQUE: Contiguous axial images were obtained from the base of the skull through the vertex without intravenous contrast. COMPARISON:  April 26, 2019. FINDINGS: Brain: Mild diffuse cortical atrophy is noted. Mild chronic ischemic white matter disease is noted. Right parietal encephalomalacia is noted consistent with old infarction. Old right occipital infarction is noted. No mass effect or midline shift is noted. Ventricular size is within normal limits. There is no evidence of mass lesion, hemorrhage or acute infarction. Vascular: No hyperdense vessel or unexpected calcification. Skull: Normal. Negative for fracture or focal lesion. Sinuses/Orbits: No acute finding. Other: None. IMPRESSION: Mild diffuse cortical atrophy. Mild chronic ischemic white matter disease. 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 No acute intracranial abnormality seen. Electronically Signed   By: Marijo Conception M.D.   On: 11/04/2019 14:17   DG Chest Port 1 View  Result Date: 10/16/2019 CLINICAL DATA:  Rule out infection. Additional history provided: Patient coming from the EMS for sepsis, altered this morning, temperature 103.5 EXAM: PORTABLE CHEST 1 VIEW COMPARISON:  Chest radiograph 09/21/2019 FINDINGS: Heart size at the upper limits of normal. No evidence of airspace consolidation within the lungs. No evidence of pleural effusion or pneumothorax. No acute bony abnormality. IMPRESSION: No evidence of acute cardiopulmonary abnormality. Heart size at the upper limits of normal. Electronically Signed   By: Kellie Simmering DO   On: 10/20/2019 13:36   CT Renal Stone Study  Result Date: 10/10/2019 CLINICAL DATA:  Hematuria. Passing blood clots in Foley bag. Nonambulatory. EXAM: CT ABDOMEN AND PELVIS WITHOUT CONTRAST TECHNIQUE: Multidetector CT imaging of the abdomen and pelvis was performed following the standard protocol without IV contrast. COMPARISON:  None. FINDINGS: Lower chest: Trace bilateral pleural  effusions with mild dependent atelectasis at both lung bases. The heart is mildly enlarged. There is no  significant pericardial fluid. Atherosclerosis of the aorta and coronary arteries noted. Hepatobiliary: Small cysts in the left hepatic lobe. No suspicious hepatic findings on noncontrast imaging. The gallbladder is mildly distended with possible dependent sludge. No evidence of gallbladder wall thickening, surrounding inflammation or significant biliary dilatation. Pancreas: Diffuse atrophy and fatty replacement. No evidence of pancreatic ductal dilatation or surrounding inflammation. Spleen: Normal in size without focal abnormality. Adrenals/Urinary Tract: Both adrenal glands appear normal. There are calculi in the renal pelves bilaterally. There is a discrete calculus in the right renal pelvis measuring up to 7 mm on image 34/2. There are less well-defined calcifications within the left renal pelvis and infundibula. There is no ureteral calculus, hydronephrosis or perinephric soft tissue stranding. A 6.6 cm cyst is present in the upper pole of the right kidney. There is a possible smaller cyst in the upper pole of the left kidney. The bladder is decompressed by Foley catheter. A small right-sided bladder calculus is noted (image 80/2). Stomach/Bowel: No evidence of bowel wall thickening, distention or surrounding inflammatory change. The appendix appears normal. Mild diverticular changes within the distal colon. Vascular/Lymphatic: There are no enlarged abdominal or pelvic lymph nodes. Diffuse aortic and branch vessel atherosclerosis. No acute vascular findings on noncontrast imaging. Reproductive: Hysterectomy. Well-circumscribed right adnexal mass measures near water density and 7.5 x 5.2 cm on image 68/2. No suspicious left adnexal findings. Other: No evidence of abdominal wall mass or hernia. No ascites. Musculoskeletal: No acute or significant osseous findings. There are degenerative changes throughout the  spine associated with a convex left thoracolumbar scoliosis. Previous proximal left femoral ORIF. IMPRESSION: 1. 7 mm calculus in the right renal pelvis. There are less well-defined calcifications within the left renal pelvis and infundibula. No evidence of ureteral calculus, hydronephrosis or perinephric soft tissue stranding. 2. Small bladder calculus. 3. Well-circumscribed right adnexal mass measuring up to 7.5 cm in diameter is likely cystic but incompletely evaluated by this noncontrast study. This could be further evaluated with pelvic ultrasound. 4. Trace bilateral pleural effusions with mild dependent atelectasis at both lung bases. Aortic Atherosclerosis (ICD10-I70.0). Electronically Signed   By: Richardean Sale M.D.   On: 10/10/2019 19:07    Microbiology Recent Results (from the past 240 hour(s))  Blood Culture (routine x 2)     Status: Abnormal (Preliminary result)   Collection Time: 10/26/2019 12:39 PM   Specimen: BLOOD  Result Value Ref Range Status   Specimen Description   Final    BLOOD RIGHT ANTECUBITAL Performed at [facility] [address] [place], [place] [address]    Special Requests   Final    BOTTLES DRAWN AEROBIC AND ANAEROBIC Blood Culture adequate volume Performed at [facility] [address], [place], [place] [address]    Culture  Setup Time   Final    IN BOTH AEROBIC AND ANAEROBIC BOTTLES GRAM NEGATIVE RODS Organism ID to follow CRITICAL RESULT CALLED TO, READ BACK BY AND VERIFIED WITH: Lavell Luster [facility] 11/06/19 0123 JDW Performed at [facility], [place] [address] [place], [place] [address]    Culture KLEBSIELLA PNEUMONIAE (A)  Final   Report Status PENDING  Incomplete   Organism ID, Bacteria KLEBSIELLA PNEUMONIAE  Final      Susceptibility   Klebsiella pneumoniae - MIC*    AMPICILLIN >=32 RESISTANT Resistant     CEFAZOLIN <=4 SENSITIVE Sensitive     CEFEPIME <=0.12 SENSITIVE Sensitive     CEFTAZIDIME <=1  SENSITIVE Sensitive  CEFTRIAXONE <=0.25 SENSITIVE Sensitive     CIPROFLOXACIN <=0.25 SENSITIVE Sensitive     GENTAMICIN <=1 SENSITIVE Sensitive     IMIPENEM <=0.25 SENSITIVE Sensitive     TRIMETH/SULFA <=20 SENSITIVE Sensitive     AMPICILLIN/SULBACTAM 4 SENSITIVE Sensitive     PIP/TAZO <=4 SENSITIVE Sensitive     * KLEBSIELLA PNEUMONIAE  Urine culture     Status: Abnormal   Collection Time: 10/15/2019 12:39 PM   Specimen: In/Out Cath Urine  Result Value Ref Range Status   Specimen Description   Final    IN/OUT CATH URINE Performed at [facility] [address], [place], [place] [address]    Special Requests   Final    NONE Performed at [facility], [place] [address], [place], [place] [address]    Culture (A)  Final    >=100,000 COLONIES/mL KLEBSIELLA PNEUMONIAE >=100,000 COLONIES/mL PROTEUS MIRABILIS    Report Status 11/07/2019 FINAL  Final   Organism ID, Bacteria KLEBSIELLA PNEUMONIAE (A)  Final   Organism ID, Bacteria PROTEUS MIRABILIS (A)  Final      Susceptibility   Klebsiella pneumoniae - MIC*    AMPICILLIN >=32 RESISTANT Resistant     CEFAZOLIN <=4 SENSITIVE Sensitive     CEFTRIAXONE <=0.25 SENSITIVE Sensitive     CIPROFLOXACIN <=0.25 SENSITIVE Sensitive     GENTAMICIN <=1 SENSITIVE Sensitive     IMIPENEM <=0.25 SENSITIVE Sensitive     NITROFURANTOIN 64 INTERMEDIATE Intermediate     TRIMETH/SULFA <=20 SENSITIVE Sensitive     AMPICILLIN/SULBACTAM 4 SENSITIVE Sensitive     PIP/TAZO <=4 SENSITIVE Sensitive     * >=100,000 COLONIES/mL KLEBSIELLA PNEUMONIAE   Proteus mirabilis - MIC*    AMPICILLIN <=2 SENSITIVE Sensitive     CEFAZOLIN <=4 SENSITIVE Sensitive     CEFTRIAXONE <=0.25 SENSITIVE Sensitive     CIPROFLOXACIN <=0.25 SENSITIVE Sensitive     GENTAMICIN <=1 SENSITIVE Sensitive     IMIPENEM 1 SENSITIVE Sensitive     NITROFURANTOIN 128 RESISTANT Resistant     TRIMETH/SULFA <=20 SENSITIVE Sensitive     AMPICILLIN/SULBACTAM  <=2 SENSITIVE Sensitive     PIP/TAZO <=4 SENSITIVE Sensitive     * >=100,000 COLONIES/mL PROTEUS MIRABILIS  Blood Culture ID Panel (Reflexed)     Status: Abnormal   Collection Time: 10/22/2019 12:39 PM  Result Value Ref Range Status   Enterococcus species NOT DETECTED NOT DETECTED Final   Listeria monocytogenes NOT DETECTED NOT DETECTED Final   Staphylococcus species NOT DETECTED NOT DETECTED Final   Staphylococcus aureus (BCID) NOT DETECTED NOT DETECTED Final   Streptococcus species NOT DETECTED NOT DETECTED Final   Streptococcus agalactiae NOT DETECTED NOT DETECTED Final   Streptococcus pneumoniae NOT DETECTED NOT DETECTED Final   Streptococcus pyogenes NOT DETECTED NOT DETECTED Final   Acinetobacter baumannii NOT DETECTED NOT DETECTED Final   Enterobacteriaceae species DETECTED (A) NOT DETECTED Final    Comment: Enterobacteriaceae represent a large family of gram-negative bacteria, not a single organism. CRITICAL RESULT CALLED TO, READ BACK BY AND VERIFIED WITH: J GRIMSLEY PHARMD 11/06/19 0123 JDW    Enterobacter cloacae complex NOT DETECTED NOT DETECTED Final   Escherichia coli NOT DETECTED NOT DETECTED Final   Klebsiella oxytoca NOT DETECTED NOT DETECTED Final   Klebsiella pneumoniae DETECTED (A) NOT DETECTED Final    Comment: CRITICAL RESULT CALLED TO, READ BACK BY AND VERIFIED WITH: Lavell Luster [facility] [place] 11/06/19 0123 JDW    Proteus species NOT DETECTED NOT DETECTED Final   Serratia marcescens  NOT DETECTED NOT DETECTED Final   Carbapenem resistance NOT DETECTED NOT DETECTED Final   Haemophilus influenzae NOT DETECTED NOT DETECTED Final   Neisseria meningitidis NOT DETECTED NOT DETECTED Final   Pseudomonas aeruginosa NOT DETECTED NOT DETECTED Final   Candida albicans NOT DETECTED NOT DETECTED Final   Candida glabrata NOT DETECTED NOT DETECTED Final   Candida krusei NOT DETECTED NOT DETECTED Final   Candida parapsilosis NOT DETECTED NOT DETECTED Final   Candida tropicalis NOT  DETECTED NOT DETECTED Final    Comment: Performed at [facility], [place] [address], [place], [place] [address]  Blood Culture (routine x 2)     Status: Abnormal   Collection Time: 10/16/2019  1:05 PM   Specimen: BLOOD  Result Value Ref Range Status   Specimen Description   Final    BLOOD RIGHT HAND Performed at [facility] [address] [place], [place] [address]    Special Requests   Final    BOTTLES DRAWN AEROBIC AND ANAEROBIC Blood Culture adequate volume Performed at [facility] [address], [place], [place] [address]    Culture  Setup Time   Final    IN BOTH AEROBIC AND ANAEROBIC BOTTLES GRAM NEGATIVE RODS CRITICAL VALUE NOTED.  VALUE IS CONSISTENT WITH PREVIOUSLY REPORTED AND CALLED VALUE. Performed at [facility], [place] [address], [place], [place] [address]    Culture (A)  Final    PROTEUS MIRABILIS KLEBSIELLA PNEUMONIAE CRITICAL RESULT CALLED TO, READ BACK BY AND VERIFIED WITH: PHARMD MEREDITH R.  [id number] [facility] SUSCEPTIBILITIES PERFORMED ON PREVIOUS CULTURE WITHIN THE LAST 5 DAYS. FOR [place] KLEBSIELLA PNEUMONIAE    Report Status 12-06-19 FINAL  Final   Organism ID, Bacteria PROTEUS MIRABILIS  Final      Susceptibility   Proteus mirabilis - MIC*    AMPICILLIN <=2 SENSITIVE Sensitive     CEFAZOLIN <=4 SENSITIVE Sensitive     CEFEPIME <=0.12 SENSITIVE Sensitive     CEFTAZIDIME <=1 SENSITIVE Sensitive     CEFTRIAXONE <=0.25 SENSITIVE Sensitive     CIPROFLOXACIN <=0.25 SENSITIVE Sensitive     GENTAMICIN <=1 SENSITIVE Sensitive     IMIPENEM 1 SENSITIVE Sensitive     TRIMETH/SULFA <=20 SENSITIVE Sensitive     AMPICILLIN/SULBACTAM <=2 SENSITIVE Sensitive     PIP/TAZO <=4 SENSITIVE Sensitive     * PROTEUS MIRABILIS  Respiratory Panel by RT PCR (Flu A&B, Covid) - Nasopharyngeal Swab     Status: None   Collection Time: 11/06/2019  2:28 PM   Specimen: Nasopharyngeal Swab  Result Value Ref Range Status   SARS Coronavirus  2 by RT PCR NEGATIVE NEGATIVE Final    Comment: (NOTE) SARS-CoV-2 target nucleic acids are NOT DETECTED. The SARS-CoV-2 RNA is generally detectable in upper respiratoy specimens during the acute phase of infection. The lowest concentration of SARS-CoV-2 viral copies this assay can detect is 131 copies/mL. A negative result does not preclude SARS-Cov-2 infection and should not be used as the sole basis for treatment or other patient management decisions. A negative result may occur with  improper specimen collection/handling, submission of specimen other than nasopharyngeal swab, presence of viral mutation(s) within the areas targeted by this assay, and inadequate number of viral copies (<131 copies/mL). A negative result must be combined with clinical observations, patient history, and epidemiological information. The expected result is Negative. Fact Sheet for Patients:  PinkCheek.be Fact Sheet for Healthcare Providers:  GravelBags.it This test is not yet ap proved  or cleared by the [place] and  has been authorized for detection and/or diagnosis of SARS-CoV-2 by FDA under an Emergency Use Authorization (EUA). This EUA will remain  in effect (meaning this test can be used) for the duration of the COVID-19 declaration under Section 564(b)(1) of the Act, 21 U.S.C. section 360bbb-3(b)(1), unless the authorization is terminated or revoked sooner.    Influenza A by PCR NEGATIVE NEGATIVE Final   Influenza B by PCR NEGATIVE NEGATIVE Final    Comment: (NOTE) The Xpert Xpress SARS-CoV-2/FLU/RSV assay is intended as an aid in  the diagnosis of influenza from Nasopharyngeal swab specimens and  should not be used as a sole basis for treatment. Nasal washings and  aspirates are unacceptable for Xpert Xpress SARS-CoV-2/FLU/RSV  testing. Fact Sheet for Patients: PinkCheek.be Fact Sheet for Healthcare  Providers: GravelBags.it This test is not yet approved or cleared by the [place] FDA and  has been authorized for detection and/or diagnosis of SARS-CoV-2 by  FDA under an Emergency Use Authorization (EUA). This EUA will remain  in effect (meaning this test can be used) for the duration of the  Covid-19 declaration under Section 564(b)(1) of the Act, 21  U.S.C. section 360bbb-3(b)(1), unless the authorization is  terminated or revoked. Performed at [facility], [place] [address] [place], [place] [address]   MRSA PCR Screening     Status: Abnormal   Collection Time: 10/30/2019  6:48 PM   Specimen: Nasal Mucosa; Nasopharyngeal  Result Value Ref Range Status   MRSA by PCR POSITIVE (A) NEGATIVE Final    Comment:        The GeneXpert MRSA Assay (FDA approved for NASAL specimens only), is one component of a comprehensive MRSA colonization surveillance program. It is not intended to diagnose MRSA infection nor to guide or monitor treatment for MRSA infections. RESULT CALLED TO, READ BACK BY AND VERIFIED WITH: M.WATSON AT 2301 ON 11/12/2019 BY N.THOMPSON Performed at [facility], [place] [address], [place], [place] [address]     Lab Basic Metabolic Panel: Recent Labs  Lab 11/12/2019 1239 10/28/2019 2029  NA 145 145  K 3.4* 3.9  CL 107 117*  CO2 21* 18*  GLUCOSE 116* 148*  BUN 48* 44*  CREATININE 3.03* 2.53*  CALCIUM 8.7* 7.1*   Liver Function Tests: Recent Labs  Lab 10/26/2019 1239  AST 28  ALT 17  ALKPHOS 77  BILITOT 0.8  PROT 6.6  ALBUMIN 3.4*   Recent Labs  Lab 11/12/2019 1239  LIPASE 16   No results for input(s): AMMONIA in the last 168 hours. CBC: Recent Labs  Lab 11/04/2019 1239 11/11/2019 2029  WBC 20.8* 17.2*  NEUTROABS 19.2* 15.7*  HGB 9.3* 6.6*  HCT 32.3* 22.7*  MCV 106.3* 103.7*  PLT 158 127*   Cardiac Enzymes: No results for input(s): CKTOTAL, CKMB, CKMBINDEX, TROPONINI in the last  168 hours. Sepsis Labs: Recent Labs  Lab 11/12/2019 1239 11/12/2019 2029  WBC 20.8* 17.2*  LATICACIDVEN 1.9 1.6     Dessa Phi D.O.  11/09/2019, 8:05 AM

## 2019-11-15 DEATH — deceased

## 2020-01-30 IMAGING — DX DG PELVIS 1-2V
1 series · 1 of 1 positions shown · non-contrast
Comparison: None.

CLINICAL DATA: Fall

EXAM:
PELVIS - 1-2 VIEW

[pelvis ap]
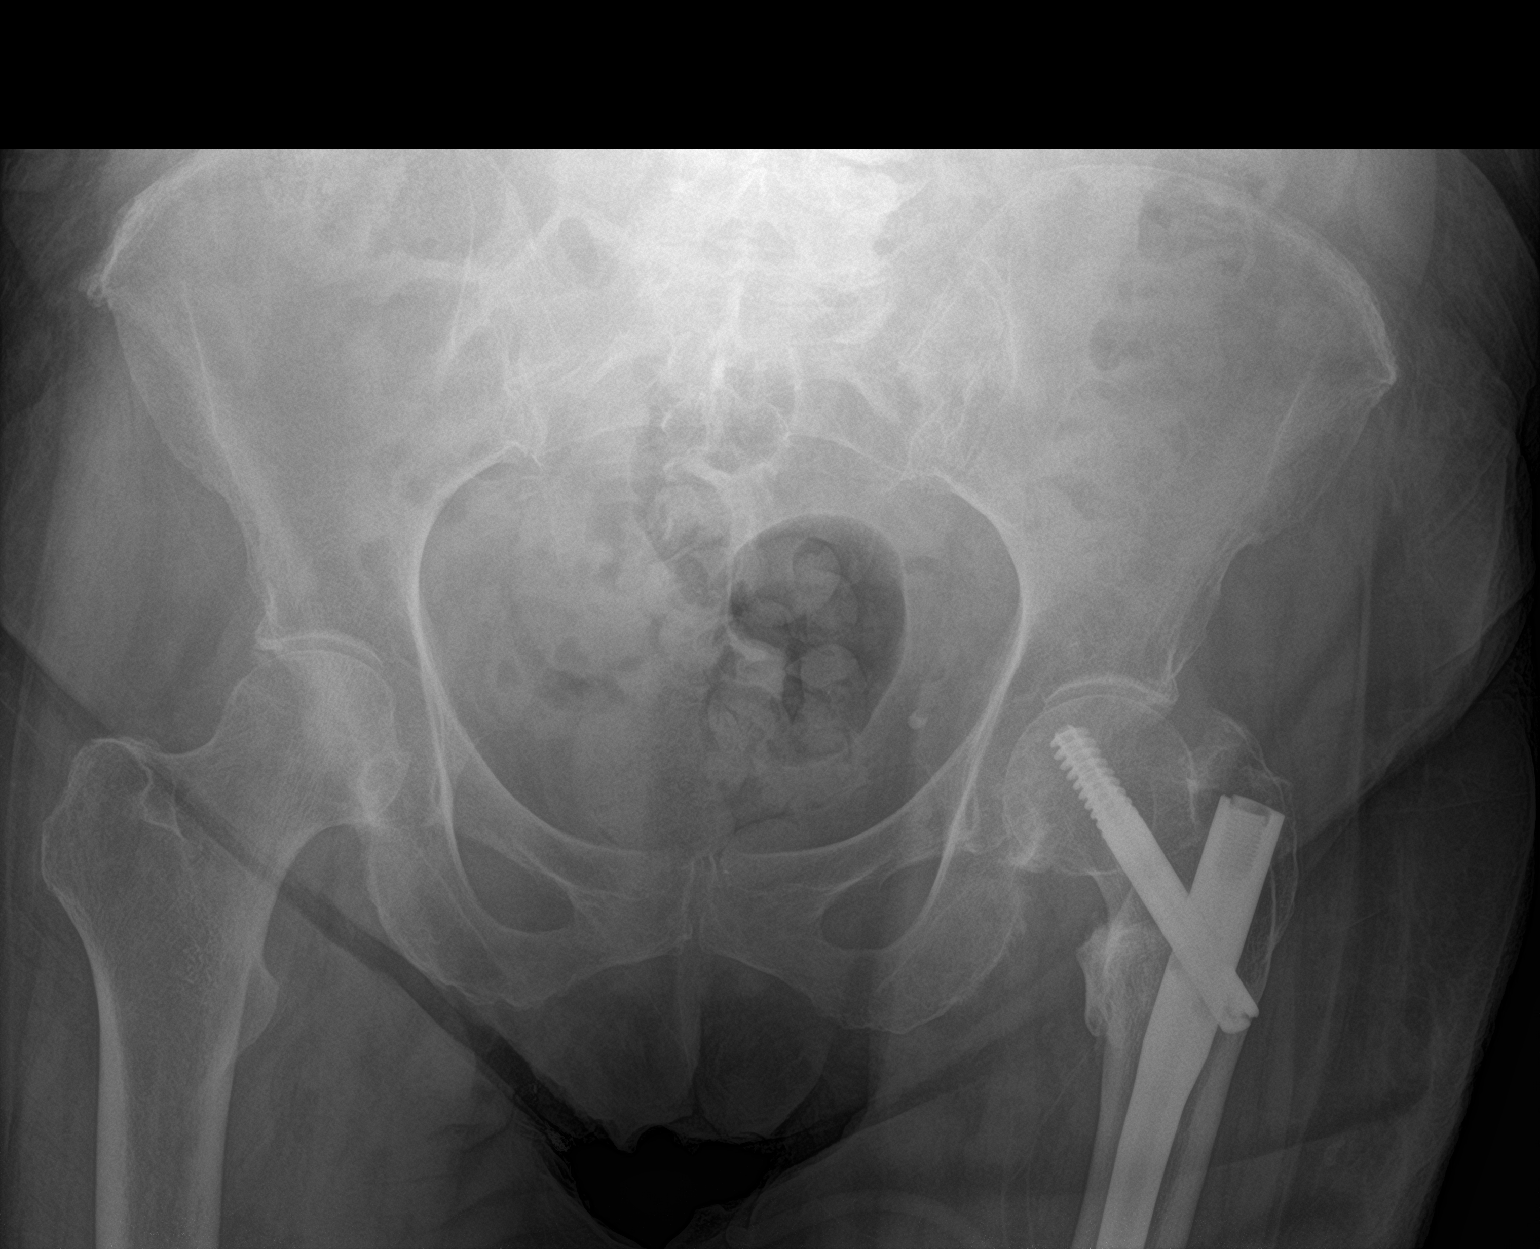

[1 of 1 positions shown; findings below may reference images not displayed]

FINDINGS: Prior internal fixation within the left proximal femur. No acute
fracture, subluxation or dislocation. Hips and SI joints symmetric
and unremarkable. Degenerative changes in the visualized lower
lumbar spine.
IMPRESSION: No acute bony abnormality.

## 2020-01-30 IMAGING — CT CT HEAD W/O CM
4 series · 16 of 47 positions shown, 18 images · non-contrast
Comparison: 07/17/2018

CLINICAL DATA: Fall from wheelchair, hit face. Laceration to left
side of nose and left orbit

EXAM:
CT HEAD WITHOUT CONTRAST
CT MAXILLOFACIAL WITHOUT CONTRAST
CT CERVICAL SPINE WITHOUT CONTRAST
TECHNIQUE: Multidetector CT imaging of the head, cervical spine, and
maxillofacial structures were performed using the standard protocol
without intravenous contrast. Multiplanar CT image reconstructions
of the cervical spine and maxillofacial structures were also
generated.

[Series 3: head without · axial · non-contrast · 0.43mm/px · z∈[-116,+14]mm · 7 of 36 slices shown, 9 images]
[im 5/36  brain]
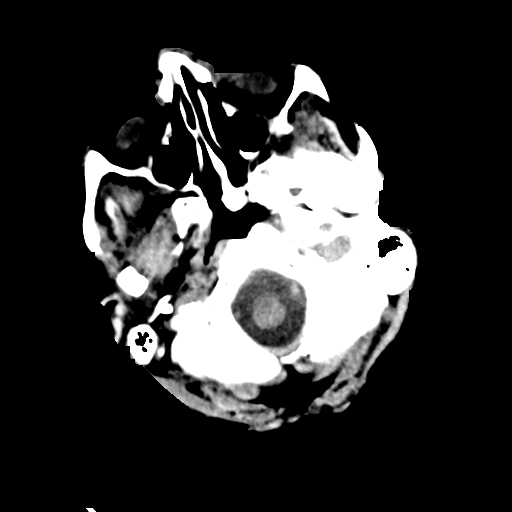
[im 5/36  bone]
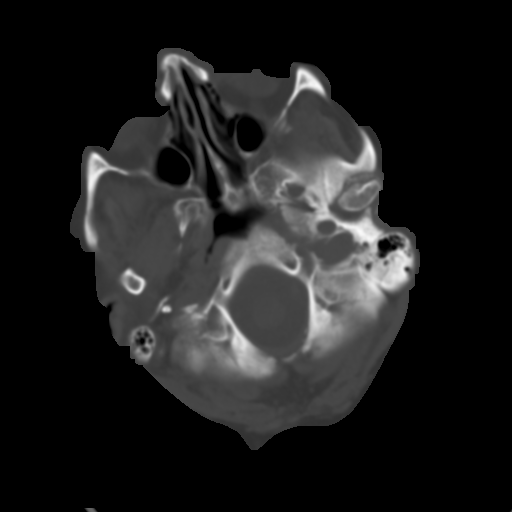
[im 9/36  brain]
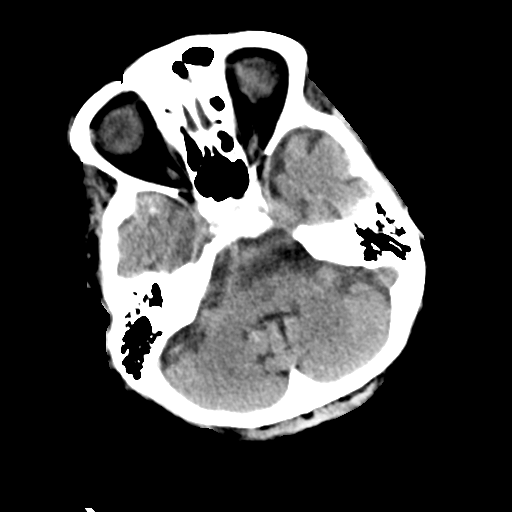
[im 14/36  brain]
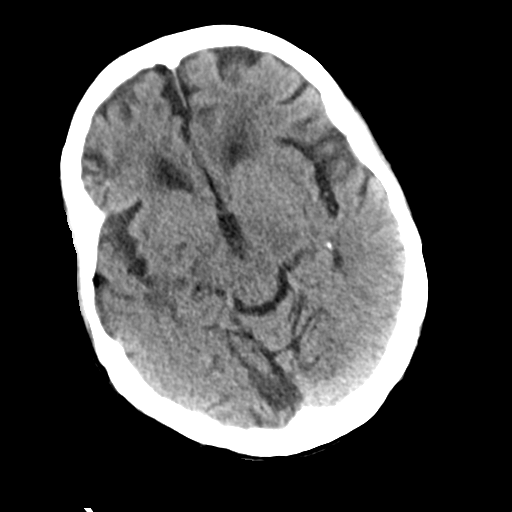
[im 18/36  brain]
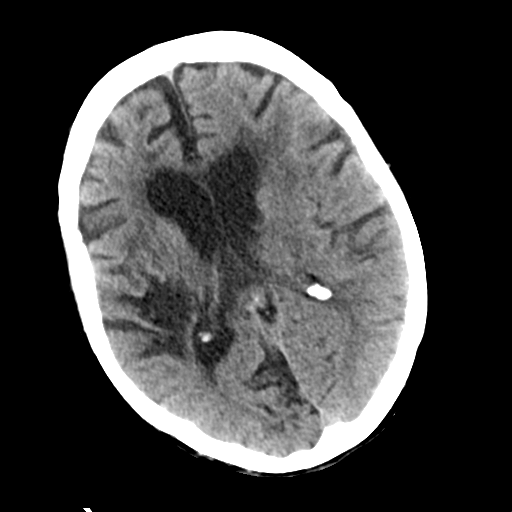
[im 22/36  brain]
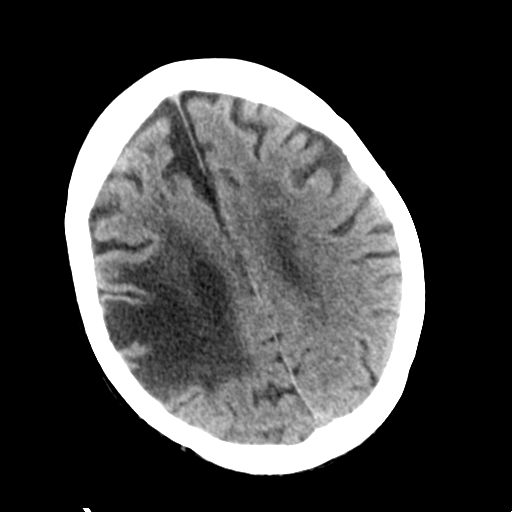
[im 22/36  bone]
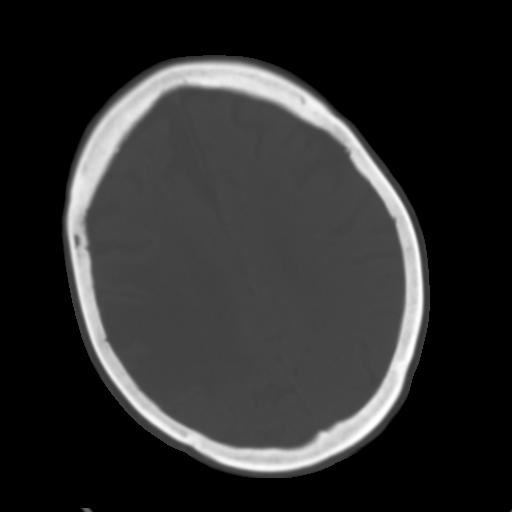
[im 27/36  brain]
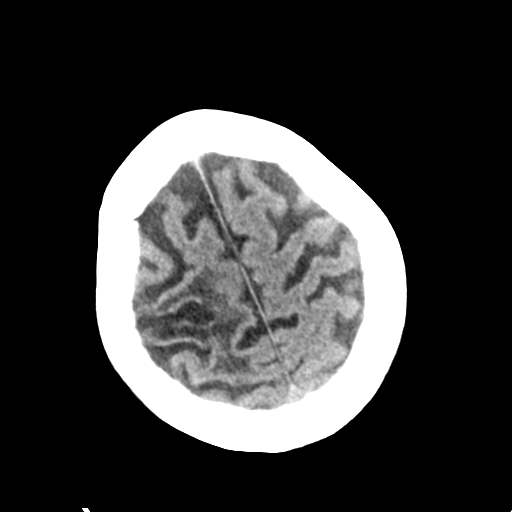
[im 31/36  brain]
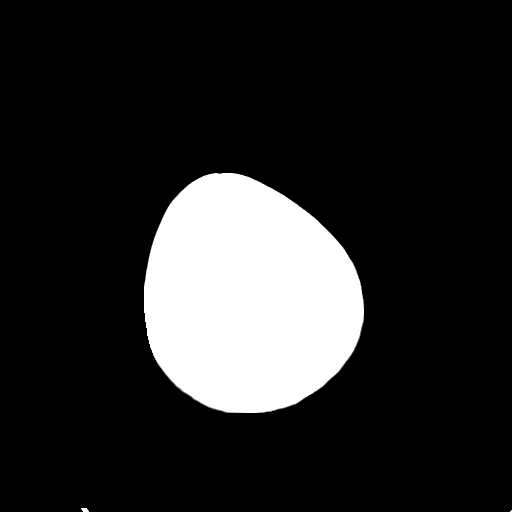

[Series 4: head bone · axial · 0.43mm/px · z∈[-120,-84]mm · 3 of 88 slices shown]
[im 9/88  bone]
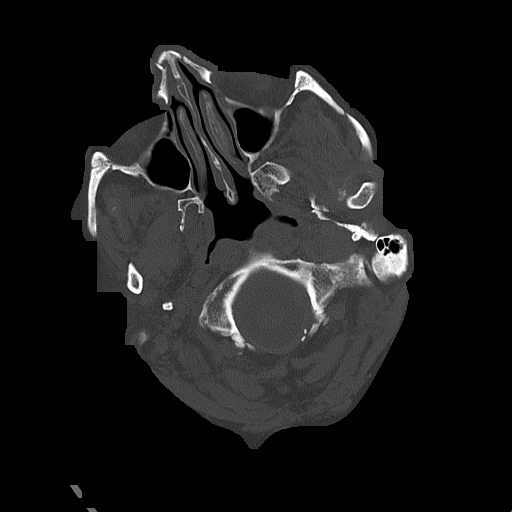
[im 18/88  bone]
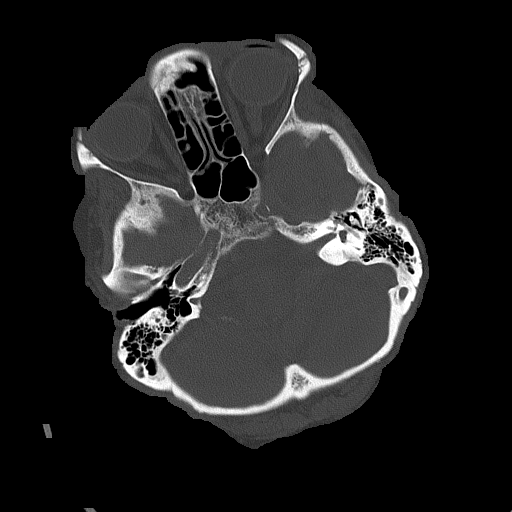
[im 27/88  bone]
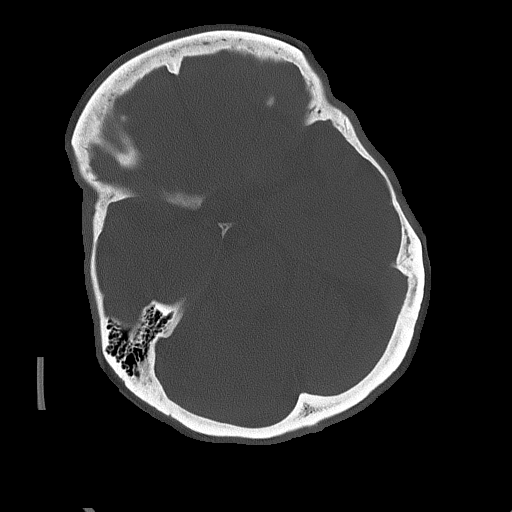

[Series 5: head without cor · coronal · non-contrast · 0.34mm/px · 3 of 71 slices shown]
[im 24/71  brain]
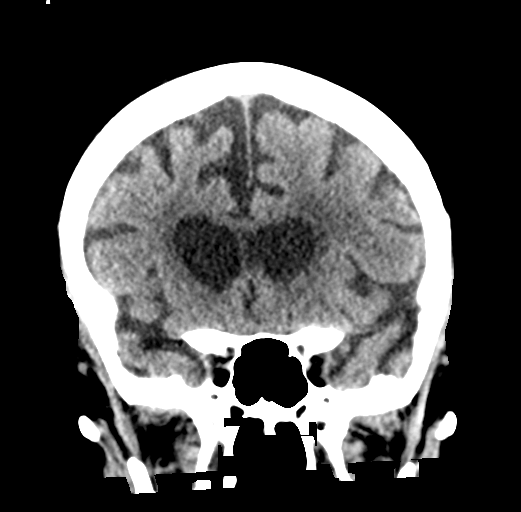
[im 32/71  brain]
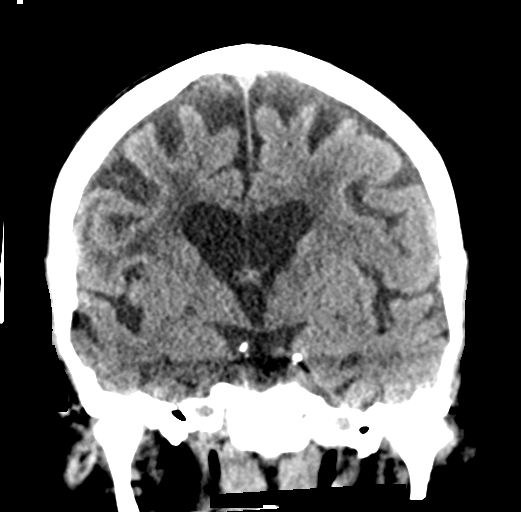
[im 39/71  brain]
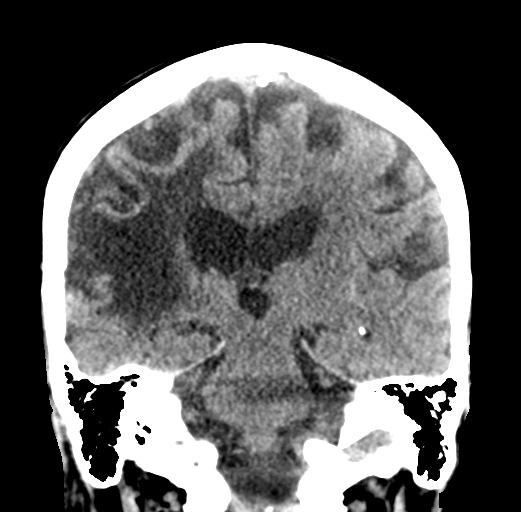

[Series 6: head without sag · sagittal · non-contrast · 0.36mm/px · 3 of 62 slices shown]
[im 21/62  brain]
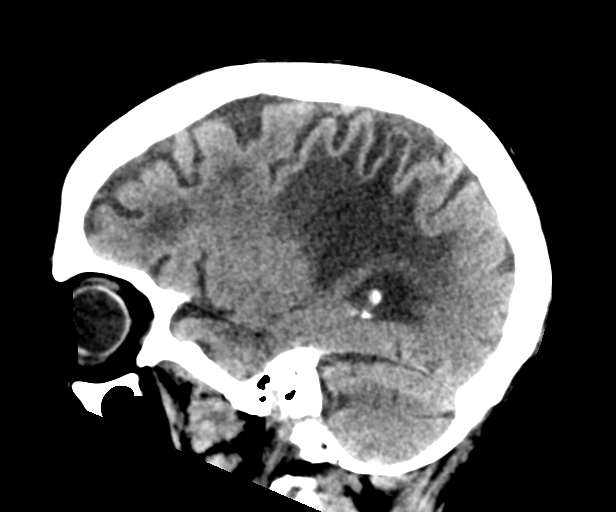
[im 31/62  brain]
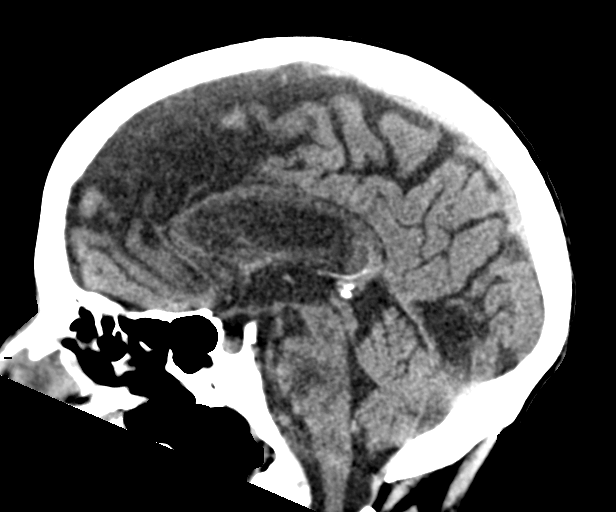
[im 41/62  brain]
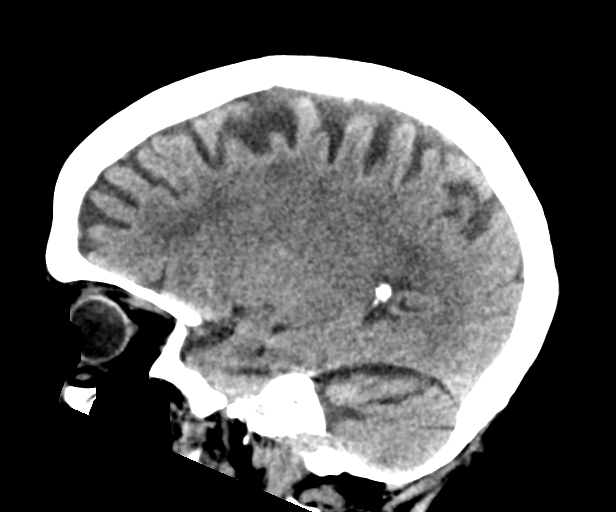

[16 of 47 positions shown; findings below may reference images not displayed]

FINDINGS: CT HEAD FINDINGS

Brain: Large old right MCA territory infarct with encephalomalacia.
Old infarct in the right occipital lobe as well. There is atrophy
and chronic small vessel disease changes. Small old lacunar infarct
in the right cerebellar hemisphere No acute intracranial
abnormality. Specifically, no hemorrhage, hydrocephalus, mass
lesion, acute infarction, or significant intracranial injury.

Vascular: No hyperdense vessel or unexpected calcification.

Skull: No acute calvarial abnormality.

Other: None

CT MAXILLOFACIAL FINDINGS

Osseous: Fractures through the nasal bones bilaterally with mild
angulation. Nasal septum is midline. Fracture through the left
zygomatic arch. No mandibular fracture.

Orbits: Concern for nondisplaced fracture in the left orbital floor
and inferior orbital rim. No other orbital fracture.

Sinuses: Trace fluid in the left maxillary sinus.

Soft tissues: Soft tissue swelling over the left face and
nose/orbit.

CT CERVICAL SPINE FINDINGS

Alignment: Cervical straightening and slight anterolisthesis of C3
on C4 is stable since prior study from April 2018.

Skull base and vertebrae: No acute fracture. No primary bone lesion
or focal pathologic process.

Soft tissues and spinal canal: No prevertebral fluid or swelling. No
visible canal hematoma.

Disc levels:  Diffuse degenerative disc and facet disease.

Upper chest: No acute findings.

Other: Carotid artery calcifications.
IMPRESSION: Old right MCA and PCA territory infarcts.

Atrophy, chronic microvascular disease.

No acute intracranial abnormality.

Multiple nasal bone fractures bilaterally. Fracture through the left
zygomatic arch and floor/inferior orbital rim of the left orbit.

No acute bony abnormality in the cervical spine.
